# Patient Record
Sex: Male | Born: 1940 | Race: Black or African American | Hispanic: No | Marital: Married | Smoking: Former smoker
Health system: Southern US, Community
[De-identification: ages and names within clinical notes are randomized; demographics above are authoritative.]

## PROBLEM LIST (undated history)

## (undated) DIAGNOSIS — Z87442 Personal history of urinary calculi: Secondary | ICD-10-CM

## (undated) DIAGNOSIS — I10 Essential (primary) hypertension: Secondary | ICD-10-CM

## (undated) DIAGNOSIS — M1990 Unspecified osteoarthritis, unspecified site: Secondary | ICD-10-CM

## (undated) DIAGNOSIS — C801 Malignant (primary) neoplasm, unspecified: Secondary | ICD-10-CM

## (undated) DIAGNOSIS — J189 Pneumonia, unspecified organism: Secondary | ICD-10-CM

## (undated) HISTORY — PX: PROSTATECTOMY: SHX69

---

## 2000-11-24 ENCOUNTER — Other Ambulatory Visit: Admission: RE | Admit: 2000-11-24 | Discharge: 2000-11-24 | Payer: Self-pay | Admitting: *Deleted

## 2015-07-05 ENCOUNTER — Emergency Department (HOSPITAL_BASED_OUTPATIENT_CLINIC_OR_DEPARTMENT_OTHER)
Admission: EM | Admit: 2015-07-05 | Discharge: 2015-07-05 | Disposition: A | Discharge status: Home / Self Care | Payer: Medicare Other | Attending: Emergency Medicine | Admitting: Emergency Medicine

## 2015-07-05 ENCOUNTER — Encounter (HOSPITAL_BASED_OUTPATIENT_CLINIC_OR_DEPARTMENT_OTHER): Payer: Self-pay | Admitting: *Deleted

## 2015-07-05 ENCOUNTER — Emergency Department (HOSPITAL_BASED_OUTPATIENT_CLINIC_OR_DEPARTMENT_OTHER): Payer: Medicare Other

## 2015-07-05 DIAGNOSIS — Z792 Long term (current) use of antibiotics: Secondary | ICD-10-CM | POA: Diagnosis not present

## 2015-07-05 DIAGNOSIS — Z79899 Other long term (current) drug therapy: Secondary | ICD-10-CM | POA: Insufficient documentation

## 2015-07-05 DIAGNOSIS — Z87891 Personal history of nicotine dependence: Secondary | ICD-10-CM | POA: Diagnosis not present

## 2015-07-05 DIAGNOSIS — J9811 Atelectasis: Secondary | ICD-10-CM | POA: Diagnosis not present

## 2015-07-05 DIAGNOSIS — R42 Dizziness and giddiness: Secondary | ICD-10-CM | POA: Diagnosis not present

## 2015-07-05 DIAGNOSIS — J690 Pneumonitis due to inhalation of food and vomit: Secondary | ICD-10-CM | POA: Diagnosis not present

## 2015-07-05 DIAGNOSIS — I10 Essential (primary) hypertension: Secondary | ICD-10-CM | POA: Insufficient documentation

## 2015-07-05 DIAGNOSIS — D72829 Elevated white blood cell count, unspecified: Secondary | ICD-10-CM | POA: Insufficient documentation

## 2015-07-05 DIAGNOSIS — R0602 Shortness of breath: Secondary | ICD-10-CM | POA: Diagnosis present

## 2015-07-05 HISTORY — DX: Essential (primary) hypertension: I10

## 2015-07-05 LAB — CBC WITH DIFFERENTIAL/PLATELET
Basophils Absolute: 0 10*3/uL (ref 0.0–0.1)
Basophils Relative: 0 %
EOS PCT: 0 %
Eosinophils Absolute: 0 10*3/uL (ref 0.0–0.7)
HEMATOCRIT: 43.3 % (ref 39.0–52.0)
Hemoglobin: 14.5 g/dL (ref 13.0–17.0)
Lymphocytes Relative: 13 %
Lymphs Abs: 2.8 10*3/uL (ref 0.7–4.0)
MCH: 27.2 pg (ref 26.0–34.0)
MCHC: 33.5 g/dL (ref 30.0–36.0)
MCV: 81.1 fL (ref 78.0–100.0)
MONO ABS: 1.1 10*3/uL — AB (ref 0.1–1.0)
Monocytes Relative: 5 %
NEUTROS ABS: 17.3 10*3/uL — AB (ref 1.7–7.7)
Neutrophils Relative %: 82 %
Platelets: 284 10*3/uL (ref 150–400)
RBC: 5.34 MIL/uL (ref 4.22–5.81)
RDW: 14.9 % (ref 11.5–15.5)
WBC: 21.2 10*3/uL — AB (ref 4.0–10.5)

## 2015-07-05 LAB — COMPREHENSIVE METABOLIC PANEL
ALT: 71 U/L — AB (ref 17–63)
AST: 50 U/L — AB (ref 15–41)
Albumin: 3.1 g/dL — ABNORMAL LOW (ref 3.5–5.0)
Alkaline Phosphatase: 155 U/L — ABNORMAL HIGH (ref 38–126)
Anion gap: 10 (ref 5–15)
BILIRUBIN TOTAL: 1.1 mg/dL (ref 0.3–1.2)
BUN: 14 mg/dL (ref 6–20)
CHLORIDE: 100 mmol/L — AB (ref 101–111)
CO2: 22 mmol/L (ref 22–32)
CREATININE: 1.41 mg/dL — AB (ref 0.61–1.24)
Calcium: 8.5 mg/dL — ABNORMAL LOW (ref 8.9–10.3)
GFR calc Af Amer: 56 mL/min — ABNORMAL LOW (ref >60)
GFR, EST NON AFRICAN AMERICAN: 48 mL/min — AB (ref >60)
Glucose, Bld: 137 mg/dL — ABNORMAL HIGH (ref 65–99)
Potassium: 4.3 mmol/L (ref 3.5–5.1)
Sodium: 132 mmol/L — ABNORMAL LOW (ref 135–145)
Total Protein: 8.1 g/dL (ref 6.5–8.1)

## 2015-07-05 LAB — URINALYSIS, ROUTINE W REFLEX MICROSCOPIC
Glucose, UA: NEGATIVE mg/dL
KETONES UR: 15 mg/dL — AB
NITRITE: POSITIVE — AB
Protein, ur: >300 mg/dL — AB
Specific Gravity, Urine: 1.031 — ABNORMAL HIGH (ref 1.005–1.030)
pH: 5.5 (ref 5.0–8.0)

## 2015-07-05 LAB — URINE MICROSCOPIC-ADD ON

## 2015-07-05 LAB — I-STAT CG4 LACTIC ACID, ED: LACTIC ACID, VENOUS: 1.91 mmol/L (ref 0.5–2.0)

## 2015-07-05 MED ORDER — SODIUM CHLORIDE 0.9 % IV BOLUS (SEPSIS)
1000.0000 mL | Freq: Once | INTRAVENOUS | Status: AC
Start: 2015-07-05 — End: 2015-07-05
  Administered 2015-07-05: 1000 mL via INTRAVENOUS

## 2015-07-05 MED ORDER — CEFTRIAXONE SODIUM 1 G IJ SOLR
INTRAMUSCULAR | Status: AC
  Filled 2015-07-05: qty 10

## 2015-07-05 MED ORDER — ACETAMINOPHEN 500 MG PO TABS
1000.0000 mg | ORAL_TABLET | Freq: Once | ORAL | Status: DC
  Filled 2015-07-05: qty 2

## 2015-07-05 MED ORDER — DEXTROSE 5 % IV SOLN
1.0000 g | Freq: Once | INTRAVENOUS | Status: AC
  Administered 2015-07-05: 1 g via INTRAVENOUS

## 2015-07-05 MED ORDER — IBUPROFEN 400 MG PO TABS
400.0000 mg | ORAL_TABLET | Freq: Once | ORAL | Status: AC
  Administered 2015-07-05: 400 mg via ORAL
  Filled 2015-07-05: qty 1

## 2015-07-05 MED ORDER — AZITHROMYCIN 250 MG PO TABS
500.0000 mg | ORAL_TABLET | Freq: Once | ORAL | Status: AC
  Administered 2015-07-05: 500 mg via ORAL
  Filled 2015-07-05: qty 2

## 2015-07-05 MED ORDER — SODIUM CHLORIDE 0.9 % IV BOLUS (SEPSIS)
1000.0000 mL | Freq: Once | INTRAVENOUS | Status: AC
  Administered 2015-07-05: 1000 mL via INTRAVENOUS

## 2015-07-05 MED ORDER — ALBUTEROL SULFATE (2.5 MG/3ML) 0.083% IN NEBU
2.5000 mg | INHALATION_SOLUTION | RESPIRATORY_TRACT | Status: DC | PRN
  Administered 2015-07-05: 2.5 mg via RESPIRATORY_TRACT
  Filled 2015-07-05: qty 3

## 2015-07-05 MED ORDER — CEFUROXIME AXETIL 500 MG PO TABS: 500.0000 mg | ORAL_TABLET | Freq: Two times a day (BID) | ORAL | Status: DC

## 2015-07-05 MED ORDER — ACETAMINOPHEN 500 MG PO TABS
1000.0000 mg | ORAL_TABLET | Freq: Once | ORAL | Status: AC
  Administered 2015-07-05: 1000 mg via ORAL
  Filled 2015-07-05: qty 2

## 2015-07-05 NOTE — ED Notes (Signed)
MD at bedside. 

## 2015-07-05 NOTE — ED Provider Notes (Signed)
1:19 AM Assumed care from Dr. Jeneen Rinks, please see their note for full history, physical and decision making until this point. In brief this is a 74 y.o. year old male who presented to the ED tonight with Shortness of Breath; Dizziness; and Fever     74 yo M here with likely pneumonia, started z pack yesterday. Here technically septic but overall appears well. On my exam, has diminished breath sounds, tachycardia, tachypnea. Wbc of 21.2. XR worse than  Yesterday but patient appears comofortable. Will await fluids, abx, breathing treatments and reassess for disposition as patient adamantly wants to go home tonight.   UA with possible infection. Planned to add Ceftin to his antibiotic regimen anyway so that should cover for UTI. Patient with continued improvement in his heart rate and respiratory rate while in the emergency department. Patient and family both state the patient appears and is acting better than arrival. He still wants to go home. Discussed that I would prefer admission secondary to still have a heart rate above 100 and respiratory rate about 20 however he and his family preferred to go home. I discussed going to see his primary doctor tomorrow for recheck of vital signs however if they were closed he could return here.   Discharge instructions, including strict return precautions for new or worsening symptoms, given. Patient verbalized understanding and agreement with the plan as described.   Labs, studies and imaging reviewed by myself and considered in medical decision making if ordered. Imaging interpreted by radiology.  Labs Reviewed  COMPREHENSIVE METABOLIC PANEL - Abnormal; Notable for the following:    Sodium 132 (*)    Chloride 100 (*)    Glucose, Bld 137 (*)    Creatinine, Ser 1.41 (*)    Calcium 8.5 (*)    Albumin 3.1 (*)    AST 50 (*)    ALT 71 (*)    Alkaline Phosphatase 155 (*)    GFR calc non Af Amer 48 (*)    GFR calc Af Amer 56 (*)    All other components within  normal limits  CBC WITH DIFFERENTIAL/PLATELET - Abnormal; Notable for the following:    WBC 21.2 (*)    Neutro Abs 17.3 (*)    Monocytes Absolute 1.1 (*)    All other components within normal limits  URINALYSIS, ROUTINE W REFLEX MICROSCOPIC (NOT AT [facility]) - Abnormal; Notable for the following:    Color, Urine ORANGE (*)    APPearance CLOUDY (*)    Specific Gravity, Urine 1.031 (*)    Hgb urine dipstick LARGE (*)    Bilirubin Urine SMALL (*)    Ketones, ur 15 (*)    Protein, ur >300 (*)    Nitrite POSITIVE (*)    Leukocytes, UA SMALL (*)    All other components within normal limits  URINE MICROSCOPIC-ADD ON - Abnormal; Notable for the following:    Squamous Epithelial / LPF 6-30 (*)    Bacteria, UA FEW (*)    All other components within normal limits  CULTURE, BLOOD (ROUTINE X 2)  CULTURE, BLOOD (ROUTINE X 2)  URINE CULTURE  I-STAT CG4 LACTIC ACID, ED    DG Chest 2 View  Final Result      No Follow-up on file.   Merrily Pew, MD 07/06/15 [phone]

## 2015-07-05 NOTE — ED Notes (Signed)
Patient transported to X-ray via stretcher per tech. 

## 2015-07-05 NOTE — ED Notes (Signed)
MD at bedside examining pt and discussing plan of care with pt and wife.

## 2015-07-05 NOTE — ED Notes (Signed)
Sob and dizziness. He was seen by his MD yesterday for SOB and given Albuterol inhaler.

## 2015-07-05 NOTE — ED Notes (Signed)
MD has spoken with pt and family at length about dispo options.  Family and pt has decided that they want to take pt home and not be admitted. Pt and family are aware that pt can be admitted at this time but they decline that option.  They state they will f/u with pts pmd within 2 days and if pt gets any worse they will take him back to an Ed.

## 2015-07-05 NOTE — ED Notes (Signed)
MD at bedside discussing test results and options for plan of care.

## 2015-07-07 ENCOUNTER — Encounter (HOSPITAL_BASED_OUTPATIENT_CLINIC_OR_DEPARTMENT_OTHER): Payer: Self-pay | Admitting: Emergency Medicine

## 2015-07-07 ENCOUNTER — Emergency Department (HOSPITAL_BASED_OUTPATIENT_CLINIC_OR_DEPARTMENT_OTHER)
Admission: EM | Admit: 2015-07-07 | Discharge: 2015-07-07 | Disposition: A | Discharge status: Home / Self Care | Payer: Medicare Other | Attending: Emergency Medicine | Admitting: Emergency Medicine

## 2015-07-07 DIAGNOSIS — R630 Anorexia: Secondary | ICD-10-CM | POA: Insufficient documentation

## 2015-07-07 DIAGNOSIS — I10 Essential (primary) hypertension: Secondary | ICD-10-CM | POA: Insufficient documentation

## 2015-07-07 DIAGNOSIS — R066 Hiccough: Secondary | ICD-10-CM | POA: Diagnosis not present

## 2015-07-07 DIAGNOSIS — R112 Nausea with vomiting, unspecified: Secondary | ICD-10-CM | POA: Insufficient documentation

## 2015-07-07 DIAGNOSIS — J159 Unspecified bacterial pneumonia: Secondary | ICD-10-CM | POA: Insufficient documentation

## 2015-07-07 DIAGNOSIS — Z792 Long term (current) use of antibiotics: Secondary | ICD-10-CM | POA: Insufficient documentation

## 2015-07-07 DIAGNOSIS — J189 Pneumonia, unspecified organism: Secondary | ICD-10-CM

## 2015-07-07 DIAGNOSIS — R05 Cough: Secondary | ICD-10-CM | POA: Diagnosis present

## 2015-07-07 DIAGNOSIS — Z79899 Other long term (current) drug therapy: Secondary | ICD-10-CM | POA: Diagnosis not present

## 2015-07-07 DIAGNOSIS — Z87891 Personal history of nicotine dependence: Secondary | ICD-10-CM | POA: Insufficient documentation

## 2015-07-07 LAB — URINE CULTURE

## 2015-07-07 NOTE — ED Notes (Signed)
Pt seen last week and told to come back to er for recheck of heart rate and respiratory rate

## 2015-07-07 NOTE — Discharge Instructions (Signed)
Community-Acquired Pneumonia, Adult Pneumonia is an infection of the lungs. One type of pneumonia can happen while a person is in a hospital. A different type can happen when a person is not in a hospital (community-acquired pneumonia). It is easy for this kind to spread from person to person. It can spread to you if you breathe near an infected person who coughs or sneezes. Some symptoms include:  A dry cough.  A wet (productive) cough.  Fever.  Sweating.  Chest pain. HOME CARE  Take over-the-counter and prescription medicines only as told by your doctor.  Only take cough medicine if you are losing sleep.  If you were prescribed an antibiotic medicine, take it as told by your doctor. Do not stop taking the antibiotic even if you start to feel better.  Sleep with your head and neck raised (elevated). You can do this by putting a few pillows under your head, or you can sleep in a recliner.  Do not use tobacco products. These include cigarettes, chewing tobacco, and e-cigarettes. If you need help quitting, ask your doctor.  Drink enough water to keep your pee (urine) clear or pale yellow. A shot (vaccine) can help prevent pneumonia. Shots are often suggested for:  People older than 73 years of age.  People older than 73 years of age:  Who are having cancer treatment.  Who have long-term (chronic) lung disease.  Who have problems with their body's defense system (immune system). You may also prevent pneumonia if you take these actions:  Get the flu (influenza) shot every year.  Go to the dentist as often as told.  Wash your hands often. If soap and water are not available, use hand sanitizer. GET HELP IF:  You have a fever.  You lose sleep because your cough medicine does not help. GET HELP RIGHT AWAY IF:  You are short of breath and it gets worse.  You have more chest pain.  Your sickness gets worse. This is very serious if:  You are an older adult.  Your  body's defense system is weak.  You cough up blood.   This information is not intended to replace advice given to you by your health care provider. Make sure you discuss any questions you have with your health care provider.   Document Released: 01/14/2008 Document Revised: 04/18/2015 Document Reviewed: 11/22/2014 Elsevier Interactive Patient Education 2016 [employer].  Hiccups A hiccup is the result of a sudden shortening of the muscle below your lungs (diaphragm). This movement of your diaphragm causes a sudden inhalation followed by the closing of your vocal cords, which causes the hiccup sound. Most people get the hiccups. Typically, hiccups last only a short amount of time.  There are three types of hiccups:   Benign. These hiccups last less than 48 hours.   Persistent. These hiccups last more than 48 hours, but less than 1 month.   Intractable. These hiccups last more than 1 month.  A hiccup is a reflex. You cannot control reflexes.  HOME CARE INSTRUCTIONS  Watch your hiccups for any changes. The following actions may help to lessen any discomfort that you are feeling:  Eat small meals.   Limit alcohol intake to no more than 1 drink per day for nonpregnant women and 2 drinks per day for men. One drink equals 12 oz of beer, 5 oz of wine, or 1 oz of hard liquor.  Limit drinking carbonated or fizzy drinks, such as soda.  Eat and chew your  food slowly.   Avoid eating or drinking hot or spicy foods and drinks.  Take medicines only as directed by your health care provider.  SEEK MEDICAL CARE IF:   Your hiccups last for more than 48 hours.   Your hiccups do not improve with treatment.  You cannot sleep or eat due to the hiccups.   You have unexpected weight loss due to the hiccups.   You have a fever.   You have trouble breathing or swallowing.   You develop severe pain in your abdomen.  You develop numbness, tingling, or weakness.   This information  is not intended to replace advice given to you by your health care provider. Make sure you discuss any questions you have with your health care provider.   Document Released: 10/06/2001 Document Revised: 12/12/2014 Document Reviewed: 07/24/2014 Elsevier Interactive Patient Education [employer].

## 2015-07-07 NOTE — ED Notes (Addendum)
Per pt's family( wife and daughter) they are concern that he is not eating enough , has upset stomach and loose stools. Pt laying in bed with no distress at present time, reports only having hiccups.  Was started on antibiotics when last here and reports that they are trying to encourage him to eat. Reports back to ED because the EDP that seen him requested that he come back for reevaluation of his HR, RR.

## 2015-07-07 NOTE — ED Provider Notes (Signed)
CSN: [id number]     Arrival date & time 07/07/15  1117 History   First MD Initiated Contact with Patient 07/07/15 1156     Chief Complaint  Patient presents with  . Follow-up     (Consider location/radiation/quality/duration/timing/severity/associated sxs/prior Treatment) HPI Comments: Improving cough, SOB--much improved from prior No congestion No CP No fevers since returning home Nausea, decreased appetite, loose stool Taking cefitin, azithromycin Occasional emesis, from coughing Hiccups several days, off and on    Past Medical History  Diagnosis Date  . Hypertension    Past Surgical History  Procedure Laterality Date  . Prostatectomy     History reviewed. No pertinent family history. Social History  Substance Use Topics  . Smoking status: Former Research scientist (life sciences)  . Smokeless tobacco: None  . Alcohol Use: No    Review of Systems  Constitutional: Positive for appetite change. Negative for fever.  HENT: Negative for sore throat.   Eyes: Negative for visual disturbance.  Respiratory: Positive for cough and shortness of breath (mild, much improved ).   Cardiovascular: Negative for chest pain.  Gastrointestinal: Positive for nausea and vomiting (with coughing however now that coughing has decreased, not occuring). Negative for abdominal pain and diarrhea (loose stool).  Genitourinary: Negative for difficulty urinating.  Musculoskeletal: Negative for back pain and neck stiffness.  Skin: Negative for rash.  Neurological: Negative for syncope and headaches.      Allergies  Review of patient's allergies indicates no known allergies.  Home Medications   Prior to Admission medications   Medication Sig Start Date End Date Taking? Authorizing Provider  ALBUTEROL IN Inhale into the lungs.   Yes Historical Provider, MD  cefUROXime (CEFTIN) 500 MG tablet Take 1 tablet (500 mg total) by mouth 2 (two) times daily with a meal. 07/06/15  Yes Merrily Pew, MD  azithromycin  (ZITHROMAX) 250 MG tablet Take by mouth daily.    Historical Provider, MD  QUINAPRIL HCL PO Take by mouth.    Historical Provider, MD   BP 130/88 mmHg  Pulse 112  Temp(Src) 98.4 F (36.9 C) (Oral)  Resp 20  Ht 5\' 11"  (1.803 m)  Wt 268 lb (121.564 kg)  BMI 37.39 kg/m2  SpO2 96% Physical Exam  Constitutional: He is oriented to person, place, and time. He appears well-developed and well-nourished. No distress.  hiccups  HENT:  Head: Normocephalic and atraumatic.  Eyes: Conjunctivae and EOM are normal.  Neck: Normal range of motion.  Cardiovascular: Normal rate, regular rhythm, normal heart sounds and intact distal pulses.  Exam reveals no gallop and no friction rub.   No murmur heard. Pulmonary/Chest: Effort normal and breath sounds normal. No respiratory distress. He has no wheezes. He has no rales.  Abdominal: Soft. He exhibits no distension. There is no tenderness. There is no guarding.  Musculoskeletal: He exhibits no edema.  Neurological: He is alert and oriented to person, place, and time.  Skin: Skin is warm and dry. He is not diaphoretic.  Nursing note and vitals reviewed.   ED Course  Procedures (including critical care time) Labs Review Labs Reviewed - No data to display  Imaging Review No results found. I have personally reviewed and evaluated these images and lab results as part of my medical decision-making.   EKG Interpretation None      MDM   Final diagnoses:  CAP (community acquired pneumonia)  Hiccups   74yo male with hx of htn, recent ED evaluation 11/24 with concern for cough, dyspnea, leukocytosis, likely pneumonia and  ceftin added to azithromycin. Pt with tachycardia and tachypnea however wanted to be discharged, and EDP recommended PCP follow up or return to ED.  Patient overall feels he is improving, with improving SOB, cough.  He has some nausea, loose stool, likely secondary to abx and decreased appetite. Tachycardia today likely secondary to  mild dehydration from decreased appetite.  Given leukocytosis, improvement of symptoms with abx, feel hx is more consistent with infxn than other cause of dyspnea (PE/ACS.)  Pt with hiccups, recommend continued conservative measures.  Given improvement with abx, do not feel pt requires additional labwork or imaging today. Patient discharged in stable condition with understanding of reasons to return.     Gareth Morgan, MD 07/07/15 2211

## 2015-07-10 LAB — CULTURE, BLOOD (ROUTINE X 2)
CULTURE: NO GROWTH
Culture: NO GROWTH

## 2015-07-26 NOTE — ED Provider Notes (Addendum)
CSN: [id number]     Arrival date & time 07/05/15  1409 History   First MD Initiated Contact with Patient 07/05/15 1425     Chief Complaint  Patient presents with  . Shortness of Breath  . Dizziness  . Fever    HPI  Patient presents for evaluation with a complaint of shortness of breath over the last several days. Was placed on Zovirax by his primary care physician 1 day prior. Continues with cough shortness of breath and fever at home. No chest pain. No nausea or vomiting. Claims a generalized weakness.  Past Medical History  Diagnosis Date  . Hypertension    Past Surgical History  Procedure Laterality Date  . Prostatectomy     No family history on file. Social History  Substance Use Topics  . Smoking status: Former Research scientist (life sciences)  . Smokeless tobacco: None  . Alcohol Use: No    Review of Systems  Constitutional: Positive for fever and fatigue. Negative for chills, diaphoresis and appetite change.  HENT: Negative for mouth sores, sore throat and trouble swallowing.   Eyes: Negative for visual disturbance.  Respiratory: Positive for cough and shortness of breath. Negative for chest tightness and wheezing.   Cardiovascular: Negative for chest pain.  Gastrointestinal: Negative for nausea, vomiting, abdominal pain, diarrhea and abdominal distention.  Endocrine: Negative for polydipsia, polyphagia and polyuria.  Genitourinary: Negative for dysuria, frequency and hematuria.  Musculoskeletal: Negative for gait problem.  Skin: Negative for color change, pallor and rash.  Neurological: Negative for dizziness, syncope, light-headedness and headaches.  Hematological: Does not bruise/bleed easily.  Psychiatric/Behavioral: Negative for behavioral problems and confusion.      Allergies  Review of patient's allergies indicates no known allergies.  Home Medications   Prior to Admission medications   Medication Sig Start Date End Date Taking? Authorizing Provider  ALBUTEROL IN Inhale  into the lungs.   Yes Historical Provider, MD  azithromycin (ZITHROMAX) 250 MG tablet Take by mouth daily.   Yes Historical Provider, MD  QUINAPRIL HCL PO Take by mouth.   Yes Historical Provider, MD  cefUROXime (CEFTIN) 500 MG tablet Take 1 tablet (500 mg total) by mouth 2 (two) times daily with a meal. 07/06/15   Merrily Pew, MD   BP 122/81 mmHg  Pulse 110  Temp(Src) 99.6 F (37.6 C) (Oral)  Resp 28  Ht 5' 11.5" (1.816 m)  Wt 268 lb (121.564 kg)  BMI 36.86 kg/m2  SpO2 96% Physical Exam  Constitutional: He is oriented to person, place, and time. He appears well-developed and well-nourished. No distress.  HENT:  Head: Normocephalic.  Eyes: Conjunctivae are normal. Pupils are equal, round, and reactive to light. No scleral icterus.  Neck: Normal range of motion. Neck supple. No thyromegaly present.  Cardiovascular: Normal rate and regular rhythm.  Exam reveals no gallop and no friction rub.   No murmur heard. Pulmonary/Chest: Effort normal and breath sounds normal. No respiratory distress. He has no wheezes. He has no rales.  Globally rhonchorous on room air exam. No wheezing or prolongation.  Abdominal: Soft. Bowel sounds are normal. He exhibits no distension. There is no tenderness. There is no rebound.  Musculoskeletal: Normal range of motion.  Neurological: He is alert and oriented to person, place, and time.  Skin: Skin is warm and dry. No rash noted.  Psychiatric: He has a normal mood and affect. His behavior is normal.    ED Course  Procedures (including critical care time) Labs Review Labs Reviewed  COMPREHENSIVE METABOLIC  PANEL - Abnormal; Notable for the following:    Sodium 132 (*)    Chloride 100 (*)    Glucose, Bld 137 (*)    Creatinine, Ser 1.41 (*)    Calcium 8.5 (*)    Albumin 3.1 (*)    AST 50 (*)    ALT 71 (*)    Alkaline Phosphatase 155 (*)    GFR calc non Af Amer 48 (*)    GFR calc Af Amer 56 (*)    All other components within normal limits  CBC  WITH DIFFERENTIAL/PLATELET - Abnormal; Notable for the following:    WBC 21.2 (*)    Neutro Abs 17.3 (*)    Monocytes Absolute 1.1 (*)    All other components within normal limits  URINALYSIS, ROUTINE W REFLEX MICROSCOPIC (NOT AT [facility]) - Abnormal; Notable for the following:    Color, Urine ORANGE (*)    APPearance CLOUDY (*)    Specific Gravity, Urine 1.031 (*)    Hgb urine dipstick LARGE (*)    Bilirubin Urine SMALL (*)    Ketones, ur 15 (*)    Protein, ur >300 (*)    Nitrite POSITIVE (*)    Leukocytes, UA SMALL (*)    All other components within normal limits  URINE MICROSCOPIC-ADD ON - Abnormal; Notable for the following:    Squamous Epithelial / LPF 6-30 (*)    Bacteria, UA FEW (*)    All other components within normal limits  CULTURE, BLOOD (ROUTINE X 2)  CULTURE, BLOOD (ROUTINE X 2)  URINE CULTURE  I-STAT CG4 LACTIC ACID, ED    Imaging Review No results found. I have personally reviewed and evaluated these images and lab results as part of my medical decision-making.   EKG Interpretation   Date/Time:  Thursday July 05 2015 14:14:31 EST Ventricular Rate:  132 PR Interval:  142 QRS Duration: 70 QT Interval:  280 QTC Calculation: 414 R Axis:   3 Text Interpretation:  Sinus tachycardia with occasional Premature  ventricular complexes Low voltage QRS Borderline ECG ED PHYSICIAN  INTERPRETATION AVAILABLE IN [facility] Confirmed by TEST, Record  ([id number]) on 07/06/2015 7:20:11 AM      MDM   Final diagnoses:  Aspiration pneumonia, unspecified aspiration pneumonia type, unspecified laterality, unspecified part of lung ([place])    Patient with bibasilar atelectasis. Leukocytosis. Urine with signs of infection. Please see Dr. Evelina Dun note regarding ultimate disposition of the patient. Patient and family have expressed a strong desire to not be admitted to the hospital.    Tanna Furry, MD 07/26/15 [place], MD 07/28/15 [phone]

## 2015-08-21 ENCOUNTER — Ambulatory Visit: Payer: Self-pay | Admitting: Orthopedic Surgery

## 2015-08-21 NOTE — Progress Notes (Signed)
Preoperative surgical orders have been place into the Epic hospital system for Marylouise Stacks on 08/21/2015, 9:35 PM  by Mickel Crow for surgery on 09/10/2015.  Preop Total Knee orders including Experal, IV Tylenol, and IV Decadron as long as there are no contraindications to the above medications. Arlee Muslim, PA-C

## 2015-08-30 NOTE — Patient Instructions (Signed)
Hadley Pen  08/30/2015   Your procedure is scheduled on: 09/10/2015    Report to [facility] Main  Entrance take [facility]  elevators to 3rd floor to  [place] at    1150AM.  Call this number if you have problems the morning of surgery [phone]   Remember: ONLY 1 PERSON MAY GO WITH YOU TO SHORT STAY TO GET  READY MORNING OF YOUR SURGERY.  Do not eat food after midnite.  May have clear liquids from 12 midnite until 0730am then nothing by mouth.       Take these medicines the morning of surgery with A SIP OF WATER: Albuterol Inhaler if needed and bring, Amlodipine ( Norvasc)                                You may not have any metal on your body including hair pins and              piercings  Do not wear jewelry, , lotions, powders or perfumes, deodorant                        Men may shave face and neck.   Do not bring valuables to the hospital. [place].  Contacts, dentures or bridgework may not be worn into surgery.  Leave suitcase in the car. After surgery it may be brought to your room.     Special Instructions: coughing and deep breathing exercises, leg exercises               Please read over the following fact sheets you were given: _____________________________________________________________________             [facility] - Preparing for Surgery Before surgery, you can play an important role.  Because skin is not sterile, your skin needs to be as free of germs as possible.  You can reduce the number of germs on your skin by washing with CHG (chlorahexidine gluconate) soap before surgery.  CHG is an antiseptic cleaner which kills germs and bonds with the skin to continue killing germs even after washing. Please DO NOT use if you have an allergy to CHG or antibacterial soaps.  If your skin becomes reddened/irritated stop using the CHG and inform your nurse when you arrive at Short Stay. Do  not shave (including legs and underarms) for at least 48 hours prior to the first CHG shower.  You may shave your face/neck. Please follow these instructions carefully:  1.  Shower with CHG Soap the night before surgery and the  morning of Surgery.  2.  If you choose to wash your hair, wash your hair first as usual with your  normal  shampoo.  3.  After you shampoo, rinse your hair and body thoroughly to remove the  shampoo.                           4.  Use CHG as you would any other liquid soap.  You can apply chg directly  to the skin and wash  Gently with a scrungie or clean washcloth.  5.  Apply the CHG Soap to your body ONLY FROM THE NECK DOWN.   Do not use on face/ open                           Wound or open sores. Avoid contact with eyes, ears mouth and genitals (private parts).                       Wash face,  Genitals (private parts) with your normal soap.             6.  Wash thoroughly, paying special attention to the area where your surgery  will be performed.  7.  Thoroughly rinse your body with warm water from the neck down.  8.  DO NOT shower/wash with your normal soap after using and rinsing off  the CHG Soap.                9.  Pat yourself dry with a clean towel.            10.  Wear clean pajamas.            11.  Place clean sheets on your bed the night of your first shower and do not  sleep with pets. Day of Surgery : Do not apply any lotions/deodorants the morning of surgery.  Please wear clean clothes to the hospital/surgery center.  FAILURE TO FOLLOW THESE INSTRUCTIONS MAY RESULT IN THE CANCELLATION OF YOUR SURGERY PATIENT SIGNATURE_________________________________  NURSE SIGNATURE__________________________________  ________________________________________________________________________  WHAT IS A BLOOD TRANSFUSION? Blood Transfusion Information  A transfusion is the replacement of blood or some of its parts. Blood is made up of multiple  cells which provide different functions.  Red blood cells carry oxygen and are used for blood loss replacement.  White blood cells fight against infection.  Platelets control bleeding.  Plasma helps clot blood.  Other blood products are available for specialized needs, such as hemophilia or other clotting disorders. BEFORE THE TRANSFUSION  Who gives blood for transfusions?   Healthy volunteers who are fully evaluated to make sure their blood is safe. This is blood bank blood. Transfusion therapy is the safest it has ever been in the practice of medicine. Before blood is taken from a donor, a complete history is taken to make sure that person has no history of diseases nor engages in risky social behavior (examples are intravenous drug use or sexual activity with multiple partners). The donor's travel history is screened to minimize risk of transmitting infections, such as malaria. The donated blood is tested for signs of infectious diseases, such as HIV and hepatitis. The blood is then tested to be sure it is compatible with you in order to minimize the chance of a transfusion reaction. If you or a relative donates blood, this is often done in anticipation of surgery and is not appropriate for emergency situations. It takes many days to process the donated blood. RISKS AND COMPLICATIONS Although transfusion therapy is very safe and saves many lives, the main dangers of transfusion include:  1. Getting an infectious disease. 2. Developing a transfusion reaction. This is an allergic reaction to something in the blood you were given. Every precaution is taken to prevent this. The decision to have a blood transfusion has been considered carefully by your caregiver before blood is given. Blood is not given unless the benefits outweigh  the risks. AFTER THE TRANSFUSION  Right after receiving a blood transfusion, you will usually feel much better and more energetic. This is especially true if your red  blood cells have gotten low (anemic). The transfusion raises the level of the red blood cells which carry oxygen, and this usually causes an energy increase.  The nurse administering the transfusion will monitor you carefully for complications. HOME CARE INSTRUCTIONS  No special instructions are needed after a transfusion. You may find your energy is better. Speak with your caregiver about any limitations on activity for underlying diseases you may have. SEEK MEDICAL CARE IF:   Your condition is not improving after your transfusion.  You develop redness or irritation at the intravenous (IV) site. SEEK IMMEDIATE MEDICAL CARE IF:  Any of the following symptoms occur over the next 12 hours:  Shaking chills.  You have a temperature by mouth above 102 F (38.9 C), not controlled by medicine.  Chest, back, or muscle pain.  People around you feel you are not acting correctly or are confused.  Shortness of breath or difficulty breathing.  Dizziness and fainting.  You get a rash or develop hives.  You have a decrease in urine output.  Your urine turns a dark color or changes to pink, red, or brown. Any of the following symptoms occur over the next 10 days:  You have a temperature by mouth above 102 F (38.9 C), not controlled by medicine.  Shortness of breath.  Weakness after normal activity.  The white part of the eye turns yellow (jaundice).  You have a decrease in the amount of urine or are urinating less often.  Your urine turns a dark color or changes to pink, red, or brown. Document Released: 07/25/2000 Document Revised: 10/20/2011 Document Reviewed: 03/13/2008 ExitCare Patient Information 2014 [place].  _______________________________________________________________________  Incentive Spirometer  An incentive spirometer is a tool that can help keep your lungs clear and active. This tool measures how well you are filling your lungs with each breath. Taking  long deep breaths may help reverse or decrease the chance of developing breathing (pulmonary) problems (especially infection) following:  A long period of time when you are unable to move or be active. BEFORE THE PROCEDURE   If the spirometer includes an indicator to show your best effort, your nurse or respiratory therapist will set it to a desired goal.  If possible, sit up straight or lean slightly forward. Try not to slouch.  Hold the incentive spirometer in an upright position. INSTRUCTIONS FOR USE  3. Sit on the edge of your bed if possible, or sit up as far as you can in bed or on a chair. 4. Hold the incentive spirometer in an upright position. 5. Breathe out normally. 6. Place the mouthpiece in your mouth and seal your lips tightly around it. 7. Breathe in slowly and as deeply as possible, raising the piston or the ball toward the top of the column. 8. Hold your breath for 3-5 seconds or for as long as possible. Allow the piston or ball to fall to the bottom of the column. 9. Remove the mouthpiece from your mouth and breathe out normally. 10. Rest for a few seconds and repeat Steps 1 through 7 at least 10 times every 1-2 hours when you are awake. Take your time and take a few normal breaths between deep breaths. 11. The spirometer may include an indicator to show your best effort. Use the indicator as a goal to work  toward during each repetition. 12. After each set of 10 deep breaths, practice coughing to be sure your lungs are clear. If you have an incision (the cut made at the time of surgery), support your incision when coughing by placing a pillow or rolled up towels firmly against it. Once you are able to get out of bed, walk around indoors and cough well. You may stop using the incentive spirometer when instructed by your caregiver.  RISKS AND COMPLICATIONS  Take your time so you do not get dizzy or light-headed.  If you are in pain, you may need to take or ask for pain  medication before doing incentive spirometry. It is harder to take a deep breath if you are having pain. AFTER USE  Rest and breathe slowly and easily.  It can be helpful to keep track of a log of your progress. Your caregiver can provide you with a simple table to help with this. If you are using the spirometer at home, follow these instructions: [place] IF:   You are having difficultly using the spirometer.  You have trouble using the spirometer as often as instructed.  Your pain medication is not giving enough relief while using the spirometer.  You develop fever of 100.5 F (38.1 C) or higher. SEEK IMMEDIATE MEDICAL CARE IF:   You cough up bloody sputum that had not been present before.  You develop fever of 102 F (38.9 C) or greater.  You develop worsening pain at or near the incision site. MAKE SURE YOU:   Understand these instructions.  Will watch your condition.  Will get help right away if you are not doing well or get worse. Document Released: 12/08/2006 Document Revised: 10/20/2011 Document Reviewed: 02/08/2007 ExitCare Patient Information 2014 ExitCare, [place].   ________________________________________________________________________    CLEAR LIQUID DIET   Foods Allowed                                                                     Foods Excluded  Coffee and tea, regular and decaf                             liquids that you cannot  Plain Jell-O in any flavor                                             see through such as: Fruit ices (not with fruit pulp)                                     milk, soups, orange juice  Iced Popsicles                                    All solid food Carbonated beverages, regular and diet  Cranberry, grape and apple juices Sports drinks like Gatorade Lightly seasoned clear broth or consume(fat free) Sugar, honey syrup  Sample Menu Breakfast                                 Lunch                                     Supper Cranberry juice                    Beef broth                            Chicken broth Jell-O                                     Grape juice                           Apple juice Coffee or tea                        Jell-O                                      Popsicle                                                Coffee or tea                        Coffee or tea  _____________________________________________________________________

## 2015-09-03 ENCOUNTER — Encounter (HOSPITAL_COMMUNITY)
Admission: RE | Admit: 2015-09-03 | Discharge: 2015-09-03 | Disposition: A | Discharge status: Home / Self Care | Payer: Medicare Other | Source: Ambulatory Visit | Attending: Orthopedic Surgery | Admitting: Orthopedic Surgery

## 2015-09-03 ENCOUNTER — Encounter (HOSPITAL_COMMUNITY): Payer: Self-pay

## 2015-09-03 ENCOUNTER — Encounter (INDEPENDENT_AMBULATORY_CARE_PROVIDER_SITE_OTHER): Payer: Self-pay

## 2015-09-03 ENCOUNTER — Ambulatory Visit (HOSPITAL_COMMUNITY)
Admission: RE | Admit: 2015-09-03 | Discharge: 2015-09-03 | Disposition: A | Discharge status: Home / Self Care | Payer: Medicare Other | Source: Ambulatory Visit | Attending: Anesthesiology | Admitting: Anesthesiology

## 2015-09-03 DIAGNOSIS — I10 Essential (primary) hypertension: Secondary | ICD-10-CM | POA: Insufficient documentation

## 2015-09-03 DIAGNOSIS — Z8546 Personal history of malignant neoplasm of prostate: Secondary | ICD-10-CM | POA: Diagnosis not present

## 2015-09-03 DIAGNOSIS — Z01818 Encounter for other preprocedural examination: Secondary | ICD-10-CM | POA: Diagnosis not present

## 2015-09-03 DIAGNOSIS — Z87891 Personal history of nicotine dependence: Secondary | ICD-10-CM | POA: Diagnosis not present

## 2015-09-03 HISTORY — DX: Unspecified osteoarthritis, unspecified site: M19.90

## 2015-09-03 HISTORY — DX: Pneumonia, unspecified organism: J18.9

## 2015-09-03 HISTORY — DX: Malignant (primary) neoplasm, unspecified: C80.1

## 2015-09-03 LAB — CBC
HEMATOCRIT: 42.3 % (ref 39.0–52.0)
Hemoglobin: 13.5 g/dL (ref 13.0–17.0)
MCH: 27.5 pg (ref 26.0–34.0)
MCHC: 31.9 g/dL (ref 30.0–36.0)
MCV: 86.2 fL (ref 78.0–100.0)
PLATELETS: 244 10*3/uL (ref 150–400)
RBC: 4.91 MIL/uL (ref 4.22–5.81)
RDW: 16.2 % — AB (ref 11.5–15.5)
WBC: 5.6 10*3/uL (ref 4.0–10.5)

## 2015-09-03 LAB — URINALYSIS, ROUTINE W REFLEX MICROSCOPIC
BILIRUBIN URINE: NEGATIVE
Glucose, UA: NEGATIVE mg/dL
HGB URINE DIPSTICK: NEGATIVE
KETONES UR: NEGATIVE mg/dL
Leukocytes, UA: NEGATIVE
Nitrite: NEGATIVE
PH: 5 (ref 5.0–8.0)
Protein, ur: NEGATIVE mg/dL
SPECIFIC GRAVITY, URINE: 1.017 (ref 1.005–1.030)

## 2015-09-03 LAB — COMPREHENSIVE METABOLIC PANEL
ALBUMIN: 4.1 g/dL (ref 3.5–5.0)
ALT: 20 U/L (ref 17–63)
ANION GAP: 10 (ref 5–15)
AST: 26 U/L (ref 15–41)
Alkaline Phosphatase: 76 U/L (ref 38–126)
BILIRUBIN TOTAL: 0.4 mg/dL (ref 0.3–1.2)
BUN: 9 mg/dL (ref 6–20)
CHLORIDE: 107 mmol/L (ref 101–111)
CO2: 25 mmol/L (ref 22–32)
Calcium: 9.5 mg/dL (ref 8.9–10.3)
Creatinine, Ser: 1.18 mg/dL (ref 0.61–1.24)
GFR calc Af Amer: >60 mL/min (ref >60)
GFR calc non Af Amer: 59 mL/min — ABNORMAL LOW (ref >60)
GLUCOSE: 124 mg/dL — AB (ref 65–99)
POTASSIUM: 4.5 mmol/L (ref 3.5–5.1)
Sodium: 142 mmol/L (ref 135–145)
TOTAL PROTEIN: 7.7 g/dL (ref 6.5–8.1)

## 2015-09-03 LAB — APTT: APTT: 32 s (ref 24–37)

## 2015-09-03 LAB — PROTIME-INR
INR: 1.05 (ref 0.00–1.49)
PROTHROMBIN TIME: 13.9 s (ref 11.6–15.2)

## 2015-09-03 LAB — ABO/RH: ABO/RH(D): O POS

## 2015-09-03 LAB — SURGICAL PCR SCREEN
MRSA, PCR: NEGATIVE
Staphylococcus aureus: NEGATIVE

## 2015-09-03 NOTE — Progress Notes (Signed)
Clearance- DR Trilby Drummer 07/19/15 on chart  EKG- 07/19/15 on chart  DR Ethelene Hal- 07/19/15- clearance on chart

## 2015-09-09 ENCOUNTER — Ambulatory Visit: Payer: Self-pay | Admitting: Orthopedic Surgery

## 2015-09-09 NOTE — H&P (Signed)
Seth [name] DOB: [birth year] Married / Language: English / Race: Black or African American Male Date of Admission:  09/10/2015 CC:  Right Knee Pain History of Present Illness The patient is a 75 year old male who comes in for a preoperative History and Physical. The patient is scheduled for a right total knee arthroplasty to be performed by Dr. Dione Plover. Aluisio, MD at [facility] on 09-10-15. The patient is a 75 year old male who presented for follow up of their knee. The patient is being followed for their right knee pain and osteoarthritis. They are several month(s) out from last cortisone injection . Symptoms reported include: pain, pain at night, swelling, locking, catching, popping, pain with weightbearing, difficulty ambulating and difficulty arising from chair. The patient feels that they are doing poorly and report their pain level to be moderate. The following medication has been used for pain control: antiinflammatory medication (aleve). The patient has not gotten any relief of their symptoms with Cortisone injections.  Unfortunately, his right knee is getting progressively worse. The cortisone injection barely helped him. He is at stage now where knee is limiting what he can and cannot do. He has pain at all times including at night. He has difficult time getting relief from any of this. He is now ready to proceed with surgery. They have been treated conservatively in the past for the above stated problem and despite conservative measures, they continue to have progressive pain and severe functional limitations and dysfunction. They have failed non-operative management including home exercise, medications, and injections. It is felt that they would benefit from undergoing total joint replacement. Risks and benefits of the procedure have been discussed with the patient and they elect to proceed with surgery. There are no active contraindications to surgery such as ongoing infection or  rapidly progressive neurological disease.   Problem List/Past Medical Arthralgia of right hip (M25.551)  Primary osteoarthritis of right knee (M17.11)  High blood pressure  Hypercholesterolemia  Prostate Cancer  Pneumonia  Recent Bout in November 2016 Kidney Stone  Measles  Mumps  Allergies No Known Drug Allergies   Social History Alcohol use  current drinker; drinks hard liquor; only occasionally per week Children  2 Current work status  working part time Drug/Alcohol Rehab (Currently)  no Drug/Alcohol Rehab (Previously)  no Exercise  Exercises weekly; does running / walking Living situation  live with spouse Marital status  married Number of flights of stairs before winded  2-3 Pain Contract  no Tobacco / smoke exposure  no Tobacco use  former smoker; smoke(d) less than 1/2 pack(s) per day [place] POA  Medication History  Omeprazole (40MG  Capsule DR, Oral) Active. Quinapril HCl (40MG  Tablet, Oral) Active. Atorvastatin Calcium (20MG  Tablet, Oral) Active. AmLODIPine Besylate (10MG  Tablet, Oral) Active. Aleve (220MG  Tablet, Oral as needed) Active.  Past Surgical History  Colon Polyp Removal - Colonoscopy  Prostatectomy; Abdominal  Date: 2007.   Review of Systems General Not Present- Chills, Fatigue, Fever, Memory Loss, Night Sweats, Weight Gain and Weight Loss. Skin Not Present- Eczema, Hives, Itching, Lesions and Rash. HEENT Not Present- Dentures, Double Vision, Headache, Hearing Loss, Tinnitus and Visual Loss. Respiratory Not Present- Allergies, Chronic Cough, Coughing up blood, Shortness of breath at rest and Shortness of breath with exertion. Cardiovascular Not Present- Chest Pain, Difficulty Breathing Lying Down, Murmur, Palpitations, Racing/skipping heartbeats and Swelling. Gastrointestinal Not Present- Abdominal Pain, Bloody Stool, Constipation, Diarrhea, Difficulty Swallowing,  Heartburn, Jaundice,  Loss of appetitie, Nausea and Vomiting. Male Genitourinary Not Present- Blood in Urine, Discharge, Flank Pain, Incontinence, Painful Urination, Urgency, Urinary frequency, Urinary Retention, Urinating at Night and Weak urinary stream. Musculoskeletal Present- Joint Pain and Joint Swelling. Not Present- Back Pain, Morning Stiffness, Muscle Pain, Muscle Weakness and Spasms. Neurological Not Present- Blackout spells, Difficulty with balance, Dizziness, Paralysis, Tremor and Weakness. Psychiatric Not Present- Insomnia.  Vitals Weight: 248 lb Height: 71in Weight was reported by patient. Height was reported by patient. Body Surface Area: 2.31 m Body Mass Index: 34.59 kg/m  BP: 118/82 (Sitting, Left Arm, Standard)  Physical Exam General Mental Status -Alert, cooperative and good historian. General Appearance-pleasant, Not in acute distress. Orientation-Oriented X3. Build & Nutrition-Well nourished and Well developed.  Head and Neck Head-normocephalic, atraumatic . Neck Global Assessment - supple, no bruit auscultated on the right, no bruit auscultated on the left.  Eye Vision-Wears corrective lenses(readers). Pupil - Bilateral-Regular and Round. Motion - Bilateral-EOMI.  ENMT Note: upper and lower denture plates   Chest and Lung Exam Auscultation Breath sounds - clear at anterior chest wall and clear at posterior chest wall. Adventitious sounds - No Adventitious sounds.  Cardiovascular Auscultation Rhythm - Regular rate and rhythm. Heart Sounds - S1 WNL and S2 WNL. Murmurs & Other Heart Sounds - Auscultation of the heart reveals - No Murmurs.  Abdomen Palpation/Percussion Tenderness - Abdomen is non-tender to palpation. Rigidity (guarding) - Abdomen is soft. Auscultation Auscultation of the abdomen reveals - Bowel sounds normal.  Male Genitourinary Note: Not done, not pertinent to present illness   Musculoskeletal Note:  On exam, he is alert and oriented, in no apparent distress. Evaluation of his right knee shows no effusion. He has got a varus deformity. There is marked crepitus on range of motion in the knee. Range about 5 to 125. He is tender medially with no lateral tenderness or instability noted.  RADIOGRAPHS We reviewed his previous radiographs and he has got bone on bone arthritis in the medial and patellofemoral compartments of the right knee.  Assessment & Plan Primary osteoarthritis of right knee (M17.11)  Note:Surgical Plans: Right Total Knee Replacement  Disposition: Home  PCP: Dr. Ethelene Hal - Patient has been seen preoperatively and felt to be stable for surgery. [place], [place], [place] [address]  Topical TXA - Prostate Cancer  Anesthesia Issues: None  Signed electronically by Joelene Millin, III PA-C

## 2015-09-10 ENCOUNTER — Inpatient Hospital Stay (HOSPITAL_COMMUNITY): Payer: Medicare Other | Admitting: Registered Nurse

## 2015-09-10 ENCOUNTER — Inpatient Hospital Stay (HOSPITAL_COMMUNITY)
Admission: RE | Admit: 2015-09-10 | Discharge: 2015-09-12 | DRG: 470 | Disposition: A | Discharge status: Home Health | Payer: Medicare Other | Source: Ambulatory Visit | Attending: Orthopedic Surgery | Admitting: Orthopedic Surgery

## 2015-09-10 ENCOUNTER — Encounter (HOSPITAL_COMMUNITY)
Admission: RE | Disposition: A | Discharge status: Home Health | Payer: Self-pay | Source: Ambulatory Visit | Attending: Orthopedic Surgery

## 2015-09-10 ENCOUNTER — Encounter (HOSPITAL_COMMUNITY): Payer: Self-pay | Admitting: *Deleted

## 2015-09-10 DIAGNOSIS — M179 Osteoarthritis of knee, unspecified: Secondary | ICD-10-CM | POA: Diagnosis present

## 2015-09-10 DIAGNOSIS — I10 Essential (primary) hypertension: Secondary | ICD-10-CM | POA: Diagnosis present

## 2015-09-10 DIAGNOSIS — M1711 Unilateral primary osteoarthritis, right knee: Principal | ICD-10-CM | POA: Diagnosis present

## 2015-09-10 DIAGNOSIS — Z87891 Personal history of nicotine dependence: Secondary | ICD-10-CM

## 2015-09-10 DIAGNOSIS — Z79899 Other long term (current) drug therapy: Secondary | ICD-10-CM

## 2015-09-10 DIAGNOSIS — M1710 Unilateral primary osteoarthritis, unspecified knee: Secondary | ICD-10-CM | POA: Diagnosis present

## 2015-09-10 DIAGNOSIS — Z8546 Personal history of malignant neoplasm of prostate: Secondary | ICD-10-CM | POA: Diagnosis not present

## 2015-09-10 DIAGNOSIS — M21161 Varus deformity, not elsewhere classified, right knee: Secondary | ICD-10-CM | POA: Diagnosis present

## 2015-09-10 DIAGNOSIS — M25561 Pain in right knee: Secondary | ICD-10-CM | POA: Diagnosis present

## 2015-09-10 HISTORY — PX: TOTAL KNEE ARTHROPLASTY: SHX125

## 2015-09-10 LAB — TYPE AND SCREEN
ABO/RH(D): O POS
ANTIBODY SCREEN: NEGATIVE

## 2015-09-10 SURGERY — ARTHROPLASTY, KNEE, TOTAL
Anesthesia: Spinal | Site: Knee | Laterality: Right

## 2015-09-10 MED ORDER — ONDANSETRON HCL 4 MG/2ML IJ SOLN
INTRAMUSCULAR | Status: DC | PRN
  Administered 2015-09-10: 4 mg via INTRAVENOUS

## 2015-09-10 MED ORDER — FENTANYL CITRATE (PF) 100 MCG/2ML IJ SOLN
INTRAMUSCULAR | Status: DC | PRN
Start: 2015-09-10 — End: 2015-09-10
  Administered 2015-09-10 (×4): 50 ug via INTRAVENOUS

## 2015-09-10 MED ORDER — FENTANYL CITRATE (PF) 100 MCG/2ML IJ SOLN
INTRAMUSCULAR | Status: AC
  Filled 2015-09-10: qty 2

## 2015-09-10 MED ORDER — RIVAROXABAN 10 MG PO TABS
10.0000 mg | ORAL_TABLET | Freq: Every day | ORAL | Status: DC
  Administered 2015-09-11 – 2015-09-12 (×2): 10 mg via ORAL
  Filled 2015-09-10 (×3): qty 1

## 2015-09-10 MED ORDER — MORPHINE SULFATE (PF) 2 MG/ML IV SOLN: 1.0000 mg | INTRAVENOUS | Status: DC | PRN

## 2015-09-10 MED ORDER — SODIUM CHLORIDE 0.9 % IR SOLN
Status: DC | PRN
  Administered 2015-09-10: 1000 mL

## 2015-09-10 MED ORDER — ACETAMINOPHEN 650 MG RE SUPP: 650.0000 mg | Freq: Four times a day (QID) | RECTAL | Status: DC | PRN

## 2015-09-10 MED ORDER — HYDROMORPHONE HCL 2 MG/ML IJ SOLN
INTRAMUSCULAR | Status: AC
  Filled 2015-09-10: qty 1

## 2015-09-10 MED ORDER — CEFAZOLIN SODIUM-DEXTROSE 2-3 GM-% IV SOLR
2.0000 g | INTRAVENOUS | Status: AC
  Administered 2015-09-10: 2 g via INTRAVENOUS

## 2015-09-10 MED ORDER — FENTANYL CITRATE (PF) 100 MCG/2ML IJ SOLN
INTRAMUSCULAR | Status: AC
Start: 2015-09-10 — End: 2015-09-10
  Filled 2015-09-10: qty 2

## 2015-09-10 MED ORDER — ACETAMINOPHEN 500 MG PO TABS
1000.0000 mg | ORAL_TABLET | Freq: Four times a day (QID) | ORAL | Status: AC
  Administered 2015-09-10 – 2015-09-11 (×4): 1000 mg via ORAL
  Filled 2015-09-10 (×4): qty 2

## 2015-09-10 MED ORDER — METOCLOPRAMIDE HCL 5 MG/ML IJ SOLN: 5.0000 mg | Freq: Three times a day (TID) | INTRAMUSCULAR | Status: DC | PRN

## 2015-09-10 MED ORDER — METHOCARBAMOL 500 MG PO TABS: 500.0000 mg | ORAL_TABLET | Freq: Four times a day (QID) | ORAL | Status: DC | PRN

## 2015-09-10 MED ORDER — MENTHOL 3 MG MT LOZG: 1.0000 | LOZENGE | OROMUCOSAL | Status: DC | PRN

## 2015-09-10 MED ORDER — BUPIVACAINE LIPOSOME 1.3 % IJ SUSP
INTRAMUSCULAR | Status: DC | PRN
  Administered 2015-09-10: 20 mL

## 2015-09-10 MED ORDER — ALBUTEROL SULFATE (2.5 MG/3ML) 0.083% IN NEBU: 3.0000 mL | INHALATION_SOLUTION | Freq: Four times a day (QID) | RESPIRATORY_TRACT | Status: DC | PRN

## 2015-09-10 MED ORDER — METOCLOPRAMIDE HCL 10 MG PO TABS: 5.0000 mg | ORAL_TABLET | Freq: Three times a day (TID) | ORAL | Status: DC | PRN

## 2015-09-10 MED ORDER — SODIUM CHLORIDE 0.9 % IJ SOLN
INTRAMUSCULAR | Status: AC
  Filled 2015-09-10: qty 50

## 2015-09-10 MED ORDER — MIDAZOLAM HCL 2 MG/2ML IJ SOLN
INTRAMUSCULAR | Status: AC
  Filled 2015-09-10: qty 2

## 2015-09-10 MED ORDER — FLEET ENEMA 7-19 GM/118ML RE ENEM: 1.0000 | ENEMA | Freq: Once | RECTAL | Status: DC | PRN

## 2015-09-10 MED ORDER — DEXAMETHASONE SODIUM PHOSPHATE 10 MG/ML IJ SOLN
INTRAMUSCULAR | Status: AC
  Filled 2015-09-10: qty 1

## 2015-09-10 MED ORDER — HYDROMORPHONE HCL 1 MG/ML IJ SOLN
0.2500 mg | INTRAMUSCULAR | Status: DC | PRN
  Administered 2015-09-10 (×2): 0.5 mg via INTRAVENOUS

## 2015-09-10 MED ORDER — ACETAMINOPHEN 10 MG/ML IV SOLN
INTRAVENOUS | Status: AC
  Filled 2015-09-10: qty 100

## 2015-09-10 MED ORDER — CEFAZOLIN SODIUM-DEXTROSE 2-3 GM-% IV SOLR
2.0000 g | Freq: Four times a day (QID) | INTRAVENOUS | Status: AC
  Administered 2015-09-10 – 2015-09-11 (×2): 2 g via INTRAVENOUS
  Filled 2015-09-10 (×2): qty 50

## 2015-09-10 MED ORDER — HYDROMORPHONE HCL 1 MG/ML IJ SOLN
INTRAMUSCULAR | Status: DC | PRN
  Administered 2015-09-10 (×4): 0.5 mg via INTRAVENOUS

## 2015-09-10 MED ORDER — PROPOFOL 500 MG/50ML IV EMUL
INTRAVENOUS | Status: DC | PRN
  Administered 2015-09-10: 25 ug/kg/min via INTRAVENOUS

## 2015-09-10 MED ORDER — LIDOCAINE HCL (CARDIAC) 20 MG/ML IV SOLN
INTRAVENOUS | Status: AC
  Filled 2015-09-10: qty 5

## 2015-09-10 MED ORDER — ONDANSETRON HCL 4 MG/2ML IJ SOLN
INTRAMUSCULAR | Status: AC
  Filled 2015-09-10: qty 2

## 2015-09-10 MED ORDER — CHLORHEXIDINE GLUCONATE 4 % EX LIQD: 60.0000 mL | Freq: Once | CUTANEOUS | Status: DC

## 2015-09-10 MED ORDER — HYDROMORPHONE HCL 1 MG/ML IJ SOLN
INTRAMUSCULAR | Status: AC
  Filled 2015-09-10: qty 1

## 2015-09-10 MED ORDER — ONDANSETRON HCL 4 MG/2ML IJ SOLN: 4.0000 mg | Freq: Four times a day (QID) | INTRAMUSCULAR | Status: DC | PRN

## 2015-09-10 MED ORDER — LABETALOL HCL 5 MG/ML IV SOLN
INTRAVENOUS | Status: AC
  Filled 2015-09-10: qty 4

## 2015-09-10 MED ORDER — BUPIVACAINE HCL (PF) 0.25 % IJ SOLN
INTRAMUSCULAR | Status: AC
  Filled 2015-09-10: qty 30

## 2015-09-10 MED ORDER — TRAMADOL HCL 50 MG PO TABS
50.0000 mg | ORAL_TABLET | Freq: Four times a day (QID) | ORAL | Status: DC | PRN
  Administered 2015-09-11 – 2015-09-12 (×3): 100 mg via ORAL
  Filled 2015-09-10 (×3): qty 2

## 2015-09-10 MED ORDER — SODIUM CHLORIDE 0.9 % IV SOLN: INTRAVENOUS | Status: DC

## 2015-09-10 MED ORDER — SODIUM CHLORIDE 0.9 % IJ SOLN
INTRAMUSCULAR | Status: DC | PRN
  Administered 2015-09-10: 30 mL

## 2015-09-10 MED ORDER — MIDAZOLAM HCL 5 MG/5ML IJ SOLN
INTRAMUSCULAR | Status: DC | PRN
  Administered 2015-09-10 (×2): 1 mg via INTRAVENOUS

## 2015-09-10 MED ORDER — FENTANYL CITRATE (PF) 100 MCG/2ML IJ SOLN
25.0000 ug | INTRAMUSCULAR | Status: DC | PRN
  Administered 2015-09-10 (×2): 50 ug via INTRAVENOUS

## 2015-09-10 MED ORDER — DOCUSATE SODIUM 100 MG PO CAPS
100.0000 mg | ORAL_CAPSULE | Freq: Two times a day (BID) | ORAL | Status: DC
  Administered 2015-09-10 – 2015-09-11 (×3): 100 mg via ORAL

## 2015-09-10 MED ORDER — PHENOL 1.4 % MT LIQD: 1.0000 | OROMUCOSAL | Status: DC | PRN

## 2015-09-10 MED ORDER — OXYCODONE HCL 5 MG/5ML PO SOLN
5.0000 mg | Freq: Once | ORAL | Status: DC | PRN
  Filled 2015-09-10: qty 5

## 2015-09-10 MED ORDER — SODIUM CHLORIDE 0.9 % IV SOLN
2000.0000 mg | INTRAVENOUS | Status: DC | PRN
  Administered 2015-09-10: 2000 mg via TOPICAL

## 2015-09-10 MED ORDER — DEXAMETHASONE SODIUM PHOSPHATE 10 MG/ML IJ SOLN
10.0000 mg | Freq: Once | INTRAMUSCULAR | Status: AC
  Administered 2015-09-10: 10 mg via INTRAVENOUS

## 2015-09-10 MED ORDER — ROPIVACAINE HCL 5 MG/ML IJ SOLN
INTRAMUSCULAR | Status: DC | PRN
  Administered 2015-09-10: 30 mL via PERINEURAL

## 2015-09-10 MED ORDER — ACETAMINOPHEN 325 MG PO TABS: 650.0000 mg | ORAL_TABLET | Freq: Four times a day (QID) | ORAL | Status: DC | PRN

## 2015-09-10 MED ORDER — LIDOCAINE HCL (CARDIAC) 20 MG/ML IV SOLN
INTRAVENOUS | Status: DC | PRN
  Administered 2015-09-10: 100 mg via INTRAVENOUS

## 2015-09-10 MED ORDER — ACETAMINOPHEN 10 MG/ML IV SOLN
1000.0000 mg | Freq: Once | INTRAVENOUS | Status: AC
  Administered 2015-09-10: 1000 mg via INTRAVENOUS

## 2015-09-10 MED ORDER — BUPIVACAINE HCL 0.25 % IJ SOLN
INTRAMUSCULAR | Status: DC | PRN
  Administered 2015-09-10: 20 mL

## 2015-09-10 MED ORDER — SODIUM CHLORIDE 0.9 % IV SOLN
2000.0000 mg | Freq: Once | INTRAVENOUS | Status: DC
  Filled 2015-09-10: qty 20

## 2015-09-10 MED ORDER — POLYETHYLENE GLYCOL 3350 17 G PO PACK: 17.0000 g | PACK | Freq: Every day | ORAL | Status: DC | PRN

## 2015-09-10 MED ORDER — ROPIVACAINE HCL 5 MG/ML IJ SOLN
INTRAMUSCULAR | Status: AC
  Filled 2015-09-10: qty 30

## 2015-09-10 MED ORDER — DIPHENHYDRAMINE HCL 12.5 MG/5ML PO ELIX: 12.5000 mg | ORAL_SOLUTION | ORAL | Status: DC | PRN

## 2015-09-10 MED ORDER — BISACODYL 10 MG RE SUPP: 10.0000 mg | Freq: Every day | RECTAL | Status: DC | PRN

## 2015-09-10 MED ORDER — LACTATED RINGERS IV SOLN
INTRAVENOUS | Status: DC
  Administered 2015-09-10: 1000 mL via INTRAVENOUS

## 2015-09-10 MED ORDER — CEFAZOLIN SODIUM-DEXTROSE 2-3 GM-% IV SOLR
INTRAVENOUS | Status: AC
  Filled 2015-09-10: qty 50

## 2015-09-10 MED ORDER — ATORVASTATIN CALCIUM 40 MG PO TABS
40.0000 mg | ORAL_TABLET | Freq: Every morning | ORAL | Status: DC
  Administered 2015-09-11 – 2015-09-12 (×2): 40 mg via ORAL
  Filled 2015-09-10 (×2): qty 1

## 2015-09-10 MED ORDER — AMLODIPINE BESYLATE 10 MG PO TABS
10.0000 mg | ORAL_TABLET | Freq: Every morning | ORAL | Status: DC
  Administered 2015-09-11: 10 mg via ORAL
  Filled 2015-09-10 (×2): qty 1

## 2015-09-10 MED ORDER — SODIUM CHLORIDE 0.9 % IV SOLN
INTRAVENOUS | Status: DC
  Administered 2015-09-10 – 2015-09-11 (×2): via INTRAVENOUS

## 2015-09-10 MED ORDER — PROPOFOL 10 MG/ML IV BOLUS
INTRAVENOUS | Status: DC | PRN
  Administered 2015-09-10: 250 mg via INTRAVENOUS

## 2015-09-10 MED ORDER — PROMETHAZINE HCL 25 MG/ML IJ SOLN
6.2500 mg | INTRAMUSCULAR | Status: DC | PRN
Start: 2015-09-10 — End: 2015-09-10

## 2015-09-10 MED ORDER — OXYCODONE HCL 5 MG PO TABS
5.0000 mg | ORAL_TABLET | ORAL | Status: DC | PRN
  Administered 2015-09-11 (×2): 5 mg via ORAL
  Filled 2015-09-10 (×2): qty 1

## 2015-09-10 MED ORDER — LABETALOL HCL 5 MG/ML IV SOLN
INTRAVENOUS | Status: DC | PRN
  Administered 2015-09-10: 2.5 mg via INTRAVENOUS

## 2015-09-10 MED ORDER — METHOCARBAMOL 1000 MG/10ML IJ SOLN
500.0000 mg | Freq: Four times a day (QID) | INTRAVENOUS | Status: DC | PRN
  Administered 2015-09-10: 500 mg via INTRAVENOUS
  Filled 2015-09-10 (×2): qty 5

## 2015-09-10 MED ORDER — DEXAMETHASONE SODIUM PHOSPHATE 10 MG/ML IJ SOLN
10.0000 mg | Freq: Once | INTRAMUSCULAR | Status: AC
  Administered 2015-09-11: 10 mg via INTRAVENOUS
  Filled 2015-09-10: qty 1

## 2015-09-10 MED ORDER — PROPOFOL 10 MG/ML IV BOLUS
INTRAVENOUS | Status: AC
  Filled 2015-09-10: qty 40

## 2015-09-10 MED ORDER — OXYCODONE HCL 5 MG PO TABS: 5.0000 mg | ORAL_TABLET | Freq: Once | ORAL | Status: DC | PRN

## 2015-09-10 MED ORDER — LATANOPROST 0.005 % OP SOLN
1.0000 [drp] | Freq: Every day | OPHTHALMIC | Status: DC
  Administered 2015-09-10 – 2015-09-11 (×2): 1 [drp] via OPHTHALMIC
  Filled 2015-09-10: qty 2.5

## 2015-09-10 MED ORDER — ONDANSETRON HCL 4 MG PO TABS: 4.0000 mg | ORAL_TABLET | Freq: Four times a day (QID) | ORAL | Status: DC | PRN

## 2015-09-10 MED ORDER — BUPIVACAINE LIPOSOME 1.3 % IJ SUSP
20.0000 mL | Freq: Once | INTRAMUSCULAR | Status: DC
  Filled 2015-09-10: qty 20

## 2015-09-10 SURGICAL SUPPLY — 53 items
BAG DECANTER FOR FLEXI CONT (MISCELLANEOUS) ×3 IMPLANT
BAG SPEC THK2 15X12 ZIP CLS (MISCELLANEOUS) ×1
BAG ZIPLOCK 12X15 (MISCELLANEOUS) ×3 IMPLANT
BANDAGE ACE 6X5 VEL STRL LF (GAUZE/BANDAGES/DRESSINGS) ×1 IMPLANT
BANDAGE ELASTIC 6 VELCRO ST LF (GAUZE/BANDAGES/DRESSINGS) ×2 IMPLANT
BLADE SAG 18X100X1.27 (BLADE) ×3 IMPLANT
BLADE SAW SGTL 11.0X1.19X90.0M (BLADE) ×3 IMPLANT
BOWL SMART MIX CTS (DISPOSABLE) ×3 IMPLANT
CAP KNEE TOTAL 3 SIGMA ×2 IMPLANT
CEMENT HV SMART SET (Cement) ×6 IMPLANT
CLOSURE WOUND 1/2 X4 (GAUZE/BANDAGES/DRESSINGS) ×1
CLOTH BEACON ORANGE TIMEOUT ST (SAFETY) ×3 IMPLANT
CUFF TOURN SGL QUICK 34 (TOURNIQUET CUFF) ×3
CUFF TRNQT CYL 34X4X40X1 (TOURNIQUET CUFF) ×1 IMPLANT
DECANTER SPIKE VIAL GLASS SM (MISCELLANEOUS) ×3 IMPLANT
DRAPE U-SHAPE 47X51 STRL (DRAPES) ×3 IMPLANT
DRSG ADAPTIC 3X8 NADH LF (GAUZE/BANDAGES/DRESSINGS) ×3 IMPLANT
DRSG PAD ABDOMINAL 8X10 ST (GAUZE/BANDAGES/DRESSINGS) ×1 IMPLANT
DURAPREP 26ML APPLICATOR (WOUND CARE) ×3 IMPLANT
ELECT REM PT RETURN 9FT ADLT (ELECTROSURGICAL) ×3
ELECTRODE REM PT RTRN 9FT ADLT (ELECTROSURGICAL) ×1 IMPLANT
EVACUATOR 1/8 PVC DRAIN (DRAIN) ×3 IMPLANT
GAUZE SPONGE 4X4 12PLY STRL (GAUZE/BANDAGES/DRESSINGS) ×3 IMPLANT
GLOVE BIO SURGEON STRL SZ7.5 (GLOVE) IMPLANT
GLOVE BIO SURGEON STRL SZ8 (GLOVE) ×3 IMPLANT
GLOVE BIOGEL PI IND STRL 6.5 (GLOVE) IMPLANT
GLOVE BIOGEL PI IND STRL 8 (GLOVE) ×1 IMPLANT
GLOVE BIOGEL PI INDICATOR 6.5 (GLOVE)
GLOVE BIOGEL PI INDICATOR 8 (GLOVE) ×2
GLOVE SURG SS PI 6.5 STRL IVOR (GLOVE) IMPLANT
GOWN STRL REUS W/TWL LRG LVL3 (GOWN DISPOSABLE) ×3 IMPLANT
GOWN STRL REUS W/TWL XL LVL3 (GOWN DISPOSABLE) IMPLANT
HANDPIECE INTERPULSE COAX TIP (DISPOSABLE) ×3
IMMOBILIZER KNEE 20 (SOFTGOODS) ×2 IMPLANT
IMMOBILIZER KNEE 20 THIGH 36 (SOFTGOODS) ×1 IMPLANT
MANIFOLD NEPTUNE II (INSTRUMENTS) ×3 IMPLANT
NS IRRIG 1000ML POUR BTL (IV SOLUTION) ×3 IMPLANT
PACK TOTAL KNEE CUSTOM (KITS) ×3 IMPLANT
PAD ABD 8X10 STRL (GAUZE/BANDAGES/DRESSINGS) ×2 IMPLANT
PADDING CAST COTTON 6X4 STRL (CAST SUPPLIES) ×5 IMPLANT
POSITIONER SURGICAL ARM (MISCELLANEOUS) ×3 IMPLANT
SET HNDPC FAN SPRY TIP SCT (DISPOSABLE) ×1 IMPLANT
STRIP CLOSURE SKIN 1/2X4 (GAUZE/BANDAGES/DRESSINGS) ×3 IMPLANT
SUT MNCRL AB 4-0 PS2 18 (SUTURE) ×3 IMPLANT
SUT VIC AB 2-0 CT1 27 (SUTURE) ×9
SUT VIC AB 2-0 CT1 TAPERPNT 27 (SUTURE) ×3 IMPLANT
SUT VLOC 180 0 24IN GS25 (SUTURE) ×3 IMPLANT
SYR 50ML LL SCALE MARK (SYRINGE) ×3 IMPLANT
TRAY FOLEY W/METER SILVER 14FR (SET/KITS/TRAYS/PACK) ×1 IMPLANT
TRAY FOLEY W/METER SILVER 16FR (SET/KITS/TRAYS/PACK) ×3 IMPLANT
WATER STERILE IRR 1500ML POUR (IV SOLUTION) ×3 IMPLANT
WRAP KNEE MAXI GEL POST OP (GAUZE/BANDAGES/DRESSINGS) ×3 IMPLANT
YANKAUER SUCT BULB TIP 10FT TU (MISCELLANEOUS) ×3 IMPLANT

## 2015-09-10 NOTE — Interval H&P Note (Signed)
History and Physical Interval Note:  09/10/2015 1:53 PM  Seth [name]  has presented today for surgery, with the diagnosis of right knee osteoarthritis  The various methods of treatment have been discussed with the patient and family. After consideration of risks, benefits and other options for treatment, the patient has consented to  Procedure(s): RIGHT TOTAL KNEE ARTHROPLASTY (Right) as a surgical intervention .  The patient's history has been reviewed, patient examined, no change in status, stable for surgery.  I have reviewed the patient's chart and labs.  Questions were answered to the patient's satisfaction.     Gearlean Alf

## 2015-09-10 NOTE — Transfer of Care (Signed)
Immediate Anesthesia Transfer of Care Note  Patient: Hadley Pen  Procedure(s) Performed: Procedure(s): RIGHT TOTAL KNEE ARTHROPLASTY (Right)  Patient Location: PACU  Anesthesia Type:General  Level of Consciousness: awake, alert , oriented and patient cooperative  Airway & Oxygen Therapy: Patient Spontanous Breathing and Patient connected to face mask oxygen  Post-op Assessment: Report given to RN, Post -op Vital signs reviewed and stable and Patient moving all extremities  Post vital signs: Reviewed and stable  Last Vitals:  Filed Vitals:   09/10/15 1141 09/10/15 1232  BP: 116/85   Pulse: 113 100  Temp: 36.5 C   Resp: 18     Complications: No apparent anesthesia complications

## 2015-09-10 NOTE — Anesthesia Procedure Notes (Addendum)
Spinal Patient location during procedure: OR Start time: 09/10/2015 2:32 PM Staffing Resident/CRNA: ARMISTEAD, LACEY A Performed by: resident/CRNA  Preanesthetic Checklist Completed: patient identified, site marked, surgical consent, pre-op evaluation, timeout performed, IV checked, risks and benefits discussed and monitors and equipment checked Spinal Block Patient position: sitting Prep: ChloraPrep Patient monitoring: heart rate, continuous pulse ox and blood pressure Location: L3-4 Injection technique: single-shot Needle Needle type: Spinocan  Needle gauge: 24 G Needle length: 9 cm Additional Notes Expiration date of kit checked and confirmed. Patient tolerated procedure well, without complications.    Procedure Name: LMA Insertion Date/Time: 09/10/2015 2:27 PM Performed by: Carleene Cooper A Pre-anesthesia Checklist: Patient identified, Timeout performed, Emergency Drugs available, Suction available and Patient being monitored Patient Re-evaluated:Patient Re-evaluated prior to inductionOxygen Delivery Method: Circle system utilized Preoxygenation: Pre-oxygenation with 100% oxygen Intubation Type: IV induction LMA: LMA with gastric port inserted LMA Size: 4.0 Tube type: Oral Number of attempts: 1 Placement Confirmation: positive ETCO2 and breath sounds checked- equal and bilateral Tube secured with: Tape Dental Injury: Teeth and Oropharynx as per pre-operative assessment     Anesthesia Regional Block:  Adductor canal block  Pre-Anesthetic Checklist: ,, timeout performed, Correct Patient, Correct Site, Correct Laterality, Correct Procedure, Correct Position, site marked, Risks and benefits discussed,  Surgical consent,  Pre-op evaluation,  At surgeon's request and post-op pain management  Laterality: Right  Prep: chloraprep       Needles:  Injection technique: Single-shot  Needle Type: Echogenic Stimulator Needle     Needle Length: 9cm 9 cm Needle Gauge: 21 and  21 G    Additional Needles:  Procedures: ultrasound guided (picture in chart) Adductor canal block Narrative:  Injection made incrementally with aspirations every 5 mL.  Performed by: Personally  Anesthesiologist: Jlynn Langille  Additional Notes: Risks, benefits and alternative to block explained extensively.  Patient tolerated procedure well, without complications.

## 2015-09-10 NOTE — Progress Notes (Signed)
Pt arouses confused, tries to pull at lines, tries to hit staff.  Attempts made to calm pt and orient him to pacu

## 2015-09-10 NOTE — Op Note (Signed)
Pre-operative diagnosis- Osteoarthritis  Right knee(s)  Post-operative diagnosis- Osteoarthritis Right knee(s)  Procedure-  Right  Total Knee Arthroplasty  Surgeon- Dione Plover. Wandra Babin, MD  Assistant- Arlee Muslim, PA-C   Anesthesia-  General  EBL-* No blood loss amount entered *   Drains Hemovac  Tourniquet time-  Total Tourniquet Time Documented: Thigh (Right) - 39 minutes Total: Thigh (Right) - 39 minutes     Complications- None  Condition-PACU - hemodynamically stable.   Brief Clinical Note  Seth [name] is a 75 y.o. year old male with end stage OA of his right knee with progressively worsening pain and dysfunction. He has constant pain, with activity and at rest and significant functional deficits with difficulties even with ADLs. He has had extensive non-op management including analgesics, injections of cortisone, and home exercise program, but remains in significant pain with significant dysfunction. He presents now for rightt Total Knee Arthroplasty.    Procedure in detail---   The patient is brought into the operating room and positioned supine on the operating table. After successful administration of  General,   a tourniquet is placed high on the  Right thigh(s) and the lower extremity is prepped and draped in the usual sterile fashion. Time out is performed by the operating team and then the  Right lower extremity is wrapped in Esmarch, knee flexed and the tourniquet inflated to 300 mmHg.       A midline incision is made with a ten blade through the subcutaneous tissue to the level of the extensor mechanism. A fresh blade is used to make a medial parapatellar arthrotomy. Soft tissue over the proximal medial tibia is subperiosteally elevated to the joint line with a knife and into the semimembranosus bursa with a Cobb elevator. Soft tissue over the proximal lateral tibia is elevated with attention being paid to avoiding the patellar tendon on the tibial tubercle. The patella  is everted, knee flexed 90 degrees and the ACL and PCL are removed. Findings are bone on bone medial and patellofemoral with large global osteophytes.        The drill is used to create a starting hole in the distal femur and the canal is thoroughly irrigated with sterile saline to remove the fatty contents. The 5 degree Right  valgus alignment guide is placed into the femoral canal and the distal femoral cutting block is pinned to remove 10 mm off the distal femur. Resection is made with an oscillating saw.      The tibia is subluxed forward and the menisci are removed. The extramedullary alignment guide is placed referencing proximally at the medial aspect of the tibial tubercle and distally along the second metatarsal axis and tibial crest. The block is pinned to remove 4mm off the more deficient medial  side. Resection is made with an oscillating saw. Size 5is the most appropriate size for the tibia and the proximal tibia is prepared with the modular drill and keel punch for that size.      The femoral sizing guide is placed and size 6 is most appropriate. Rotation is marked off the epicondylar axis and confirmed by creating a rectangular flexion gap at 90 degrees. The size 6 cutting block is pinned in this rotation and the anterior, posterior and chamfer cuts are made with the oscillating saw. The intercondylar block is then placed and that cut is made.      Trial size 5 tibial component, trial size 6 posterior stabilized femur and a 10  mm  posterior stabilized rotating platform insert trial is placed. Full extension is achieved with excellent varus/valgus and anterior/posterior balance throughout full range of motion. The patella is everted and thickness measured to be 27  mm. Free hand resection is taken to 15 mm, a 41 template is placed, lug holes are drilled, trial patella is placed, and it tracks normally. Osteophytes are removed off the posterior femur with the trial in place. All trials are removed  and the cut bone surfaces prepared with pulsatile lavage. Cement is mixed and once ready for implantation, the size 5 tibial implant, size  6 posterior stabilized femoral component, and the size 41 patella are cemented in place and the patella is held with the clamp. The trial insert is placed and the knee held in full extension. The Exparel (20 ml mixed with 30 ml saline) and .25% Bupivicaine, are injected into the extensor mechanism, posterior capsule, medial and lateral gutters and subcutaneous tissues.  All extruded cement is removed and once the cement is hard the permanent 10 mm posterior stabilized rotating platform insert is placed into the tibial tray.      The wound is copiously irrigated with saline solution and the extensor mechanism closed over a hemovac drain with #1 V-loc suture. The tourniquet is released for a total tourniquet time of 39  minutes. Flexion against gravity is 140 degrees and the patella tracks normally. Subcutaneous tissue is closed with 2.0 vicryl and subcuticular with running 4.0 Monocryl. The incision is cleaned and dried and steri-strips and a bulky sterile dressing are applied. The limb is placed into a knee immobilizer and the patient is awakened and transported to recovery in stable condition.      Please note that a surgical assistant was a medical necessity for this procedure in order to perform it in a safe and expeditious manner. Surgical assistant was necessary to retract the ligaments and vital neurovascular structures to prevent injury to them and also necessary for proper positioning of the limb to allow for anatomic placement of the prosthesis.   Dione Plover Azarya Oconnell, MD    09/10/2015, 3:53 PM

## 2015-09-10 NOTE — H&P (View-Only) (Signed)
Seth [name] DOB: April 25, 1941 Married / Language: English / Race: Black or African American Male Date of Admission:  09/10/2015 CC:  Right Knee Pain History of Present Illness The patient is a 75 year old male who comes in for a preoperative History and Physical. The patient is scheduled for a right total knee arthroplasty to be performed by Dr. Dione Plover. Aluisio, MD at [facility] on 09-10-15. The patient is a 75 year old male who presented for follow up of their knee. The patient is being followed for their right knee pain and osteoarthritis. They are several month(s) out from last cortisone injection . Symptoms reported include: pain, pain at night, swelling, locking, catching, popping, pain with weightbearing, difficulty ambulating and difficulty arising from chair. The patient feels that they are doing poorly and report their pain level to be moderate. The following medication has been used for pain control: antiinflammatory medication (aleve). The patient has not gotten any relief of their symptoms with Cortisone injections.  Unfortunately, his right knee is getting progressively worse. The cortisone injection barely helped him. He is at stage now where knee is limiting what he can and cannot do. He has pain at all times including at night. He has difficult time getting relief from any of this. He is now ready to proceed with surgery. They have been treated conservatively in the past for the above stated problem and despite conservative measures, they continue to have progressive pain and severe functional limitations and dysfunction. They have failed non-operative management including home exercise, medications, and injections. It is felt that they would benefit from undergoing total joint replacement. Risks and benefits of the procedure have been discussed with the patient and they elect to proceed with surgery. There are no active contraindications to surgery such as ongoing infection or  rapidly progressive neurological disease.   Problem List/Past Medical Arthralgia of right hip (M25.551)  Primary osteoarthritis of right knee (M17.11)  High blood pressure  Hypercholesterolemia  Prostate Cancer  Pneumonia  Recent Bout in November 2016 Kidney Stone  Measles  Mumps  Allergies No Known Drug Allergies   Social History Alcohol use  current drinker; drinks hard liquor; only occasionally per week Children  2 Current work status  working part time Drug/Alcohol Rehab (Currently)  no Drug/Alcohol Rehab (Previously)  no Exercise  Exercises weekly; does running / walking Living situation  live with spouse Marital status  married Number of flights of stairs before winded  2-3 Pain Contract  no Tobacco / smoke exposure  no Tobacco use  former smoker; smoke(d) less than 1/2 pack(s) per day [place] POA  Medication History  Omeprazole (40MG  Capsule DR, Oral) Active. Quinapril HCl (40MG  Tablet, Oral) Active. Atorvastatin Calcium (20MG  Tablet, Oral) Active. AmLODIPine Besylate (10MG  Tablet, Oral) Active. Aleve (220MG  Tablet, Oral as needed) Active.  Past Surgical History  Colon Polyp Removal - Colonoscopy  Prostatectomy; Abdominal  Date: 2007.   Review of Systems General Not Present- Chills, Fatigue, Fever, Memory Loss, Night Sweats, Weight Gain and Weight Loss. Skin Not Present- Eczema, Hives, Itching, Lesions and Rash. HEENT Not Present- Dentures, Double Vision, Headache, Hearing Loss, Tinnitus and Visual Loss. Respiratory Not Present- Allergies, Chronic Cough, Coughing up blood, Shortness of breath at rest and Shortness of breath with exertion. Cardiovascular Not Present- Chest Pain, Difficulty Breathing Lying Down, Murmur, Palpitations, Racing/skipping heartbeats and Swelling. Gastrointestinal Not Present- Abdominal Pain, Bloody Stool, Constipation, Diarrhea, Difficulty Swallowing,  Heartburn, Jaundice,  Loss of appetitie, Nausea and Vomiting. Male Genitourinary Not Present- Blood in Urine, Discharge, Flank Pain, Incontinence, Painful Urination, Urgency, Urinary frequency, Urinary Retention, Urinating at Night and Weak urinary stream. Musculoskeletal Present- Joint Pain and Joint Swelling. Not Present- Back Pain, Morning Stiffness, Muscle Pain, Muscle Weakness and Spasms. Neurological Not Present- Blackout spells, Difficulty with balance, Dizziness, Paralysis, Tremor and Weakness. Psychiatric Not Present- Insomnia.  Vitals Weight: 248 lb Height: 71in Weight was reported by patient. Height was reported by patient. Body Surface Area: 2.31 m Body Mass Index: 34.59 kg/m  BP: 118/82 (Sitting, Left Arm, Standard)  Physical Exam General Mental Status -Alert, cooperative and good historian. General Appearance-pleasant, Not in acute distress. Orientation-Oriented X3. Build & Nutrition-Well nourished and Well developed.  Head and Neck Head-normocephalic, atraumatic . Neck Global Assessment - supple, no bruit auscultated on the right, no bruit auscultated on the left.  Eye Vision-Wears corrective lenses(readers). Pupil - Bilateral-Regular and Round. Motion - Bilateral-EOMI.  ENMT Note: upper and lower denture plates   Chest and Lung Exam Auscultation Breath sounds - clear at anterior chest wall and clear at posterior chest wall. Adventitious sounds - No Adventitious sounds.  Cardiovascular Auscultation Rhythm - Regular rate and rhythm. Heart Sounds - S1 WNL and S2 WNL. Murmurs & Other Heart Sounds - Auscultation of the heart reveals - No Murmurs.  Abdomen Palpation/Percussion Tenderness - Abdomen is non-tender to palpation. Rigidity (guarding) - Abdomen is soft. Auscultation Auscultation of the abdomen reveals - Bowel sounds normal.  Male Genitourinary Note: Not done, not pertinent to present illness   Musculoskeletal Note:  On exam, he is alert and oriented, in no apparent distress. Evaluation of his right knee shows no effusion. He has got a varus deformity. There is marked crepitus on range of motion in the knee. Range about 5 to 125. He is tender medially with no lateral tenderness or instability noted.  RADIOGRAPHS We reviewed his previous radiographs and he has got bone on bone arthritis in the medial and patellofemoral compartments of the right knee.  Assessment & Plan Primary osteoarthritis of right knee (M17.11)  Note:Surgical Plans: Right Total Knee Replacement  Disposition: Home  PCP: Dr. Ethelene Hal - Patient has been seen preoperatively and felt to be stable for surgery. [address], [address] [place], [place] [address]  Topical TXA - Prostate Cancer  Anesthesia Issues: None  Signed electronically by Joelene Millin, III PA-C

## 2015-09-10 NOTE — Anesthesia Preprocedure Evaluation (Addendum)
Anesthesia Evaluation  Patient identified by MRN, date of birth, ID band Patient awake    Reviewed: Allergy & Precautions, H&P , NPO status , Patient's Chart, lab work & pertinent test results  History of Anesthesia Complications Negative for: history of anesthetic complications  Airway Mallampati: III  TM Distance: >3 FB Neck ROM: full    Dental  (+) Upper Dentures, Lower Dentures, Missing   Pulmonary pneumonia, resolved, former smoker,    Pulmonary exam normal breath sounds clear to auscultation       Cardiovascular hypertension, On Medications Normal cardiovascular exam Rhythm:regular Rate:Normal     Neuro/Psych negative neurological ROS     GI/Hepatic negative GI ROS, Neg liver ROS,   Endo/Other  negative endocrine ROS  Renal/GU negative Renal ROS     Musculoskeletal  (+) Arthritis ,   Abdominal   Peds  Hematology negative hematology ROS (+)   Anesthesia Other Findings Denies pulmonary or cardiac complaints  Reproductive/Obstetrics negative OB ROS                           Anesthesia Physical Anesthesia Plan  ASA: III  Anesthesia Plan: Spinal   Post-op Pain Management:    Induction: Intravenous  Airway Management Planned: Simple Face Mask  Additional Equipment:   Intra-op Plan:   Post-operative Plan:   Informed Consent: I have reviewed the patients History and Physical, chart, labs and discussed the procedure including the risks, benefits and alternatives for the proposed anesthesia with the patient or authorized representative who has indicated his/her understanding and acceptance.   Dental Advisory Given  Plan Discussed with: Anesthesiologist, CRNA and Surgeon  Anesthesia Plan Comments:         Anesthesia Quick Evaluation

## 2015-09-10 NOTE — Progress Notes (Signed)
Adductor block placed at bedside

## 2015-09-10 NOTE — Anesthesia Postprocedure Evaluation (Signed)
Anesthesia Post Note  Patient: Seth [name]  Procedure(s) Performed: Procedure(s) (LRB): RIGHT TOTAL KNEE ARTHROPLASTY (Right)  Patient location during evaluation: PACU Anesthesia Type: General and Regional Level of consciousness: awake and alert Pain management: pain level controlled Vital Signs Assessment: post-procedure vital signs reviewed and stable Respiratory status: spontaneous breathing, nonlabored ventilation, respiratory function stable and patient connected to nasal cannula oxygen Cardiovascular status: blood pressure returned to baseline and stable Postop Assessment: no signs of nausea or vomiting Anesthetic complications: no    Last Vitals:  Filed Vitals:   09/10/15 1700 09/10/15 1715  BP: 128/82 130/83  Pulse: 95 90  Temp:    Resp: 13 17    Last Pain: There were no vitals filed for this visit.               Zenaida Deed

## 2015-09-11 LAB — CBC
HCT: 39.1 % (ref 39.0–52.0)
Hemoglobin: 12.4 g/dL — ABNORMAL LOW (ref 13.0–17.0)
MCH: 27.7 pg (ref 26.0–34.0)
MCHC: 31.7 g/dL (ref 30.0–36.0)
MCV: 87.3 fL (ref 78.0–100.0)
PLATELETS: 239 10*3/uL (ref 150–400)
RBC: 4.48 MIL/uL (ref 4.22–5.81)
RDW: 15.9 % — AB (ref 11.5–15.5)
WBC: 9.3 10*3/uL (ref 4.0–10.5)

## 2015-09-11 LAB — BASIC METABOLIC PANEL
ANION GAP: 8 (ref 5–15)
BUN: 10 mg/dL (ref 6–20)
CALCIUM: 8.6 mg/dL — AB (ref 8.9–10.3)
CO2: 23 mmol/L (ref 22–32)
Chloride: 103 mmol/L (ref 101–111)
Creatinine, Ser: 1.08 mg/dL (ref 0.61–1.24)
Glucose, Bld: 176 mg/dL — ABNORMAL HIGH (ref 65–99)
Potassium: 4.5 mmol/L (ref 3.5–5.1)
SODIUM: 134 mmol/L — AB (ref 135–145)

## 2015-09-11 MED ORDER — RIVAROXABAN 10 MG PO TABS: 10.0000 mg | ORAL_TABLET | Freq: Every day | ORAL | Status: DC

## 2015-09-11 MED ORDER — HYDROMORPHONE HCL 2 MG PO TABS: 2.0000 mg | ORAL_TABLET | ORAL | Status: DC | PRN

## 2015-09-11 MED ORDER — HYDROMORPHONE HCL 2 MG PO TABS
2.0000 mg | ORAL_TABLET | ORAL | Status: DC | PRN
  Administered 2015-09-11 (×2): 4 mg via ORAL
  Filled 2015-09-11 (×2): qty 2

## 2015-09-11 MED ORDER — CYCLOBENZAPRINE HCL 10 MG PO TABS
10.0000 mg | ORAL_TABLET | Freq: Three times a day (TID) | ORAL | Status: DC | PRN
  Administered 2015-09-11: 10 mg via ORAL
  Filled 2015-09-11 (×2): qty 1

## 2015-09-11 MED ORDER — TRAMADOL HCL 50 MG PO TABS: 50.0000 mg | ORAL_TABLET | Freq: Four times a day (QID) | ORAL | Status: DC | PRN

## 2015-09-11 MED ORDER — CYCLOBENZAPRINE HCL 10 MG PO TABS: 10.0000 mg | ORAL_TABLET | Freq: Three times a day (TID) | ORAL | Status: DC | PRN

## 2015-09-11 NOTE — Evaluation (Signed)
Occupational Therapy Evaluation Patient Details Name: Seth [name] MRN: [id number] DOB: Oct 29, 1940 Today's Date: 09/11/2015    History of Present Illness Pt is a 75 year old male s/p R TKA   Clinical Impression   Pt was admitted for the above.  Will follow in acute for continued family education and toilet transfers.  Pt was independent prior to admission.  He needs up to mod A for LB adls at this time    Follow Up Recommendations  No OT follow up;Supervision/Assistance - 24 hour    Equipment Recommendations  None recommended by OT (pt has BSC)    Recommendations for Other Services       Precautions / Restrictions Precautions Precautions: Fall;Knee Required Braces or Orthoses: Knee Immobilizer - Right Knee Immobilizer - Right: Discontinue once straight leg raise with < 10 degree lag Restrictions Weight Bearing Restrictions: No Other Position/Activity Restrictions: WBAT      Mobility Bed Mobility Overal bed mobility: Needs Assistance Bed Mobility: Supine to Sit          General bed mobility comments: oob  Transfers Overall transfer level: Needs assistance Equipment used: Rolling walker (2 wheeled) Transfers: Sit to/from Stand Sit to Stand: Min assist         General transfer comment: vcs for UE/LE placement    Balance                                            ADL Overall ADL's : Needs assistance/impaired     Grooming: Set up;Sitting   Upper Body Bathing: Set up;Sitting   Lower Body Bathing: Minimal assistance;Sit to/from stand   Upper Body Dressing : Set up;Sitting   Lower Body Dressing: Moderate assistance;Sit to/from stand                 General ADL Comments: educated wife on [place].  Pt stood to use urinal--did not need to use restroom.  Pt will sponge bathe at home. Reviewed tub readiness.  He can reach to start pants without reacher, but he does have this     Vision     Perception     Praxis       Pertinent Vitals/Pain Pain Assessment: 0-10 Pain Score: 5  Pain Location: R knee Pain Descriptors / Indicators: Aching Pain Intervention(s): Limited activity within patient's tolerance;Repositioned;Premedicated before session;Monitored during session     Hand Dominance     Extremity/Trunk Assessment Upper Extremity Assessment Upper Extremity Assessment: Overall WFL for tasks assessed          Communication Communication Communication: No difficulties   Cognition Arousal/Alertness: Awake/alert Behavior During Therapy: WFL for tasks assessed/performed Overall Cognitive Status: Within Functional Limits for tasks assessed                     General Comments       Exercises       Shoulder Instructions      Home Living Family/patient expects to be discharged to:: Private residence Living Arrangements: Spouse/significant other   Type of Home: House Home Access: Stairs to enter [employer] of Steps: 2   Home Layout: One level     Bathroom Shower/Tub: Tub/shower unit Shower/tub characteristics: Curtain Biochemist, clinical: Standard     Home Equipment: Environmental consultant - 2 wheels          Prior Functioning/Environment Level of Independence: Independent  OT Diagnosis: Generalized weakness   OT Problem List: Decreased strength;Decreased activity tolerance;Pain   OT Treatment/Interventions: Self-care/ADL training;DME and/or AE instruction;Patient/family education    OT Goals(Current goals can be found in the care plan section) Acute Rehab OT Goals Patient Stated Goal: get back to being independent OT Goal Formulation: With patient Time For Goal Achievement: 09/18/15 Potential to Achieve Goals: Good ADL Goals Pt Will Transfer to Toilet: with min guard assist;ambulating;bedside commode Additional ADL Goal #1: wife will assist with adl, sit to stand, including donning KI and cueing pt for sit to stand   OT Frequency: Min 2X/week    Barriers to D/C:            Co-evaluation              End of Session    Activity Tolerance: Patient tolerated treatment well Patient left: in chair;with call bell/phone within reach   Time: 1152-1212 OT Time Calculation (min): 20 min Charges:  OT General Charges $OT Visit: 1 Procedure OT Evaluation $OT Eval Low Complexity: 1 Procedure G-Codes:    Aquarius Latouche September 15, 2015, 1:08 PM  Lesle Chris, OTR/L [phone] September 15, 2015

## 2015-09-11 NOTE — Discharge Summary (Signed)
Physician Discharge Summary   Patient ID: Seth [name] MRN: [id number] DOB/AGE: 1941/07/26 75 y.o.  Admit date: 09/10/2015 Discharge date: 09-12-2015  Primary Diagnosis:  Osteoarthritis Right knee(s)  Admission Diagnoses:  Past Medical History  Diagnosis Date  . Hypertension   . Pneumonia     06/2015   . Arthritis   . Cancer ([place])     hx of prostate cancer    Discharge Diagnoses:   Principal Problem:   OA (osteoarthritis) of knee  Estimated body mass index is 35.62 kg/(m^2) as calculated from the following:   Height as of this encounter: 5' 11.5" (1.816 m).   Weight as of this encounter: 117.482 kg (259 lb).  Procedure:  Procedure(s) (LRB): RIGHT TOTAL KNEE ARTHROPLASTY (Right)   Consults: None  HPI: Seth [name] is a 75 y.o. year old male with end stage OA of his right knee with progressively worsening pain and dysfunction. He has constant pain, with activity and at rest and significant functional deficits with difficulties even with ADLs. He has had extensive non-op management including analgesics, injections of cortisone, and home exercise program, but remains in significant pain with significant dysfunction. He presents now for rightt Total Knee Arthroplasty.   Laboratory Data: Admission on 09/10/2015  Component Date Value Ref Range Status  . WBC 09/11/2015 9.3  4.0 - 10.5 K/uL Final  . RBC 09/11/2015 4.48  4.22 - 5.81 MIL/uL Final  . Hemoglobin 09/11/2015 12.4* 13.0 - 17.0 g/dL Final  . HCT 09/11/2015 39.1  39.0 - 52.0 % Final  . MCV 09/11/2015 87.3  78.0 - 100.0 fL Final  . MCH 09/11/2015 27.7  26.0 - 34.0 pg Final  . MCHC 09/11/2015 31.7  30.0 - 36.0 g/dL Final  . RDW 09/11/2015 15.9* 11.5 - 15.5 % Final  . Platelets 09/11/2015 239  150 - 400 K/uL Final  . Sodium 09/11/2015 134* 135 - 145 mmol/L Final  . Potassium 09/11/2015 4.5  3.5 - 5.1 mmol/L Final  . Chloride 09/11/2015 103  101 - 111 mmol/L Final  . CO2 09/11/2015 23  22 - 32 mmol/L Final  .  Glucose, Bld 09/11/2015 176* 65 - 99 mg/dL Final  . BUN 09/11/2015 10  6 - 20 mg/dL Final  . Creatinine, Ser 09/11/2015 1.08  0.61 - 1.24 mg/dL Final  . Calcium 09/11/2015 8.6* 8.9 - 10.3 mg/dL Final  . GFR calc non Af Amer 09/11/2015 >60  >60 mL/min Final  . GFR calc Af Amer 09/11/2015 >60  >60 mL/min Final   Comment: (NOTE) The eGFR has been calculated using the CKD EPI equation. This calculation has not been validated in all clinical situations. eGFR's persistently <60 mL/min signify possible Chronic Kidney Disease.   Georgiann Hahn gap 09/11/2015 8  5 - 15 Final  Hospital Outpatient Visit on 09/03/2015  Component Date Value Ref Range Status  . aPTT 09/03/2015 32  24 - 37 seconds Final  . WBC 09/03/2015 5.6  4.0 - 10.5 K/uL Final  . RBC 09/03/2015 4.91  4.22 - 5.81 MIL/uL Final  . Hemoglobin 09/03/2015 13.5  13.0 - 17.0 g/dL Final  . HCT 09/03/2015 42.3  39.0 - 52.0 % Final  . MCV 09/03/2015 86.2  78.0 - 100.0 fL Final  . MCH 09/03/2015 27.5  26.0 - 34.0 pg Final  . MCHC 09/03/2015 31.9  30.0 - 36.0 g/dL Final  . RDW 09/03/2015 16.2* 11.5 - 15.5 % Final  . Platelets 09/03/2015 244  150 - 400 K/uL Final  .  Sodium 09/03/2015 142  135 - 145 mmol/L Final  . Potassium 09/03/2015 4.5  3.5 - 5.1 mmol/L Final  . Chloride 09/03/2015 107  101 - 111 mmol/L Final  . CO2 09/03/2015 25  22 - 32 mmol/L Final  . Glucose, Bld 09/03/2015 124* 65 - 99 mg/dL Final  . BUN 09/03/2015 9  6 - 20 mg/dL Final  . Creatinine, Ser 09/03/2015 1.18  0.61 - 1.24 mg/dL Final  . Calcium 09/03/2015 9.5  8.9 - 10.3 mg/dL Final  . Total Protein 09/03/2015 7.7  6.5 - 8.1 g/dL Final  . Albumin 09/03/2015 4.1  3.5 - 5.0 g/dL Final  . AST 09/03/2015 26  15 - 41 U/L Final  . ALT 09/03/2015 20  17 - 63 U/L Final  . Alkaline Phosphatase 09/03/2015 76  38 - 126 U/L Final  . Total Bilirubin 09/03/2015 0.4  0.3 - 1.2 mg/dL Final  . GFR calc non Af Amer 09/03/2015 59* >60 mL/min Final  . GFR calc Af Amer 09/03/2015 >60  >60  mL/min Final   Comment: (NOTE) The eGFR has been calculated using the CKD EPI equation. This calculation has not been validated in all clinical situations. eGFR's persistently <60 mL/min signify possible Chronic Kidney Disease.   . Anion gap 09/03/2015 10  5 - 15 Final  . Prothrombin Time 09/03/2015 13.9  11.6 - 15.2 seconds Final  . INR 09/03/2015 1.05  0.00 - 1.49 Final  . ABO/RH(D) 09/03/2015 O POS   Final  . Antibody Screen 09/03/2015 NEG   Final  . Sample Expiration 09/03/2015 09/13/2015   Final  . Extend sample reason 09/03/2015 NO TRANSFUSIONS OR PREGNANCY IN THE PAST 3 MONTHS   Final  . Color, Urine 09/03/2015 YELLOW  YELLOW Final  . APPearance 09/03/2015 CLEAR  CLEAR Final  . Specific Gravity, Urine 09/03/2015 1.017  1.005 - 1.030 Final  . pH 09/03/2015 5.0  5.0 - 8.0 Final  . Glucose, UA 09/03/2015 NEGATIVE  NEGATIVE mg/dL Final  . Hgb urine dipstick 09/03/2015 NEGATIVE  NEGATIVE Final  . Bilirubin Urine 09/03/2015 NEGATIVE  NEGATIVE Final  . Ketones, ur 09/03/2015 NEGATIVE  NEGATIVE mg/dL Final  . Protein, ur 09/03/2015 NEGATIVE  NEGATIVE mg/dL Final  . Nitrite 09/03/2015 NEGATIVE  NEGATIVE Final  . Leukocytes, UA 09/03/2015 NEGATIVE  NEGATIVE Final   MICROSCOPIC NOT DONE ON URINES WITH NEGATIVE PROTEIN, BLOOD, LEUKOCYTES, NITRITE, OR GLUCOSE <1000 mg/dL.  Marland Kitchen MRSA, PCR 09/03/2015 NEGATIVE  NEGATIVE Final  . Staphylococcus aureus 09/03/2015 NEGATIVE  NEGATIVE Final   Comment:        The Xpert SA Assay (FDA approved for NASAL specimens in patients over 12 years of age), is one component of a comprehensive surveillance program.  Test performance has been validated by [facility] for patients greater than or equal to 61 year old. It is not intended to diagnose infection nor to guide or monitor treatment.   . ABO/RH(D) 09/03/2015 O POS   Final     X-Rays:Dg Chest 2 View  09/03/2015  CLINICAL DATA:  Preop knee replacement. History of hypertension. History of prostate  cancer. Former smoker EXAM: CHEST  2 VIEW COMPARISON:  07/05/2015 FINDINGS: Slight elevation of the left hemidiaphragm. Heart and mediastinal contours are within normal limits. No focal opacities or effusions. No acute bony abnormality. IMPRESSION: No active cardiopulmonary disease. Electronically Signed   By: Rolm Baptise M.D.   On: 09/03/2015 12:40    EKG: Orders placed or performed during the hospital encounter of  09/03/15  . EKG 12-Lead  . EKG 12-Lead     Hospital Course: Seth [name] is a 75 y.o. who was admitted to [facility]. They were brought to the operating room on 09/10/2015 and underwent Procedure(s): RIGHT TOTAL KNEE ARTHROPLASTY.  Patient tolerated the procedure well and was later transferred to the recovery room and then to the orthopaedic floor for postoperative care.  They were given PO and IV analgesics for pain control following their surgery.  They were given 24 hours of postoperative antibiotics of  Anti-infectives    Start     Dose/Rate Route Frequency Ordered Stop   09/11/15 0600  ceFAZolin (ANCEF) IVPB 2 g/50 mL premix     2 g 100 mL/hr over 30 Minutes Intravenous On call to O.R. 09/10/15 1208 09/10/15 1449   09/10/15 2100  ceFAZolin (ANCEF) IVPB 2 g/50 mL premix     2 g 100 mL/hr over 30 Minutes Intravenous Every 6 hours 09/10/15 1737 09/11/15 0246     and started on DVT prophylaxis in the form of Xarelto.   PT and OT were ordered for total joint protocol.  Discharge planning consulted to help with postop disposition and equipment needs.  Patient had a decent night on the evening of surgery.  They started to get up OOB with therapy on day one. Hemovac drain was pulled without difficulty.  Continued to work with therapy into day two.  Dressing was changed on day two and the incision was healing well. Patient was seen in rounds on POD 2 and was ready to go home.  Discharge home with home health Diet - Cardiac diet Follow up - in 2 weeks Activity -  WBAT Disposition - Home Condition Upon Discharge - Stable D/C Meds - See DC Summary DVT Prophylaxis - Xarelto  Discharge Instructions    Call MD / Call 911    Complete by:  As directed   If you experience chest pain or shortness of breath, CALL 911 and be transported to the hospital emergency room.  If you develope a fever above 101 F, pus (white drainage) or increased drainage or redness at the wound, or calf pain, call your surgeon's office.     Change dressing    Complete by:  As directed   Change dressing daily with sterile 4 x 4 inch gauze dressing and apply TED hose. Do not submerge the incision under water.     Constipation Prevention    Complete by:  As directed   Drink plenty of fluids.  Prune juice may be helpful.  You may use a stool softener, such as Colace (over the counter) 100 mg twice a day.  Use MiraLax (over the counter) for constipation as needed.     Diet - low sodium heart healthy    Complete by:  As directed      Discharge instructions    Complete by:  As directed   Pick up stool softner and laxative for home use following surgery while on pain medications. Do not submerge incision under water. Please use good hand washing techniques while changing dressing each day. May shower starting three days after surgery. Please use a clean towel to pat the incision dry following showers. Continue to use ice for pain and swelling after surgery. Do not use any lotions or creams on the incision until instructed by your surgeon.  Take Xarelto for two and a half more weeks, then discontinue Xarelto. Once the patient has completed the blood  thinner regimen, then take a Baby 81 mg Aspirin daily for three more weeks.  Postoperative Constipation Protocol  Constipation - defined medically as fewer than three stools per week and severe constipation as less than one stool per week.  One of the most common issues patients have following surgery is constipation.  Even if you have a  regular bowel pattern at home, your normal regimen is likely to be disrupted due to multiple reasons following surgery.  Combination of anesthesia, postoperative narcotics, change in appetite and fluid intake all can affect your bowels.  In order to avoid complications following surgery, here are some recommendations in order to help you during your recovery period.  Colace (docusate) - Pick up an over-the-counter form of Colace or another stool softener and take twice a day as long as you are requiring postoperative pain medications.  Take with a full glass of water daily.  If you experience loose stools or diarrhea, hold the colace until you stool forms back up.  If your symptoms do not get better within 1 week or if they get worse, check with your doctor.  Dulcolax (bisacodyl) - Pick up over-the-counter and take as directed by the product packaging as needed to assist with the movement of your bowels.  Take with a full glass of water.  Use this product as needed if not relieved by Colace only.   MiraLax (polyethylene glycol) - Pick up over-the-counter to have on hand.  MiraLax is a solution that will increase the amount of water in your bowels to assist with bowel movements.  Take as directed and can mix with a glass of water, juice, soda, coffee, or tea.  Take if you go more than two days without a movement. Do not use MiraLax more than once per day. Call your doctor if you are still constipated or irregular after using this medication for 7 days in a row.  If you continue to have problems with postoperative constipation, please contact the office for further assistance and recommendations.  If you experience "the worst abdominal pain ever" or develop nausea or vomiting, please contact the office immediatly for further recommendations for treatment.     Do not put a pillow under the knee. Place it under the heel.    Complete by:  As directed      Do not sit on low chairs, stoools or toilet seats, as  it may be difficult to get up from low surfaces    Complete by:  As directed      Driving restrictions    Complete by:  As directed   No driving until released by the physician.     Increase activity slowly as tolerated    Complete by:  As directed      Lifting restrictions    Complete by:  As directed   No lifting until released by the physician.     Patient may shower    Complete by:  As directed   You may shower without a dressing once there is no drainage.  Do not wash over the wound.  If drainage remains, do not shower until drainage stops.     TED hose    Complete by:  As directed   Use stockings (TED hose) for 3 weeks on both leg(s).  You may remove them at night for sleeping.     Weight bearing as tolerated    Complete by:  As directed   Laterality:  right  Extremity:  Lower            Medication List    STOP taking these medications        naproxen sodium 220 MG tablet  Commonly known as:  ANAPROX      TAKE these medications        albuterol 108 (90 Base) MCG/ACT inhaler  Commonly known as:  PROVENTIL HFA;VENTOLIN HFA  Inhale 1-2 puffs into the lungs every 6 (six) hours as needed. Shortness of breathe.     amLODipine 10 MG tablet  Commonly known as:  NORVASC  Take 10 mg by mouth every morning.     atorvastatin 40 MG tablet  Commonly known as:  LIPITOR  Take 40 mg by mouth every morning.     cyclobenzaprine 10 MG tablet  Commonly known as:  FLEXERIL  Take 1 tablet (10 mg total) by mouth 3 (three) times daily as needed for muscle spasms.     HYDROmorphone 2 MG tablet  Commonly known as:  DILAUDID  Take 1-2 tablets (2-4 mg total) by mouth every 3 (three) hours as needed for moderate pain or severe pain.     quinapril 40 MG tablet  Commonly known as:  ACCUPRIL  Take 40 mg by mouth every morning.     rivaroxaban 10 MG Tabs tablet  Commonly known as:  XARELTO  Take 1 tablet (10 mg total) by mouth daily with breakfast. Take Xarelto for two and a half more  weeks, then discontinue Xarelto. Once the patient has completed the blood thinner regimen, then take a Baby 81 mg Aspirin daily for three more weeks.     traMADol 50 MG tablet  Commonly known as:  ULTRAM  Take 1-2 tablets (50-100 mg total) by mouth every 6 (six) hours as needed (mild pain).     TRAVATAN Z 0.004 % Soln ophthalmic solution  Generic drug:  Travoprost (BAK Free)  Place 1 drop into both eyes at bedtime.           Follow-up Information    Follow up with [facility].   Why:  home health physical therapy   Contact information:   [place] [address] [phone]       Follow up with Gearlean Alf, MD. Schedule an appointment as soon as possible for a visit on 09/25/2015.   Specialty:  Orthopedic Surgery   Why:  Call office at [phone] to setup appointment on Tuesday 09/25/2015 with Dr. Wynelle Link.   Contact information:   [address] [place] [address] [phone]       Signed: Arlee Muslim, PA-C Orthopaedic Surgery 09/11/2015, 8:31 PM

## 2015-09-11 NOTE — Progress Notes (Signed)
Physical Therapy Treatment Note    09/11/15 1400  PT Visit Information  Last PT Received On 09/11/15  Assistance Needed +1  History of Present Illness Pt is a 75 year old male s/p R TKA  PT Time Calculation  PT Start Time (ACUTE ONLY) 1344  PT Stop Time (ACUTE ONLY) 1355  PT Time Calculation (min) (ACUTE ONLY) 11 min  Subjective Data  Subjective Pt ambulated in hallway again and then assisted back to bed. Ortho tech into room to place CPM.  Precautions  Precautions Fall;Knee  Knee Immobilizer - Right Discontinue once straight leg raise with < 10 degree lag  Restrictions  Other Position/Activity Restrictions WBAT  Pain Assessment  Pain Assessment 0-10  Pain Score 6  Pain Location R knee  Pain Descriptors / Indicators Aching;Sore  Pain Intervention(s) Limited activity within patient's tolerance;Monitored during session;Repositioned  Cognition  Arousal/Alertness Awake/alert  Behavior During Therapy WFL for tasks assessed/performed  Overall Cognitive Status Within Functional Limits for tasks assessed  Bed Mobility  Overal bed mobility Needs Assistance  Bed Mobility Sit to Supine  Sit to supine Min assist  General bed mobility comments assist for R LE  Transfers  Overall transfer level Needs assistance  Equipment used Rolling walker (2 wheeled)  Transfers Sit to/from Stand  Sit to Stand Min guard  General transfer comment verbal cues for safety  Ambulation/Gait  Ambulation/Gait assistance Min guard  Ambulation Distance (Feet) 140 Feet  Assistive device Rolling walker (2 wheeled)  Gait Pattern/deviations Step-through pattern;Antalgic;Decreased stance time - right;Decreased step length - left;Trunk flexed  General Gait Details verbal cues for step length, RW positioning and posture, tends to keep RW too far away  PT - End of Session  Equipment Utilized During Treatment Gait belt  Activity Tolerance Patient tolerated treatment well  Patient left in bed;with call bell/phone  within reach;with family/visitor present  Nurse Communication Mobility status  PT - Assessment/Plan  PT Plan Current plan remains appropriate  PT Frequency (ACUTE ONLY) 7X/week  Follow Up Recommendations Home health PT;Supervision for mobility/OOB  PT equipment None recommended by PT  PT Goal Progression  Progress towards PT goals Progressing toward goals  PT General Charges  $$ ACUTE PT VISIT 1 Procedure  PT Treatments  $Gait Training 8-22 mins   Carmelia Bake, PT, DPT 09/11/2015 Pager: [phone]

## 2015-09-11 NOTE — Care Management Note (Signed)
Case Management Note  Patient Details  Name: Seth [name] MRN: [id number] Date of Birth: August 02, 1941  Subjective/Objective:                  RIGHT TOTAL KNEE ARTHROPLASTY (Right) Action/Plan: Discharge planning Expected Discharge Date:  09/12/15               Expected Discharge Plan:  [place]  In-House Referral:     Discharge planning Services  CM Consult  Post Acute Care Choice:  Home Health Choice offered to:  Patient  DME Arranged:  N/A DME Agency:  NA  HH Arranged:  PT HH Agency:  [facility]  Status of Service:  Completed, signed off  Medicare Important Message Given:    Date Medicare IM Given:    Medicare IM give by:    Date Additional Medicare IM Given:    Additional Medicare Important Message give by:     If discussed at [place] of Stay Meetings, dates discussed:    Additional Comments: CM met with pt to offer choice of home health agency.  Pt chooses [facility] to render HHPT.  Referral given to [facility] rep, Tim (on unit).  Pt has both rolling walker and 3n1 at home.  No other CM needs were communicated. Dellie Catholic, RN 09/11/2015, 3:36 PM

## 2015-09-11 NOTE — Discharge Instructions (Signed)
° °Dr. Frank Aluisio °Total Joint Specialist °[facility] °[address] °[place], [place] [address] °[phone] ° °TOTAL KNEE REPLACEMENT POSTOPERATIVE DIRECTIONS ° °Knee Rehabilitation, Guidelines Following Surgery  °Results after knee surgery are often greatly improved when you follow the exercise, range of motion and muscle strengthening exercises prescribed by your doctor. Safety measures are also important to protect the knee from further injury. Any time any of these exercises cause you to have increased pain or swelling in your knee joint, decrease the amount until you are comfortable again and slowly increase them. If you have problems or questions, call your caregiver or physical therapist for advice.  ° °HOME CARE INSTRUCTIONS  °Remove items at home which could result in a fall. This includes throw rugs or furniture in walking pathways.  °· ICE to the affected knee every three hours for 30 minutes at a time and then as needed for pain and swelling.  Continue to use ice on the knee for pain and swelling from surgery. You may notice swelling that will progress down to the foot and ankle.  This is normal after surgery.  Elevate the leg when you are not up walking on it.   °· Continue to use the breathing machine which will help keep your temperature down.  It is common for your temperature to cycle up and down following surgery, especially at night when you are not up moving around and exerting yourself.  The breathing machine keeps your lungs expanded and your temperature down. °· Do not place pillow under knee, focus on keeping the knee straight while resting ° °DIET °You may resume your previous home diet once your are discharged from the hospital. ° °DRESSING / WOUND CARE / SHOWERING °You may shower 3 days after surgery, but keep the wounds dry during showering.  You may use an occlusive plastic wrap (Press'n Seal for example), NO SOAKING/SUBMERGING IN THE BATHTUB.  If the  bandage gets wet, change with a clean dry gauze.  If the incision gets wet, pat the wound dry with a clean towel. °You may start showering once you are discharged home but do not submerge the incision under water. Just pat the incision dry and apply a dry gauze dressing on daily. °Change the surgical dressing daily and reapply a dry dressing each time. ° °ACTIVITY °Walk with your walker as instructed. °Use walker as long as suggested by your caregivers. °Avoid periods of inactivity such as sitting longer than an hour when not asleep. This helps prevent blood clots.  °You may resume a sexual relationship in one month or when given the OK by your doctor.  °You may return to work once you are cleared by your doctor.  °Do not drive a car for 6 weeks or until released by you surgeon.  °Do not drive while taking narcotics. ° °WEIGHT BEARING °Weight bearing as tolerated with assist device (walker, cane, etc) as directed, use it as long as suggested by your surgeon or therapist, typically at least 4-6 weeks. ° °POSTOPERATIVE CONSTIPATION PROTOCOL °Constipation - defined medically as fewer than three stools per week and severe constipation as less than one stool per week. ° °One of the most common issues patients have following surgery is constipation.  Even if you have a regular bowel pattern at home, your normal regimen is likely to be disrupted due to multiple reasons following surgery.  Combination of anesthesia, postoperative narcotics, change in appetite and fluid intake all can affect your bowels.    In order to avoid complications following surgery, here are some recommendations in order to help you during your recovery period. ° °Colace (docusate) - Pick up an over-the-counter form of Colace or another stool softener and take twice a day as long as you are requiring postoperative pain medications.  Take with a full glass of water daily.  If you experience loose stools or diarrhea, hold the colace until you stool forms  back up.  If your symptoms do not get better within 1 week or if they get worse, check with your doctor. ° °Dulcolax (bisacodyl) - Pick up over-the-counter and take as directed by the product packaging as needed to assist with the movement of your bowels.  Take with a full glass of water.  Use this product as needed if not relieved by Colace only.  ° °MiraLax (polyethylene glycol) - Pick up over-the-counter to have on hand.  MiraLax is a solution that will increase the amount of water in your bowels to assist with bowel movements.  Take as directed and can mix with a glass of water, juice, soda, coffee, or tea.  Take if you go more than two days without a movement. °Do not use MiraLax more than once per day. Call your doctor if you are still constipated or irregular after using this medication for 7 days in a row. ° °If you continue to have problems with postoperative constipation, please contact the office for further assistance and recommendations.  If you experience "the worst abdominal pain ever" or develop nausea or vomiting, please contact the office immediatly for further recommendations for treatment. ° °ITCHING ° If you experience itching with your medications, try taking only a single pain pill, or even half a pain pill at a time.  You can also use Benadryl over the counter for itching or also to help with sleep.  ° °TED HOSE STOCKINGS °Wear the elastic stockings on both legs for three weeks following surgery during the day but you may remove then at night for sleeping. ° °MEDICATIONS °See your medication summary on the “After Visit Summary” that the nursing staff will review with you prior to discharge.  You may have some home medications which will be placed on hold until you complete the course of blood thinner medication.  It is important for you to complete the blood thinner medication as prescribed by your surgeon.  Continue your approved medications as instructed at time of  discharge. ° °PRECAUTIONS °If you experience chest pain or shortness of breath - call 911 immediately for transfer to the hospital emergency department.  °If you develop a fever greater that 101 F, purulent drainage from wound, increased redness or drainage from wound, foul odor from the wound/dressing, or calf pain - CONTACT YOUR SURGEON.   °                                                °FOLLOW-UP APPOINTMENTS °Make sure you keep all of your appointments after your operation with your surgeon and caregivers. You should call the office at the above phone number and make an appointment for approximately two weeks after the date of your surgery or on the date instructed by your surgeon outlined in the "After Visit Summary". ° ° °RANGE OF MOTION AND STRENGTHENING EXERCISES  °Rehabilitation of the knee is important following a knee injury or   an operation. After just a few days of immobilization, the muscles of the thigh which control the knee become weakened and shrink (atrophy). Knee exercises are designed to build up the tone and strength of the thigh muscles and to improve knee motion. Often times heat used for twenty to thirty minutes before working out will loosen up your tissues and help with improving the range of motion but do not use heat for the first two weeks following surgery. These exercises can be done on a training (exercise) mat, on the floor, on a table or on a bed. Use what ever works the best and is most comfortable for you Knee exercises include:  °Leg Lifts - While your knee is still immobilized in a splint or cast, you can do straight leg raises. Lift the leg to 60 degrees, hold for 3 sec, and slowly lower the leg. Repeat 10-20 times 2-3 times daily. Perform this exercise against resistance later as your knee gets better.  °Quad and Hamstring Sets - Tighten up the muscle on the front of the thigh (Quad) and hold for 5-10 sec. Repeat this 10-20 times hourly. Hamstring sets are done by pushing the  foot backward against an object and holding for 5-10 sec. Repeat as with quad sets.  °· Leg Slides: Lying on your back, slowly slide your foot toward your buttocks, bending your knee up off the floor (only go as far as is comfortable). Then slowly slide your foot back down until your leg is flat on the floor again. °· Angel Wings: Lying on your back spread your legs to the side as far apart as you can without causing discomfort.  °A rehabilitation program following serious knee injuries can speed recovery and prevent re-injury in the future due to weakened muscles. Contact your doctor or a physical therapist for more information on knee rehabilitation.  ° °IF YOU ARE TRANSFERRED TO A SKILLED REHAB FACILITY °If the patient is transferred to a skilled rehab facility following release from the hospital, a list of the current medications will be sent to the facility for the patient to continue.  When discharged from the skilled rehab facility, please have the facility set up the patient's Home Health Physical Therapy prior to being released. Also, the skilled facility will be responsible for providing the patient with their medications at time of release from the facility to include their pain medication, the muscle relaxants, and their blood thinner medication. If the patient is still at the rehab facility at time of the two week follow up appointment, the skilled rehab facility will also need to assist the patient in arranging follow up appointment in our office and any transportation needs. ° °MAKE SURE YOU:  °Understand these instructions.  °Get help right away if you are not doing well or get worse.  ° ° °Pick up stool softner and laxative for home use following surgery while on pain medications. °Do not submerge incision under water. °Please use good hand washing techniques while changing dressing each day. °May shower starting three days after surgery. °Please use a clean towel to pat the incision dry following  showers. °Continue to use ice for pain and swelling after surgery. °Do not use any lotions or creams on the incision until instructed by your surgeon. ° °Take Xarelto for two and a half more weeks, then discontinue Xarelto. °Once the patient has completed the blood thinner regimen, then take a Baby 81 mg Aspirin daily for three more weeks. ° °

## 2015-09-11 NOTE — Evaluation (Signed)
Physical Therapy Evaluation Patient Details Name: Seth [name] MRN: [id number] DOB: [birth year] Today's Date: 09/11/2015   History of Present Illness  Pt is a 75 year old male s/p R TKA  Clinical Impression  Pt is s/p R TKA resulting in the deficits listed below (see PT Problem List).  Pt will benefit from skilled PT to increase their independence and safety with mobility to allow discharge to the venue listed below.  Pt assisted with short distance ambulation and performed LE exercises POD #1.  Pt plans to d/c home with spouse.     Follow Up Recommendations Home health PT;Supervision for mobility/OOB    Equipment Recommendations  None recommended by PT    Recommendations for Other Services       Precautions / Restrictions Precautions Precautions: Fall;Knee Required Braces or Orthoses: Knee Immobilizer - Right Knee Immobilizer - Right: Discontinue once straight leg raise with < 10 degree lag Restrictions Weight Bearing Restrictions: No Other Position/Activity Restrictions: WBAT      Mobility  Bed Mobility Overal bed mobility: Needs Assistance Bed Mobility: Supine to Sit     Supine to sit: Min assist     General bed mobility comments: assist for R LE, verbal cues for self assist  Transfers Overall transfer level: Needs assistance Equipment used: Rolling walker (2 wheeled) Transfers: Sit to/from Stand Sit to Stand: Min assist         General transfer comment: verbal cues for safe technique including UE and LE positioning  Ambulation/Gait Ambulation/Gait assistance: Min assist Ambulation Distance (Feet): 80 Feet Assistive device: Rolling walker (2 wheeled) Gait Pattern/deviations: Step-through pattern;Decreased stride length;Antalgic;Trunk flexed     General Gait Details: verbal cues for step to pattern however pt performing step through, increased cues for posture and staying within [employer]            Wheelchair Mobility    Modified Rankin  (Stroke Patients Only)       Balance                                             Pertinent Vitals/Pain Pain Assessment: 0-10 Pain Score: 5  Pain Location: R knee Pain Descriptors / Indicators: Aching;Sore Pain Intervention(s): Limited activity within patient's tolerance;Monitored during session;Repositioned    Home Living Family/patient expects to be discharged to:: Private residence Living Arrangements: Spouse/significant other   Type of Home: House Home Access: Stairs to enter   Technical brewer of Steps: 2 Home Layout: One level Home Equipment: Environmental consultant - 2 wheels      Prior Function Level of Independence: Independent               Hand Dominance        Extremity/Trunk Assessment               Lower Extremity Assessment: RLE deficits/detail RLE Deficits / Details: unable to perform SLR, good quad contraction, AAROM knee flexion 55*       Communication   Communication: No difficulties  Cognition Arousal/Alertness: Awake/alert Behavior During Therapy: WFL for tasks assessed/performed Overall Cognitive Status: Within Functional Limits for tasks assessed                      General Comments      Exercises Total Joint Exercises Ankle Circles/Pumps: AROM;Both;10 reps Quad Sets: AROM;Both;10 reps Short Arc Quad: AROM;Right;10 reps  Heel Slides: AAROM;Right;10 reps Hip ABduction/ADduction: 10 reps;AROM;Right Straight Leg Raises: Right;AAROM;10 reps      Assessment/Plan    PT Assessment Patient needs continued PT services  PT Diagnosis Difficulty walking;Acute pain   PT Problem List Decreased strength;Decreased range of motion;Decreased mobility;Decreased safety awareness;Decreased knowledge of precautions;Decreased knowledge of use of DME;Pain  PT Treatment Interventions DME instruction;Functional mobility training;Therapeutic exercise;Therapeutic activities;Patient/family education;Gait training;Stair training    PT Goals (Current goals can be found in the Care Plan section) Acute Rehab PT Goals PT Goal Formulation: With patient Time For Goal Achievement: 09/15/15 Potential to Achieve Goals: Good    Frequency 7X/week   Barriers to discharge        Co-evaluation               End of Session Equipment Utilized During Treatment: Gait belt Activity Tolerance: Patient tolerated treatment well Patient left: in chair;with call bell/phone within reach;with family/visitor present Nurse Communication: Mobility status         Time: 1027-1100 PT Time Calculation (min) (ACUTE ONLY): 33 min   Charges:   PT Evaluation $PT Eval Moderate Complexity: 1 Procedure PT Treatments $Therapeutic Exercise: 8-22 mins   PT G Codes:        Asa Fath,KATHrine E 09/11/2015, 12:06 PM Carmelia Bake, PT, DPT 09/11/2015 Pager: [phone]

## 2015-09-11 NOTE — Progress Notes (Signed)
   Subjective: 1 Day Post-Op Procedure(s) (LRB): RIGHT TOTAL KNEE ARTHROPLASTY (Right) Patient reports pain as mild and moderate.   Patient seen in rounds with Dr. Wynelle Link. Family in room. Patient is well, but has had some minor complaints of pain in the knee, requiring pain medications We will start therapy today.  Plan is to go Home after hospital stay.  Objective: Vital signs in last 24 hours: Temp:  [97.6 F (36.4 C)-98.3 F (36.8 C)] 97.8 F (36.6 C) (01/31 [id number]) Pulse Rate:  [88-113] 99 (01/31 0613) Resp:  [13-36] 15 (01/31 0613) BP: (116-152)/(77-104) 126/86 mmHg (01/31 0613) SpO2:  [94 %-100 %] 98 % (01/31 0613) Weight:  [117.482 kg (259 lb)] 117.482 kg (259 lb) (01/30 1232)  Intake/Output from previous day:  Intake/Output Summary (Last 24 hours) at 09/11/15 0719 Last data filed at 09/11/15 0614  Gross per 24 hour  Intake 3611.67 ml  Output   1095 ml  Net 2516.67 ml    Intake/Output this shift: UOP 500  Labs:  Recent Labs  09/11/15 0453  HGB 12.4*    Recent Labs  09/11/15 0453  WBC 9.3  RBC 4.48  HCT 39.1  PLT 239    Recent Labs  09/11/15 0453  NA 134*  K 4.5  CL 103  CO2 23  BUN 10  CREATININE 1.08  GLUCOSE 176*  CALCIUM 8.6*   No results for input(s): LABPT, INR in the last 72 hours.  EXAM General - Patient is Alert, Appropriate and Oriented Extremity - Neurovascular intact Sensation intact distally Dorsiflexion/Plantar flexion intact Dressing - dressing C/D/I Motor Function - intact, moving foot and toes well on exam.  Hemovac pulled without difficulty.  Past Medical History  Diagnosis Date  . Hypertension   . Pneumonia     06/2015   . Arthritis   . Cancer ([place])     hx of prostate cancer     Assessment/Plan: 1 Day Post-Op Procedure(s) (LRB): RIGHT TOTAL KNEE ARTHROPLASTY (Right) Principal Problem:   OA (osteoarthritis) of knee  Estimated body mass index is 35.62 kg/(m^2) as calculated from the following:   Height as  of this encounter: 5' 11.5" (1.816 m).   Weight as of this encounter: 117.482 kg (259 lb). Advance diet Up with therapy Discharge home with home health  DVT Prophylaxis - Xarelto Weight-Bearing as tolerated to right leg D/C O2 and Pulse OX and try on Room Air  Arlee Muslim, PA-C Orthopaedic Surgery 09/11/2015, 7:19 AM

## 2015-09-12 LAB — BASIC METABOLIC PANEL
ANION GAP: 7 (ref 5–15)
BUN: 14 mg/dL (ref 6–20)
CALCIUM: 8.6 mg/dL — AB (ref 8.9–10.3)
CHLORIDE: 103 mmol/L (ref 101–111)
CO2: 25 mmol/L (ref 22–32)
Creatinine, Ser: 1.02 mg/dL (ref 0.61–1.24)
GFR calc non Af Amer: >60 mL/min (ref >60)
Glucose, Bld: 129 mg/dL — ABNORMAL HIGH (ref 65–99)
Potassium: 4.3 mmol/L (ref 3.5–5.1)
Sodium: 135 mmol/L (ref 135–145)

## 2015-09-12 LAB — CBC
HEMATOCRIT: 35.2 % — AB (ref 39.0–52.0)
HEMOGLOBIN: 11.4 g/dL — AB (ref 13.0–17.0)
MCH: 28 pg (ref 26.0–34.0)
MCHC: 32.4 g/dL (ref 30.0–36.0)
MCV: 86.5 fL (ref 78.0–100.0)
Platelets: 224 10*3/uL (ref 150–400)
RBC: 4.07 MIL/uL — ABNORMAL LOW (ref 4.22–5.81)
RDW: 16.3 % — AB (ref 11.5–15.5)
WBC: 17 10*3/uL — AB (ref 4.0–10.5)

## 2015-09-12 MED ORDER — RIVAROXABAN 10 MG PO TABS: 10.0000 mg | ORAL_TABLET | Freq: Every day | ORAL | Status: DC

## 2015-09-12 MED ORDER — CYCLOBENZAPRINE HCL 10 MG PO TABS: 10.0000 mg | ORAL_TABLET | Freq: Three times a day (TID) | ORAL | Status: DC | PRN

## 2015-09-12 MED ORDER — HYDROMORPHONE HCL 2 MG PO TABS
2.0000 mg | ORAL_TABLET | ORAL | Status: DC | PRN
  Administered 2015-09-12: 2 mg via ORAL
  Filled 2015-09-12: qty 1

## 2015-09-12 MED ORDER — HYDROMORPHONE HCL 2 MG PO TABS: 2.0000 mg | ORAL_TABLET | ORAL | Status: DC | PRN

## 2015-09-12 MED ORDER — TRAMADOL HCL 50 MG PO TABS: 50.0000 mg | ORAL_TABLET | Freq: Four times a day (QID) | ORAL | Status: DC | PRN

## 2015-09-12 NOTE — Progress Notes (Signed)
Occupational Therapy Treatment Patient Details Name: Seth [name] MRN: [id number] DOB: [birth year] Today's Date: 09/12/2015    History of present illness Pt is a 75 year old male s/p R TKA   OT comments  Pt much more impulsive today. Did family education with wife, but recommend STSNF for rehab for safety  Follow Up Recommendations  SNF    Equipment Recommendations   (pt has BSC)    Recommendations for Other Services      Precautions / Restrictions Precautions Precautions: Fall;Knee Knee Immobilizer - Right: Discontinue once straight leg raise with < 10 degree lag Restrictions Weight Bearing Restrictions: No Other Position/Activity Restrictions: WBAT       Mobility Bed Mobility               General bed mobility comments: oob  Transfers   Equipment used: Rolling walker (2 wheeled) Transfers: Sit to/from Stand Sit to Stand: Min guard         General transfer comment: max multimodal cues for safety.  Pt very impulsive and difficulty with sequencing    Balance                                   ADL               Lower Body Bathing: Minimal assistance;Sit to/from stand       Lower Body Dressing: Maximal assistance;Sit to/from stand   Toilet Transfer: Minimal assistance;BSC;RW;Ambulation             General ADL Comments: Did family education with wife.  Pt was impulsive and not processing cues well:  multimodal cues given for safety and sequencing when walking.  Wife assisted with LB adls, donning KI, and ambulating.  She is able to cue him with extra time.        Vision                     Perception     Praxis      Cognition   Behavior During Therapy: WFL for tasks assessed/performed Overall Cognitive Status: Within Functional Limits for tasks assessed                       Extremity/Trunk Assessment               Exercises     Shoulder Instructions       General Comments       Pertinent Vitals/ Pain       Pain Score: 5  Pain Location: R knee Pain Descriptors / Indicators: Sore Pain Intervention(s): Limited activity within patient's tolerance;Monitored during session;Premedicated before session;Repositioned  Home Living                                          Prior Functioning/Environment              Frequency Min 2X/week     Progress Toward Goals  OT Goals(current goals can now be found in the care plan section)  Progress towards OT goals: Not progressing toward goals - comment (more impulsive today)     Plan Discharge plan needs to be updated    Co-evaluation                 End of  Session     Activity Tolerance  (pt needed rest breaks:  gets dyspnea (2/4))   Patient Left in chair;with call bell/phone within reach;with family/visitor present   Nurse Communication          Time: [id number] OT Time Calculation (min): 35 min  Charges: OT General Charges $OT Visit: 1 Procedure OT Treatments $Self Care/Home Management : 23-37 mins  Arneda [place]ton 09/12/2015, 10:35 AM   Lesle Chris, OTR/L [phone] 09/12/2015

## 2015-09-12 NOTE — Progress Notes (Signed)
Physical Therapy Treatment Patient Details Name: Seth [name] MRN: [id number] DOB: [birth year] Today's Date: 09/12/2015    History of Present Illness Pt is a 75 year old male s/p R TKA    PT Comments    Pt progressing steadily with mobility.  Pt and spouse hopeful for dc this pm.  Reviewed don/doff KI this am and will review stairs this pm.  Follow Up Recommendations  Home health PT;Supervision for mobility/OOB     Equipment Recommendations  None recommended by PT    Recommendations for Other Services       Precautions / Restrictions Precautions Precautions: Fall;Knee Knee Immobilizer - Right: Discontinue once straight leg raise with < 10 degree lag Restrictions Weight Bearing Restrictions: No Other Position/Activity Restrictions: WBAT    Mobility  Bed Mobility               General bed mobility comments: oob  Transfers Overall transfer level: Needs assistance Equipment used: Rolling walker (2 wheeled) Transfers: Sit to/from Stand Sit to Stand: Min guard         General transfer comment: cues for LE management and use of UEs to self assist.  Better saftey awareness vs OT session and with spouse also cueing pt  Ambulation/Gait Ambulation/Gait assistance: Min guard;Supervision Ambulation Distance (Feet): 111 Feet Assistive device: Rolling walker (2 wheeled) Gait Pattern/deviations: Step-to pattern;Decreased step length - right;Decreased step length - left;Shuffle;Trunk flexed Gait velocity: decr Gait velocity interpretation: Below normal speed for age/gender General Gait Details: verbal cues for step length, RW positioning and posture, tends to keep RW too far away   Stairs            Wheelchair Mobility    Modified Rankin (Stroke Patients Only)       Balance                                    Cognition Arousal/Alertness: Awake/alert Behavior During Therapy: WFL for tasks assessed/performed Overall Cognitive Status:  Within Functional Limits for tasks assessed                      Exercises Total Joint Exercises Ankle Circles/Pumps: AROM;Both;20 reps;Supine Quad Sets: AROM;Both;20 reps;Supine Heel Slides: AAROM;Right;20 reps;Supine Straight Leg Raises: Right;AAROM;20 reps;Supine    General Comments        Pertinent Vitals/Pain Pain Assessment: 0-10 Pain Score: 4  Pain Location: R knee Pain Descriptors / Indicators: Aching;Sore Pain Intervention(s): Limited activity within patient's tolerance;Monitored during session;Premedicated before session;Ice applied    Home Living                      Prior Function            PT Goals (current goals can now be found in the care plan section) Acute Rehab PT Goals Patient Stated Goal: get back to being independent PT Goal Formulation: With patient Time For Goal Achievement: 09/15/15 Potential to Achieve Goals: Good Progress towards PT goals: Progressing toward goals    Frequency  7X/week    PT Plan Current plan remains appropriate    Co-evaluation             End of Session Equipment Utilized During Treatment: Gait belt Activity Tolerance: Patient tolerated treatment well Patient left: in chair;with call bell/phone within reach;with family/visitor present     Time: 1102-1129 PT Time Calculation (min) (ACUTE ONLY): 27 min  Charges:  $Gait Training: 8-22 mins $Therapeutic Exercise: 8-22 mins                    G Codes:      Seth [name] 09/20/2015, 1:13 PM

## 2015-09-12 NOTE — Progress Notes (Signed)
Physical Therapy Treatment Patient Details Name: Seth [name] MRN: [id number] DOB: [birth year] Today's Date: 09/12/2015    History of Present Illness Pt is a 75 year old male s/p R TKA    PT Comments    Pt progressing with mobility and eager for return home - pt states son lives with and has had his knee replaced. Pt spouse present and reviewed don/doff KI, ice packs, stairs and safety awareness.  Follow Up Recommendations  Home health PT;Supervision for mobility/OOB     Equipment Recommendations  None recommended by PT    Recommendations for Other Services       Precautions / Restrictions Precautions Precautions: Fall;Knee Knee Immobilizer - Right: Discontinue once straight leg raise with < 10 degree lag Restrictions Weight Bearing Restrictions: No Other Position/Activity Restrictions: WBAT    Mobility  Bed Mobility               General bed mobility comments: Pt OOB and declines to attempt back to bed  Transfers Overall transfer level: Needs assistance Equipment used: Rolling walker (2 wheeled) Transfers: Sit to/from Stand Sit to Stand: Min guard;Supervision         General transfer comment: cues for saftey, LE management and use of UEs to self assist  Ambulation/Gait Ambulation/Gait assistance: Min guard;Supervision Ambulation Distance (Feet): 111 Feet Assistive device: Rolling walker (2 wheeled) Gait Pattern/deviations: Step-to pattern;Decreased step length - right;Decreased step length - left;Shuffle;Trunk flexed Gait velocity: decr   General Gait Details: cues for sequence, posture and position from RW   Stairs Stairs: Yes Stairs assistance: Min assist Stair Management: No rails;Step to pattern;Forwards Number of Stairs: 3 General stair comments: single step 3x with RW and cues for sequence and foot/RW placement.  Wheelchair Mobility    Modified Rankin (Stroke Patients Only)       Balance                                    Cognition Arousal/Alertness: Awake/alert Behavior During Therapy: WFL for tasks assessed/performed Overall Cognitive Status: Within Functional Limits for tasks assessed                      Exercises      General Comments        Pertinent Vitals/Pain Pain Assessment: 0-10 Pain Score: 4  Pain Location: R knee Pain Descriptors / Indicators: Aching;Sore Pain Intervention(s): Limited activity within patient's tolerance;Monitored during session;Premedicated before session;Ice applied    Home Living                      Prior Function            PT Goals (current goals can now be found in the care plan section) Acute Rehab PT Goals Patient Stated Goal: get back to being independent PT Goal Formulation: With patient Time For Goal Achievement: 09/15/15 Potential to Achieve Goals: Good Progress towards PT goals: Progressing toward goals    Frequency  7X/week    PT Plan Current plan remains appropriate    Co-evaluation             End of Session Equipment Utilized During Treatment: Gait belt Activity Tolerance: Patient tolerated treatment well Patient left: in chair;with call bell/phone within reach;with family/visitor present     Time: [id number] PT Time Calculation (min) (ACUTE ONLY): 25 min  Charges:  $Gait Training: 8-22 mins $Therapeutic Activity:  8-22 mins                    G Codes:      Seth [name] September 22, 2015, 5:23 PM

## 2015-09-12 NOTE — Progress Notes (Signed)
   Subjective: 2 Days Post-Op Procedure(s) (LRB): RIGHT TOTAL KNEE ARTHROPLASTY (Right) Patient reports pain as mild.   Patient seen in rounds by Dr. Wynelle Link. Patient is well, but has had some minor complaints of pain in the knee, requiring pain medications Patient is ready to go home later today following therapy  Objective: Vital signs in last 24 hours: Temp:  [98.3 F (36.8 C)-98.8 F (37.1 C)] 98.8 F (37.1 C) (02/01 0419) Pulse Rate:  [93-106] 93 (02/01 0419) Resp:  [18] 18 (02/01 0419) BP: (124-155)/(76-96) 124/87 mmHg (02/01 1030) SpO2:  [96 %-98 %] 98 % (02/01 0419)  Intake/Output from previous day:  Intake/Output Summary (Last 24 hours) at 09/12/15 1051 Last data filed at 09/12/15 0900  Gross per 24 hour  Intake 836.67 ml  Output   1700 ml  Net -863.33 ml    Intake/Output this shift: Total I/O In: 240 [P.O.:240] Out: 200 [Urine:200]  Labs:  Recent Labs  09/11/15 0453 09/12/15 0457  HGB 12.4* 11.4*    Recent Labs  09/11/15 0453 09/12/15 0457  WBC 9.3 17.0*  RBC 4.48 4.07*  HCT 39.1 35.2*  PLT 239 224    Recent Labs  09/11/15 0453 09/12/15 0457  NA 134* 135  K 4.5 4.3  CL 103 103  CO2 23 25  BUN 10 14  CREATININE 1.08 1.02  GLUCOSE 176* 129*  CALCIUM 8.6* 8.6*   No results for input(s): LABPT, INR in the last 72 hours.  EXAM: General - Patient is Alert, Appropriate and Oriented Extremity - Neurovascular intact Sensation intact distally Dorsiflexion/Plantar flexion intact Incision - clean, dry, no drainage, healing Motor Function - intact, moving foot and toes well on exam.   Assessment/Plan: 2 Days Post-Op Procedure(s) (LRB): RIGHT TOTAL KNEE ARTHROPLASTY (Right) Procedure(s) (LRB): RIGHT TOTAL KNEE ARTHROPLASTY (Right) Past Medical History  Diagnosis Date  . Hypertension   . Pneumonia     06/2015   . Arthritis   . Cancer ([place])     hx of prostate cancer    Principal Problem:   OA (osteoarthritis) of knee  Estimated  body mass index is 35.62 kg/(m^2) as calculated from the following:   Height as of this encounter: 5' 11.5" (1.816 m).   Weight as of this encounter: 117.482 kg (259 lb). Up with therapy Discharge home with home health Diet - Cardiac diet Follow up - in 2 weeks Activity - WBAT Disposition - Home Condition Upon Discharge - Stable D/C Meds - See DC Summary DVT Prophylaxis - Xarelto  Arlee Muslim, PA-C Orthopaedic Surgery 09/12/2015, 10:51 AM

## 2015-10-09 ENCOUNTER — Ambulatory Visit: Payer: Medicare Other | Attending: Orthopedic Surgery | Admitting: Physical Therapy

## 2015-10-09 DIAGNOSIS — M25561 Pain in right knee: Secondary | ICD-10-CM | POA: Diagnosis present

## 2015-10-09 DIAGNOSIS — R262 Difficulty in walking, not elsewhere classified: Secondary | ICD-10-CM | POA: Diagnosis present

## 2015-10-09 DIAGNOSIS — M7989 Other specified soft tissue disorders: Secondary | ICD-10-CM | POA: Insufficient documentation

## 2015-10-09 DIAGNOSIS — M25661 Stiffness of right knee, not elsewhere classified: Secondary | ICD-10-CM | POA: Insufficient documentation

## 2015-10-09 NOTE — Therapy (Signed)
[facility] [address]  [place], [place], [address] Phone: [phone]   Fax:  [phone]  Physical Therapy Evaluation  Patient Details  Name: Seth [name] MRN: [id number] Date of Birth: 1941/06/29 Referring Provider: Wynelle Link  Encounter Date: 10/09/2015      PT End of Session - 10/09/15 1132    Visit Number 1   Date for PT Re-Evaluation 12/07/15   PT Start Time 1050   PT Stop Time 1135   PT Time Calculation (min) 45 min   Activity Tolerance Patient tolerated treatment well   Behavior During Therapy [facility] for tasks assessed/performed      Past Medical History  Diagnosis Date  . Hypertension   . Pneumonia     06/2015   . Arthritis   . Cancer ([place])     hx of prostate cancer     Past Surgical History  Procedure Laterality Date  . Prostatectomy    . 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  Visit Diagnosis:  Right knee pain - Plan: PT plan of care cert/re-cert  Knee stiffness, right - Plan: PT plan of care cert/re-cert  Difficulty walking - Plan: PT plan of care cert/re-cert  Swelling of limb - Plan: PT plan of care cert/re-cert      Subjective Assessment - 10/09/15 1055    Subjective Patient underwent a right TKR on 09/10/15.  Reports that he had home PT for 3 weeks, he reports that he felt he progressed.     Limitations Sitting;Lifting;Standing;Walking;House hold activities   Patient Stated Goals I want to get back to riding horses   Currently in Pain? Yes   Pain Score 3    Pain Location Knee   Pain Orientation Right;Anterior   Pain Descriptors / Indicators Aching;Sore   Pain Type Surgical pain   Pain Onset Yesterday   Pain Frequency Constant   Aggravating Factors  bending the knee will cause pain up to 6/10    Pain Relieving Factors rest pain can be 1/10   Effect of Pain on Daily Activities just difficulty with ADL's            [facility] PT Assessment - 10/09/15 0001    Assessment   Medical Diagnosis right TKR   Referring Provider Aluisio   Onset Date/Surgical Date 09/10/15   Prior Therapy home PT   Precautions   Precautions None   Balance Screen   Has the patient fallen in the past 6 months No   Has the patient had a decrease in activity level because of a fear of falling?  No   Is the patient reluctant to leave their home because of a fear of falling?  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   Leisure rides horses   Observation/Other Assessments-Edema    Edema Circumferential   Circumferential Edema   Circumferential - Right 51 cm   Circumferential - Left  46.5   AROM   Right Knee Extension 19   Right Knee Flexion 99   PROM   Right Knee Extension 10  Right Knee Flexion 104   Strength   Overall Strength Comments 4-/5 with mild pain across the anterior knee   Palpation   Palpation comment scar is mobile, a little tender distally, patella is ballotable with fluid behind, has good ability to contract the quad   Special Tests    Special Tests --  quad lag   Ambulation/Gait   Gait Comments no assistive device used                           PT Education - 2015-10-16 1128    Education provided Yes   Education Details Instructed in low load long duration stretches for flexion and extension   Person(s) Educated Patient;Spouse   Methods Explanation;Demonstration   Comprehension Verbalized understanding;Returned demonstration;Verbal cues required          PT Short Term Goals - 10-16-2015 1136    PT SHORT TERM GOAL #1   Title independent with the low load  long duration stretches   Time 2   Period Weeks   Status New           PT Long Term Goals - 10-16-2015 1136    PT LONG TERM GOAL #1   Title decrease pain 50%   Time 8   Period Weeks   Status New   PT LONG TERM GOAL #2   Title increase AROM of the right knee to 3-115 degrees flexion   Time 8   Period Weeks   Status New   PT LONG TERM GOAL #3   Title go up and down stairs step over step   Time 8   Period Weeks   Status New   PT LONG TERM GOAL #4   Title decrease edema to within 2 cm of the left leg   Time 8   Period Weeks   Status New   PT LONG TERM GOAL #5   Title increase strength to 4+/5 of the right knee   Time 8   Period Weeks   Status New               Plan - Oct 16, 2015 1132    Clinical Impression Statement Patient with right TKR on 09/10/15.  He is overall doing well, has some significant swelling in the right knee, AROM was 18-99 degrees flexion, walking without device, mild antalgic gait on the right.  The wife reports to me that he is not taking any pain meds and drove today.   Pt will benefit from skilled therapeutic intervention in order to improve on the following deficits Abnormal gait;Decreased range of motion;Decreased scar mobility;Decreased strength;Difficulty walking;Increased edema;Impaired flexibility;Increased fascial restricitons;Pain   Rehab Potential Good   PT Frequency 3x / week   PT Duration 8 weeks   PT Treatment/Interventions Cryotherapy;Electrical Stimulation;Vasopneumatic Device;Gait training;Stair training;Functional mobility training;Therapeutic activities;Therapeutic exercise;Manual techniques;Patient/family education   PT Next Visit Plan Add exercises to increase strength/ROM and function, manual techniques for increased ROM and could try the vaso for the swelling   Consulted and Agree with Plan of Care Patient          G-Codes - 10/16/2015 1138    Functional Assessment Tool Used foto  58% limitation   Functional Limitation  Mobility: Walking and moving around   Mobility: Walking and Moving Around Current Status [id number]) At least 40 percent but less than 60 percent impaired, limited or restricted   Mobility: Walking and Moving Around Goal Status [id number]) At least 20 percent  but less than 40 percent impaired, limited or restricted       Problem List Patient Active Problem List   Diagnosis Date Noted  . OA (osteoarthritis) of knee 09/10/2015    Sumner Boast., PT 10/09/2015, 11:40 AM  [facility] [address]  [place], [place], [address] Phone: [phone]   Fax:  [phone]  Name: NISHANT HOWORTH MRN: [id number] Date of Birth: [birth year]

## 2015-10-11 ENCOUNTER — Ambulatory Visit: Payer: Medicare Other | Attending: Orthopedic Surgery | Admitting: Physical Therapy

## 2015-10-11 DIAGNOSIS — M7989 Other specified soft tissue disorders: Secondary | ICD-10-CM | POA: Diagnosis present

## 2015-10-11 DIAGNOSIS — M25661 Stiffness of right knee, not elsewhere classified: Secondary | ICD-10-CM | POA: Diagnosis present

## 2015-10-11 DIAGNOSIS — M25561 Pain in right knee: Secondary | ICD-10-CM | POA: Diagnosis not present

## 2015-10-11 DIAGNOSIS — R262 Difficulty in walking, not elsewhere classified: Secondary | ICD-10-CM | POA: Diagnosis present

## 2015-10-11 NOTE — Therapy (Signed)
[facility] [address]  [place], [place], [address] Phone: [phone]   Fax:  [phone]  Physical Therapy Treatment  Patient Details  Name: Seth [name] MRN: [id number] Date of Birth: [birth year] Referring Provider: Wynelle Link  Encounter Date: 10/11/2015      PT End of Session - 10/11/15 1524    Visit Number 2   Date for PT Re-Evaluation 12/07/15   PT Start Time 1445   PT Stop Time 1539   PT Time Calculation (min) 54 min   Activity Tolerance Patient tolerated treatment well   Behavior During Therapy [facility] for tasks assessed/performed      Past Medical History  Diagnosis Date  . Hypertension   . Pneumonia     06/2015   . Arthritis   . Cancer ([place])     hx of prostate cancer     Past Surgical History  Procedure Laterality Date  . Prostatectomy    . 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  Visit Diagnosis:  Right knee pain  Knee stiffness, right  Difficulty walking  Swelling of limb      Subjective Assessment - 10/11/15 1449    Subjective feeling good today   Patient Stated Goals I want to get back to riding horses   Currently in Pain? Yes   Pain Score 5    Pain Location Knee   Pain Orientation Right   Pain Descriptors / Indicators Aching;Sharp   Pain Type Surgical pain   Pain Onset More than a month ago   Pain Frequency Constant   Aggravating Factors  bending the knee   Pain Relieving Factors resting                         [facility] Adult PT Treatment/Exercise - 10/11/15 1451    Exercises   Exercises Knee/Hip   Knee/Hip Exercises: Aerobic   Recumbent Bike L1 x 5 min   Knee/Hip Exercises: Machines for Strengthening   Cybex Knee Extension 10# 2x10   Cybex Knee Flexion 10# 2x10   Knee/Hip  Exercises: Supine   Short Arc Quad Sets Right;10 reps   Short Arc Quad Sets Limitations 3#   Heel Slides AAROM;Right;10 reps   Straight Leg Raises Right;10 reps   Straight Leg Raises Limitations cues to decrease extensor lag   Modalities   Modalities Vasopneumatic   Vasopneumatic   Number Minutes Vasopneumatic  15 minutes   Vasopnuematic Location  Knee   Vasopneumatic Pressure Medium   Vasopneumatic Temperature  max cold   Manual Therapy   Manual Therapy Joint mobilization   Joint Mobilization grade 2-3 inf/sup patella mobs; passive R knee ext stretch and A/P tibial glides grade 2-3 for extension                  PT Short Term Goals - 10/09/15 1136    PT SHORT TERM GOAL #1   Title independent with the low load long duration stretches   Time 2   Period Weeks   Status New           PT Long Term Goals - 10/09/15 1136    PT LONG TERM GOAL #1   Title decrease pain 50%   Time 8   Period  Weeks   Status New   PT LONG TERM GOAL #2   Title increase AROM of the right knee to 3-115 degrees flexion   Time 8   Period Weeks   Status New   PT LONG TERM GOAL #3   Title go up and down stairs step over step   Time 8   Period Weeks   Status New   PT LONG TERM GOAL #4   Title decrease edema to within 2 cm of the left leg   Time 8   Period Weeks   Status New   PT LONG TERM GOAL #5   Title increase strength to 4+/5 of the right knee   Time 8   Period Weeks   Status New               Plan - 10/11/15 1524    Clinical Impression Statement R knee flexion ROM improved to 112 degrees today; pt reports compliance with HEP.  Progressing well; and demonstrates mild quad lag with SLR   PT Next Visit Plan Add exercises to increase strength/ROM and function, manual techniques for increased ROM and could try the vaso for the swelling   Consulted and Agree with Plan of Care Patient        Problem List Patient Active Problem List   Diagnosis Date Noted  . OA  (osteoarthritis) of knee 09/10/2015   Laureen Abrahams, PT, DPT 10/11/2015 3:40 PM  [facility] [address]  [place], [place], [address] Phone: [phone]   Fax:  [phone]  Name: Seth [name] MRN: [id number] Date of Birth: Oct 17, 1940

## 2015-10-16 ENCOUNTER — Ambulatory Visit: Payer: Medicare Other

## 2015-10-16 DIAGNOSIS — M7989 Other specified soft tissue disorders: Secondary | ICD-10-CM

## 2015-10-16 DIAGNOSIS — M25661 Stiffness of right knee, not elsewhere classified: Secondary | ICD-10-CM

## 2015-10-16 DIAGNOSIS — R262 Difficulty in walking, not elsewhere classified: Secondary | ICD-10-CM

## 2015-10-16 DIAGNOSIS — M25561 Pain in right knee: Secondary | ICD-10-CM | POA: Diagnosis not present

## 2015-10-16 NOTE — Therapy (Signed)
[facility] [address]  [place], [place], [address] Phone: [phone]   Fax:  [phone]  Physical Therapy Treatment  Patient Details  Name: Seth [name] MRN: [id number] Date of Birth: 1941/01/25 Referring Provider: Wynelle Link  Encounter Date: 10/16/2015      PT End of Session - 10/16/15 1755    Visit Number 2   Date for PT Re-Evaluation 12/07/15   PT Start Time 1449   PT Stop Time 1530   PT Time Calculation (min) 41 min   Activity Tolerance Patient tolerated treatment well   Behavior During Therapy [facility] for tasks assessed/performed      Past Medical History  Diagnosis Date  . Hypertension   . Pneumonia     06/2015   . Arthritis   . Cancer ([place])     hx of prostate cancer     Past Surgical History  Procedure Laterality Date  . Prostatectomy    . 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  Visit Diagnosis:  Right knee pain  Knee stiffness, right  Difficulty walking  Swelling of limb      Subjective Assessment - 10/16/15 1456    Subjective 4/10 R knee pain currently.  no other pain or complaints reported.     Patient Stated Goals I want to get back to riding horses   Currently in Pain? Yes   Pain Score 4    Pain Location Knee   Pain Orientation Right   Pain Descriptors / Indicators Aching;Sharp   Pain Type Surgical pain   Pain Onset More than a month ago   Pain Frequency Constant   Aggravating Factors  bending the knee.    Multiple Pain Sites No          [facility] PT Assessment - 10/16/15 1511    AROM   Right Knee Extension 7   Right Knee Flexion 109   PROM   Right Knee Extension 6   Right Knee Flexion 115      Today's treatment  Therex:  R HS stretch x 30 sec  Bridging x 10 reps  Bridging with  isometric / ER 2 x 3 reps  Clam shell with blue TB x 10 reps R side-lying clam shell with blue TB x 10 reps L side-lying clam shell with blue TB x 10 reps Standing PF stretch x 30 sec  Vasopneumatic compression to L knee (pillow case under cuff), medium compression, lowest temp x15'; L LE elevated on bolster Ice pack to L ankle x15'        PT Short Term Goals - 10/09/15 1136    PT SHORT TERM GOAL #1   Title independent with the low load long duration stretches   Time 2   Period Weeks   Status New          PT Long Term Goals - 10/09/15 1136    PT LONG TERM GOAL #1   Title decrease pain 50%   Time 8   Period Weeks   Status New   PT LONG TERM GOAL #2   Title increase AROM of the right knee to 3-115 degrees flexion   Time 8   Period Weeks   Status New   PT LONG TERM GOAL #3   Title  go up and down stairs step over step   Time 8   Period Weeks   Status New   PT LONG TERM GOAL #4   Title decrease edema to within 2 cm of the left leg   Time 8   Period Weeks   Status New   PT LONG TERM GOAL #5   Title increase strength to 4+/5 of the right knee   Time 8   Period Weeks   Status New          Plan - 10/16/15 1756    Clinical Impression Statement R knee flexion PROM improved today to 115 degrees; Today's visit focused on R quad strengthening with addition of fitter and therex instruction to improve pt. understanding of HEP.     PT Next Visit Plan Add exercises to increase strength/ROM and function, manual techniques for increased ROM.     Problem List Patient Active Problem List   Diagnosis Date Noted  . OA (osteoarthritis) of knee 09/10/2015    Bess Harvest, PTA 10/16/2015, 6:00 PM  [facility] [address]  [place], [place], [address] Phone: [phone]   Fax:  [phone]  Name: Seth [name] MRN: [id number] Date of Birth: 07-13-1941

## 2015-10-19 ENCOUNTER — Ambulatory Visit: Payer: Medicare Other | Admitting: Physical Therapy

## 2015-10-23 ENCOUNTER — Ambulatory Visit: Payer: Medicare Other

## 2015-10-23 DIAGNOSIS — M25561 Pain in right knee: Secondary | ICD-10-CM

## 2015-10-23 DIAGNOSIS — M7989 Other specified soft tissue disorders: Secondary | ICD-10-CM

## 2015-10-23 DIAGNOSIS — M25661 Stiffness of right knee, not elsewhere classified: Secondary | ICD-10-CM

## 2015-10-23 DIAGNOSIS — R262 Difficulty in walking, not elsewhere classified: Secondary | ICD-10-CM

## 2015-10-23 NOTE — Therapy (Signed)
[facility] [address]  [place], [place], [address] Phone: [phone]   Fax:  [phone]  Physical Therapy Treatment  Patient Details  Name: Seth [name] MRN: [id number] Date of Birth: 02/04/1941 Referring Provider: Wynelle Link  Encounter Date: 10/23/2015      PT End of Session - 10/23/15 1103    Visit Number 4   Number of Visits 24   Date for PT Re-Evaluation 12/07/15   PT Start Time 1103   PT Stop Time 1156   PT Time Calculation (min) 53 min   Activity Tolerance Patient tolerated treatment well   Behavior During Therapy [facility] for tasks assessed/performed      Past Medical History  Diagnosis Date  . Hypertension   . Pneumonia     06/2015   . Arthritis   . Cancer ([place])     hx of prostate cancer     Past Surgical History  Procedure Laterality Date  . Prostatectomy    . 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  Visit Diagnosis:  Knee stiffness, right  Difficulty walking  Swelling of limb  Right knee pain      Subjective Assessment - 10/23/15 1147    Subjective 4/10 R knee pain currently.  no other pain or complaints reported.     Patient Stated Goals I want to get back to riding horses   Currently in Pain? Yes   Pain Score 4    Pain Location Knee   Pain Orientation Right   Pain Descriptors / Indicators Aching   Pain Type Surgical pain   Pain Radiating Towards n/a   Pain Onset More than a month ago   Pain Frequency Constant   Aggravating Factors  Bending the knee   Pain Relieving Factors resting   Multiple Pain Sites No            [facility] PT Assessment - 10/23/15 0001    AROM   Right Knee Extension 6   Right Knee Flexion 108   PROM   Right Knee Extension 4   Right Knee Flexion 119            Today's treatment  Therex:  R HS, PF stretch 2 x 30 sec  Bridging x 10 reps  Bridging with isometric / ER 5 x 3 reps  Clam shell with blue TB x 10 reps L side-lying clam shell with blue TB x 15 reps Seated R leg press knee ext. Fitter (1 black / 1 blue) 2 x 10 reps Standing dorsiflexion rocker stretch 2 x 30 sec Standing R terminal knee ext. With black TB in door x 15 reps  Vasopneumatic compression to L knee (pillow case under cuff), medium compression, lowest temp x15'; L LE elevated on bolster I              PT Short Term Goals - 10/23/15 1103    PT SHORT TERM GOAL #1   Title independent with the low load long duration stretches  10/23/15: Pt. demo'd I with low load long duration stretches.     Time 2   Period Weeks   Status Achieved           PT Long Term Goals - 10/23/15 1204    PT LONG TERM  GOAL #1   Title decrease pain 50%   Time 8   Period Weeks   Status On-going   PT LONG TERM GOAL #2   Title increase AROM of the right knee to 3-115 degrees flexion   Time 8   Period Weeks   Status On-going   PT LONG TERM GOAL #3   Title go up and down stairs step over step   Time 8   Period Weeks   Status On-going   PT LONG TERM GOAL #4   Title decrease edema to within 2 cm of the left leg   Time 8   Period Weeks   Status On-going   PT LONG TERM GOAL #5   Title increase strength to 4+/5 of the right knee   Time 8   Period Weeks   Status On-going               Plan - 10/23/15 1104    Clinical Impression Statement Pt. tolerated all hip / LE strengthening / ROM activities well today with only mild pain increase.  Pt. AROM 6-108 dg, and PROM 4-119 today, much improved from last treatment.  hip strengthening activites added to HEP program today.  R terminal knee strengthening activities advanced today; pt. tolerated addition well.     PT Next Visit Plan Add exercises to increase strength/ROM and function, manual techniques for increased ROM.         Problem List Patient Active Problem List   Diagnosis Date Noted  . OA (osteoarthritis) of knee 09/10/2015    Bess Harvest, PTA 10/23/2015, 12:05 PM  [facility] [address]  [place], [place], [address] Phone: [phone]   Fax:  [phone]  Name: Seth [name] MRN: [id number] Date of Birth: Dec 06, 1940

## 2015-10-25 ENCOUNTER — Ambulatory Visit: Payer: Medicare Other

## 2015-10-25 DIAGNOSIS — M25561 Pain in right knee: Secondary | ICD-10-CM | POA: Diagnosis not present

## 2015-10-25 DIAGNOSIS — M7989 Other specified soft tissue disorders: Secondary | ICD-10-CM

## 2015-10-25 DIAGNOSIS — M25661 Stiffness of right knee, not elsewhere classified: Secondary | ICD-10-CM

## 2015-10-25 DIAGNOSIS — R262 Difficulty in walking, not elsewhere classified: Secondary | ICD-10-CM

## 2015-10-25 NOTE — Therapy (Signed)
[facility] [address]  [place], [place], [address] Phone: [phone]   Fax:  [phone]  Physical Therapy Treatment  Patient Details  Name: Seth [name] MRN: [id number] Date of Birth: [birth year] Referring Provider: Wynelle Link  Encounter Date: 10/25/2015      PT End of Session - 10/25/15 1108    Visit Number 5   Number of Visits 24   Date for PT Re-Evaluation 12/07/15   PT Start Time 1102   PT Stop Time 1157   PT Time Calculation (min) 55 min   Activity Tolerance Patient tolerated treatment well   Behavior During Therapy [facility] for tasks assessed/performed      Past Medical History  Diagnosis Date  . Hypertension   . Pneumonia     06/2015   . Arthritis   . Cancer ([place])     hx of prostate cancer     Past Surgical History  Procedure Laterality Date  . Prostatectomy    . 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  Visit Diagnosis:  Knee stiffness, right  Difficulty walking  Swelling of limb  Right knee pain      Subjective Assessment - 10/25/15 1110    Subjective 6/10 R knee pain currently with mild swelling in anteriolateral R knee.  Pt. reports, "I was really trying to bend my knee with that dog leash at home and i felt a pop and a lot of pain.".   Currently in Pain? Yes   Pain Score 6    Pain Location Knee   Pain Orientation Right   Pain Descriptors / Indicators Aching   Pain Type Surgical pain   Pain Radiating Towards n/a   Pain Onset More than a month ago   Pain Frequency Constant   Aggravating Factors  bending the knee    Pain Relieving Factors resting   Multiple Pain Sites No       Today's treatment  Therex:  R HS, PF stretch 2 x 30 sec  Bridging x 10 reps  Bridging with isometric / ER 3 x 5  reps  L side lying hip abduction x 15 reps Hooklying Clam shell with black TB x 10 reps  Leg curl machine x 15 reps # 25 Seated R leg press knee ext. Fitter (1 black / 1 blue) x 10 reps Standing dorsiflexion rocker stretch 2 x 30 sec Standing R terminal knee ext. With black TB in door x 15 reps  Alternating step over 2" half white bolster x 10 each leg   Vasopneumatic compression to L knee (pillow case under cuff), medium compression, lowest temp x15'; L LE elevated on bolster     [facility] PT Assessment - 10/25/15 1146    PROM   Right Knee Extension 10   Right Knee Flexion 117                     PT Short Term Goals - 10/23/15 1103    PT SHORT TERM GOAL #1   Title independent with the low load long duration stretches  10/23/15: Pt. demo'd I with low load long duration stretches.     Time 2   Period Weeks   Status Achieved  PT Long Term Goals - 10/23/15 1204    PT LONG TERM GOAL #1   Title decrease pain 50%   Time 8   Period Weeks   Status On-going   PT LONG TERM GOAL #2   Title increase AROM of the right knee to 3-115 degrees flexion   Time 8   Period Weeks   Status On-going   PT LONG TERM GOAL #3   Title go up and down stairs step over step   Time 8   Period Weeks   Status On-going   PT LONG TERM GOAL #4   Title decrease edema to within 2 cm of the left leg   Time 8   Period Weeks   Status On-going   PT LONG TERM GOAL #5   Title increase strength to 4+/5 of the right knee   Time 8   Period Weeks   Status On-going               Plan - 10/25/15 1108    Clinical Impression Statement Pt. with increased R knee swelling this treatment initially reporting that he over did the knee flexion stretch at home.  Alternating TKE step over with 2" bolster added today; pt. tolerated this well without increase in R knee pain.  Pt. R knee PROM 10-117 today ; increased R knee swelling also.  Pt. to see MD regarding R knee status on april 18th.      PT Next Visit Plan Add exercises to increase strength/ROM and function, manual techniques for increased ROM.        Problem List Patient Active Problem List   Diagnosis Date Noted  . OA (osteoarthritis) of knee 09/10/2015    Bess Harvest, PTA 10/25/2015, 11:55 AM  [facility] [address]  [place], [place], [address] Phone: [phone]   Fax:  [phone]  Name: Seth [name] MRN: [id number] Date of Birth: 10-10-1940

## 2015-10-30 ENCOUNTER — Ambulatory Visit: Payer: Medicare Other | Admitting: Physical Therapy

## 2015-10-30 DIAGNOSIS — M25561 Pain in right knee: Secondary | ICD-10-CM | POA: Diagnosis not present

## 2015-10-30 DIAGNOSIS — M25661 Stiffness of right knee, not elsewhere classified: Secondary | ICD-10-CM

## 2015-10-30 DIAGNOSIS — M7989 Other specified soft tissue disorders: Secondary | ICD-10-CM

## 2015-10-30 DIAGNOSIS — R262 Difficulty in walking, not elsewhere classified: Secondary | ICD-10-CM

## 2015-10-30 NOTE — Therapy (Signed)
[facility] [address]  [place], [place], [address] Phone: [phone]   Fax:  [phone]  Physical Therapy Treatment  Patient Details  Name: Seth [name] MRN: [id number] Date of Birth: Apr 29, 1941 Referring Provider: Wynelle Link  Encounter Date: 10/30/2015      PT End of Session - 10/30/15 1104    Visit Number 6   Number of Visits 24   Date for PT Re-Evaluation 12/07/15   PT Start Time 1100   PT Stop Time 1206   PT Time Calculation (min) 66 min   Activity Tolerance Patient tolerated treatment well   Behavior During Therapy [facility] for tasks assessed/performed      Past Medical History  Diagnosis Date  . Hypertension   . Pneumonia     06/2015   . Arthritis   . Cancer ([place])     hx of prostate cancer     Past Surgical History  Procedure Laterality Date  . Prostatectomy    . 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  Visit Diagnosis:  Knee stiffness, right  Difficulty walking  Swelling of limb  Right knee pain      Subjective Assessment - 10/30/15 1102    Subjective Pt feels like he may have overdone it with his HEP the other day (Sunday) and feels that his knee is stiffer today. Pt states he wants to make today his last day of therapy and start working out at [employer].   Currently in Pain? Yes   Pain Score 4    Pain Location Knee   Pain Orientation Right;Posterior   Pain Descriptors / Indicators Aching            [facility] PT Assessment - 10/30/15 1100    Assessment   Next MD Visit 11/27/15   AROM   Right/Left Knee Right   Right Knee Extension 6   Right Knee Flexion 118   PROM   Right/Left Knee Right   Right Knee Extension 4   Right Knee Flexion 124         Today's treatment  TherEx Rec bike - L3 x  5' R HS, PF, SKTC stretches 2 x 30 sec each Bridge + QS with feet on peanut ball 15x3" Bridging with isometric Hip ABD with black TB x10  Hooklying Alternating Hip ABD/ER Clam shell with black TB x 10 reps  L side lying hip abduction x 15 reps  Manual STM to R hamstring with gentle C/R  Manual R HS stretch 2x30" R Patella mobs Tibiofemoral mobs for extension Manual overpressure extension  TherEx R Gastroc ProStretch 3x30" TRX squat x10, + Heel raises x5 BATCA Knee flexion 25# x15, B concentric/R eccentric 20# x10 BATCA Knee extension 20# x10  Vasopneumatic compression to R knee, medium compression, lowest temp x15'; L LE elevated on bolster          PT Short Term Goals - 10/23/15 1103    PT SHORT TERM GOAL #1   Title independent with the low load long duration stretches  10/23/15: Pt. demo'd I with low load long duration stretches.     Time 2   Period Weeks   Status Achieved           PT Long Term Goals - 10/30/15 1154  PT LONG TERM GOAL #1   Title decrease pain 50% by 12/07/15   Status On-going   PT LONG TERM GOAL #2   Title increase AROM of the right knee to 3-115 degrees flexion by 12/07/15   Status Partially Met  Met for flexion ROM   PT LONG TERM GOAL #3   Title go up and down stairs step over step by 12/07/15   Status On-going   PT LONG TERM GOAL #4   Title decrease edema to within 2 cm of the left leg by 12/07/15   Status On-going   PT LONG TERM GOAL #5   Title increase strength to 4+/5 of the right knee by 12/07/15   Status On-going               Plan - 10/30/15 1156    Clinical Impression Statement Pt wanting to transition to gym program at [facility] but encouraged him to continue PT in addition to starting to work out at the gym to allow continued focus on restoration of full ROM in R knee and good strength/stability in R LE. Focused on R knee extension ROM both with manual therapy and actively during exercises.   PT Next Visit Plan Add  exercises to increase strength/ROM and function, manual techniques for increased extension ROM.   Consulted and Agree with Plan of Care Patient        Problem List Patient Active Problem List   Diagnosis Date Noted  . OA (osteoarthritis) of knee 09/10/2015    Percival Spanish, PT, MPT 10/30/2015, 12:10 PM  [facility] [address]  [place], [place], [address] Phone: [phone]   Fax:  [phone]  Name: MARSTON MCCADDEN MRN: [id number] Date of Birth: 1941-05-08

## 2015-11-01 ENCOUNTER — Ambulatory Visit: Payer: Medicare Other

## 2015-11-01 DIAGNOSIS — M25561 Pain in right knee: Secondary | ICD-10-CM | POA: Diagnosis not present

## 2015-11-01 DIAGNOSIS — R262 Difficulty in walking, not elsewhere classified: Secondary | ICD-10-CM

## 2015-11-01 DIAGNOSIS — M7989 Other specified soft tissue disorders: Secondary | ICD-10-CM

## 2015-11-01 DIAGNOSIS — M25661 Stiffness of right knee, not elsewhere classified: Secondary | ICD-10-CM

## 2015-11-01 NOTE — Therapy (Signed)
[facility] [address]  [place], [place], [address] Phone: [phone]   Fax:  [phone]  Physical Therapy Treatment  Patient Details  Name: ABDULAH IQBAL MRN: [id number] Date of Birth: [birth year] Referring Provider: Wynelle Link  Encounter Date: 11/01/2015      PT End of Session - 11/01/15 1141    Visit Number 7   Number of Visits 24   Date for PT Re-Evaluation 12/07/15   PT Start Time 1107   PT Stop Time 1156   PT Time Calculation (min) 49 min   Activity Tolerance Patient tolerated treatment well   Behavior During Therapy [facility] for tasks assessed/performed      Past Medical History  Diagnosis Date  . Hypertension   . Pneumonia     06/2015   . Arthritis   . Cancer ([place])     hx of prostate cancer     Past Surgical History  Procedure Laterality Date  . Prostatectomy    . 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  Visit Diagnosis:  Difficulty walking  Knee stiffness, right  Swelling of limb  Right knee pain      Subjective Assessment - 11/01/15 1115    Subjective 3/10 R knee pain today initially.  Pt. reports being pretty stiff today.     Patient Stated Goals I want to get back to riding horses   Currently in Pain? Yes   Pain Score 3    Pain Location Knee   Pain Orientation Right;Posterior   Pain Descriptors / Indicators Aching   Pain Type Surgical pain   Pain Radiating Towards n/a   Pain Onset More than a month ago   Pain Frequency Constant   Aggravating Factors  bending the knee    Pain Relieving Factors resting    Multiple Pain Sites No         Today's treatment  TherEx NuStep 4 min, level 5 R HS, SKTC stretches x 30 sec each Bridging with B hip abd/ER with blue TB x 10 reps  Bridging with heels  on peanut p-ball x 10 reps  L side lying clam shell with blue TB x 15 reps R straight leg raise focusing on TKE x 15 reps L sidelying R hip abduction x 10 reps  Manual R Patella mobs (to improve extension)  TherEx R Gastroc ProStretch 2 x 30" TRX squat x 8; terminated due to pt. getting tired / poor technique  BATCA Knee flexion 25# x15 BATCA Knee extension 20# x10 Seated R knee ext. LLLD stretch x 4 min   Vasopneumatic compression to R knee, medium compression, lowest temp x15'; L LE elevated on bolster         PT Short Term Goals - 10/23/15 1103    PT SHORT TERM GOAL #1   Title independent with the low load long duration stretches  10/23/15: Pt. demo'd I with low load long duration stretches.     Time 2   Period Weeks   Status Achieved           PT Long Term Goals - 10/30/15 1154    PT LONG TERM GOAL #1   Title decrease pain 50% by 12/07/15   Status On-going   PT LONG TERM GOAL #2  Title increase AROM of the right knee to 3-115 degrees flexion by 12/07/15   Status Partially Met  Met for flexion ROM   PT LONG TERM GOAL #3   Title go up and down stairs step over step by 12/07/15   Status On-going   PT LONG TERM GOAL #4   Title decrease edema to within 2 cm of the left leg by 12/07/15   Status On-going   PT LONG TERM GOAL #5   Title increase strength to 4+/5 of the right knee by 12/07/15   Status On-going               Plan - 11/01/15 1158    Clinical Impression Statement Pt. reported 3/10 R knee pain initially.  Pt. continues to demo good R knee flexion ROM and continues to lack some extension however R LE with improved strength today tolerating increased repetitions of all therex.  Pt. continues to progress toward goals.     PT Next Visit Plan Add exercises to increase strength/ROM and function, manual techniques for increased extension ROM.        Problem List Patient Active Problem List   Diagnosis Date Noted  . OA (osteoarthritis) of knee  09/10/2015    Bess Harvest, PTA 11/01/2015, 12:03 PM  [facility] [address]  [place], [place], [address] Phone: [phone]   Fax:  [phone]  Name: DIRCK BUTCH MRN: [id number] Date of Birth: May 15, 1941

## 2015-11-06 ENCOUNTER — Ambulatory Visit: Payer: Medicare Other

## 2015-11-06 DIAGNOSIS — R262 Difficulty in walking, not elsewhere classified: Secondary | ICD-10-CM

## 2015-11-06 DIAGNOSIS — M7989 Other specified soft tissue disorders: Secondary | ICD-10-CM

## 2015-11-06 DIAGNOSIS — M25661 Stiffness of right knee, not elsewhere classified: Secondary | ICD-10-CM

## 2015-11-06 DIAGNOSIS — M25561 Pain in right knee: Secondary | ICD-10-CM

## 2015-11-06 NOTE — Therapy (Signed)
[facility] [address]  [place], [place], [address] Phone: [phone]   Fax:  [phone]  Physical Therapy Treatment  Patient Details  Name: Seth [name] MRN: [id number] Date of Birth: 1941-05-07 Referring Provider: Wynelle Link  Encounter Date: 11/06/2015      PT End of Session - 11/06/15 1151    Visit Number 8   Number of Visits 24   Date for PT Re-Evaluation 12/07/15   PT Start Time 1105   PT Stop Time 1157   PT Time Calculation (min) 52 min   Activity Tolerance Patient tolerated treatment well   Behavior During Therapy [facility] for tasks assessed/performed      Past Medical History  Diagnosis Date  . Hypertension   . Pneumonia     06/2015   . Arthritis   . Cancer ([place])     hx of prostate cancer     Past Surgical History  Procedure Laterality Date  . Prostatectomy    . 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  Visit Diagnosis:  Knee stiffness, right  Difficulty walking  Swelling of limb  Right knee pain      Subjective Assessment - 11/06/15 1114    Subjective Pt. reports a 4/10 R knee pain at rest.  Pt. reports skipping HEP the last few days.     Patient Stated Goals I want to get back to riding horses   Currently in Pain? Yes   Pain Score 4    Pain Location Knee   Pain Orientation Right;Posterior   Pain Descriptors / Indicators Aching   Pain Type Surgical pain   Pain Radiating Towards n/a   Pain Onset More than a month ago   Pain Frequency Constant   Aggravating Factors  bending the knee    Pain Relieving Factors resting    Multiple Pain Sites No      Today's treatment  TherEx NuStep 4 min, level 4 R HS, SKTC stretches x 30 sec each Bridging with B hip abd/ER with black TB x 10 reps  Bridging x  10 reps  SL bridging   Manual R Patella mobs (to improve extension) R knee flexion / ext mobs   TherEx R Gastroc ProStretch 2 x 30" 4" R step up with black TB pull at knee x 10 reps  Standing TKE with black TB in door x 15 reps   Ice pack to posterior and anterior R knee in supine with R LE elevated on bolster x 10 min          PT Short Term Goals - 10/23/15 1103    PT SHORT TERM GOAL #1   Title independent with the low load long duration stretches  10/23/15: Pt. demo'd I with low load long duration stretches.     Time 2   Period Weeks   Status Achieved           PT Long Term Goals - 10/30/15 1154    PT LONG TERM GOAL #1   Title decrease pain 50% by 12/07/15   Status On-going   PT LONG TERM GOAL #2   Title increase AROM of the right knee to 3-115 degrees flexion by 12/07/15   Status Partially Met  Met for flexion ROM   PT  LONG TERM GOAL #3   Title go up and down stairs step over step by 12/07/15   Status On-going   PT LONG TERM GOAL #4   Title decrease edema to within 2 cm of the left leg by 12/07/15   Status On-going   PT LONG TERM GOAL #5   Title increase strength to 4+/5 of the right knee by 12/07/15   Status On-going               Plan - 11/06/15 1151    Clinical Impression Statement pt. tolerated all B hip / knee strengthening and stretching therex well today with only mild R knee pain increase in end range flexion.  Pt. ROM continues to improve; AROM 8-118 dg, PROM 6-124 dg.  4" step up with black TB resistance added today for R quad strengthening; pt. tolerated well with only minimal fatigue demo'd.     PT Next Visit Plan Add exercises to increase strength/ROM and function, manual techniques for increased extension ROM.        Problem List Patient Active Problem List   Diagnosis Date Noted  . OA (osteoarthritis) of knee 09/10/2015    Bess Harvest, PTA 11/06/2015, 11:55 AM  [facility] [address]  [place], [place], [address] Phone: [phone]   Fax:  [phone]  Name: Seth [name] MRN: [id number] Date of Birth: September 04, 1940

## 2015-11-08 ENCOUNTER — Ambulatory Visit: Payer: Medicare Other

## 2015-11-08 DIAGNOSIS — M25561 Pain in right knee: Secondary | ICD-10-CM

## 2015-11-08 DIAGNOSIS — M7989 Other specified soft tissue disorders: Secondary | ICD-10-CM

## 2015-11-08 DIAGNOSIS — R262 Difficulty in walking, not elsewhere classified: Secondary | ICD-10-CM

## 2015-11-08 DIAGNOSIS — M25661 Stiffness of right knee, not elsewhere classified: Secondary | ICD-10-CM

## 2015-11-08 NOTE — Therapy (Addendum)
[facility] [address]  [place], [place], [address] Phone: [phone]   Fax:  [phone]  Physical Therapy Treatment  Patient Details  Name: Seth [name] MRN: [id number] Date of Birth: [birth year] Referring Provider: Wynelle Link  Encounter Date: 11/08/2015      PT End of Session - 11/08/15 1442    Visit Number 9   Number of Visits 24   Date for PT Re-Evaluation 12/07/15   PT Start Time 5427   PT Stop Time 1442   PT Time Calculation (min) 39 min   Activity Tolerance Patient tolerated treatment well   Behavior During Therapy [facility] for tasks assessed/performed      Past Medical History  Diagnosis Date  . Hypertension   . Pneumonia     06/2015   . Arthritis   . Cancer ([place])     hx of prostate cancer     Past Surgical History  Procedure Laterality Date  . Prostatectomy    . 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  Visit Diagnosis:  Knee stiffness, right  Difficulty walking  Swelling of limb  Right knee pain      Subjective Assessment - 11/08/15 1410    Subjective Pt. reports a 5/10 R knee pain at rest.  Pt. reports moving furniture yesterday with a friend and the knee hurting since then.     Currently in Pain? Yes   Pain Score 5    Pain Location Knee   Pain Orientation Right;Posterior   Pain Descriptors / Indicators Aching   Pain Type Surgical pain   Pain Radiating Towards n/a   Pain Onset More than a month ago   Pain Frequency Constant   Aggravating Factors  bending the knee    Pain Relieving Factors resting   Multiple Pain Sites No      Today's treatment * pt. with increased R knee swelling and pain today following moving furniture.  Therex not advanced today b/c of this.   TherEx NuStep 4 min, level  3 R HS, SKTC stretches x 30 sec each Hooklying B hip abd/ER with blue TB x 15 reps Bridging 2 x 10 reps  Straight leg bridging with heels on peanut p-ball x 10 reps  SL bridging x 10 on each leg   Manual R Patella mobs (to improve extension) R knee flexion / ext mobs   TherEx R Gastroc ProStretch 2 x 30" 4" R step up with black TB pull at knee x 3 reps; activity terminated secondary to pt. Pain.  Standing TKE with black TB in door x 15 reps   Vasoneumatic device: medium compression, coldest temp., supine, R LE elevated. 15 min         PT Short Term Goals - 10/23/15 1103    PT SHORT TERM GOAL #1   Title independent with the low load long duration stretches  10/23/15: Pt. demo'd I with low load long duration stretches.     Time 2   Period Weeks   Status Achieved           PT Long Term Goals - 10/30/15 1154    PT LONG TERM GOAL #1   Title decrease pain 50% by 12/07/15   Status On-going   PT LONG TERM GOAL #2  Title increase AROM of the right knee to 3-115 degrees flexion by 12/07/15   Status Partially Met  Met for flexion ROM   PT LONG TERM GOAL #3   Title go up and down stairs step over step by 12/07/15   Status On-going   PT LONG TERM GOAL #4   Title decrease edema to within 2 cm of the left leg by 12/07/15   Status On-going   PT LONG TERM GOAL #5   Title increase strength to 4+/5 of the right knee by 12/07/15   Status On-going               Plan - 09-Nov-2015 1445    Clinical Impression Statement Pt. reports increased R knee pain with swelling visibly increased initially today; pt. reports helping friend move furniture yesterday for a few hours; pt. instructed to avoid over working R knee in this manner at this point in rehabilitation.  Today's therex performed at lower intensity secondary to increased R knee pain and swelling; pt. tolerated all hip knee strengthening well.     PT Next Visit Plan Add exercises to increase strength/ROM and function, manual  techniques for increased extension ROM.        Problem List Patient Active Problem List   Diagnosis Date Noted  . OA (osteoarthritis) of knee 09/10/2015    Bess Harvest, PTA November 09, 2015, 5:46 PM  [facility] [address]  [place], [place], [address] Phone: [phone]   Fax:  [phone]  Name: Seth [name] MRN: [id number] Date of Birth: 06-14-1941   PHYSICAL THERAPY DISCHARGE SUMMARY  Visits from Start of Care: 9  Current functional level related to goals / functional outcomes:   Unable to formally assess status at discharge secondary pt requesting to be discharged when contacted to schedule further PT appointments. Most recent ROM measurements for R knee were AROM 8-118 & PROM 6-124, partially meeting ROM LTG for flexion ROM. Remaining goals not assessed due to unplanned D/C.   Remaining deficits:   Unable to assess due to failure to return to PT.   Education / Equipment:   HEP   G-Codes - Nov 09, 2015 1442    Functional Assessment Tool Used foto 58% limitation(unable to reassess secondary to failure to return to PT)   Functional Limitation Mobility: Walking and moving around   Mobility: Walking and Moving Around Goal Status [phone]) At least 20 percent but less than 40 percent impaired, limited or restricted   Mobility: Walking and Moving Around Discharge Status [phone]) At least 40 percent but less than 60 percent impaired, limited or restricted           Plan: Patient agrees to discharge.  Patient goals were partially met. Patient is being discharged due to the patient's request.  ?????       Percival Spanish, PT, MPT 11/26/2015, 2:00 PM  [facility] [address]  [place], [place], [address] Phone: [phone]   Fax:  [phone]

## 2015-11-12 ENCOUNTER — Ambulatory Visit: Payer: Medicare Other | Admitting: Rehabilitation

## 2015-11-13 ENCOUNTER — Ambulatory Visit: Payer: Medicare Other

## 2015-11-15 ENCOUNTER — Ambulatory Visit: Payer: Medicare Other | Admitting: Physical Therapy

## 2015-12-10 ENCOUNTER — Emergency Department (HOSPITAL_BASED_OUTPATIENT_CLINIC_OR_DEPARTMENT_OTHER)
Admission: EM | Admit: 2015-12-10 | Discharge: 2015-12-10 | Disposition: A | Discharge status: Home / Self Care | Payer: Medicare Other | Attending: Emergency Medicine | Admitting: Emergency Medicine

## 2015-12-10 ENCOUNTER — Encounter (HOSPITAL_BASED_OUTPATIENT_CLINIC_OR_DEPARTMENT_OTHER): Payer: Self-pay | Admitting: *Deleted

## 2015-12-10 DIAGNOSIS — L309 Dermatitis, unspecified: Secondary | ICD-10-CM | POA: Insufficient documentation

## 2015-12-10 DIAGNOSIS — Z87891 Personal history of nicotine dependence: Secondary | ICD-10-CM | POA: Diagnosis not present

## 2015-12-10 DIAGNOSIS — M1990 Unspecified osteoarthritis, unspecified site: Secondary | ICD-10-CM | POA: Diagnosis not present

## 2015-12-10 DIAGNOSIS — I10 Essential (primary) hypertension: Secondary | ICD-10-CM | POA: Insufficient documentation

## 2015-12-10 DIAGNOSIS — Z8546 Personal history of malignant neoplasm of prostate: Secondary | ICD-10-CM | POA: Diagnosis not present

## 2015-12-10 DIAGNOSIS — R21 Rash and other nonspecific skin eruption: Secondary | ICD-10-CM | POA: Diagnosis present

## 2015-12-10 MED ORDER — FLUOCINONIDE 0.05 % EX OINT: 1.0000 "application " | TOPICAL_OINTMENT | Freq: Two times a day (BID) | CUTANEOUS | Status: DC

## 2015-12-10 MED ORDER — BETAMETHASONE DIPROPIONATE AUG 0.05 % EX OINT: TOPICAL_OINTMENT | Freq: Two times a day (BID) | CUTANEOUS | Status: DC

## 2015-12-10 NOTE — Discharge Instructions (Signed)
Eczema Eczema, also called atopic dermatitis, is a skin disorder that causes inflammation of the skin. It causes a red rash and dry, scaly skin. The skin becomes very itchy. Eczema is generally worse during the cooler winter months and often improves with the warmth of summer. Eczema usually starts showing signs in infancy. Some children outgrow eczema, but it may last through adulthood.  CAUSES  The exact cause of eczema is not known, but it appears to run in families. People with eczema often have a family history of eczema, allergies, asthma, or hay fever. Eczema is not contagious. Flare-ups of the condition may be caused by:   Contact with something you are sensitive or allergic to.   Stress. SIGNS AND SYMPTOMS  Dry, scaly skin.   Red, itchy rash.   Itchiness. This may occur before the skin rash and may be very intense.  DIAGNOSIS  The diagnosis of eczema is usually made based on symptoms and medical history. TREATMENT  Eczema cannot be cured, but symptoms usually can be controlled with treatment and other strategies. A treatment plan might include:  Controlling the itching and scratching.   Use over-the-counter antihistamines as directed for itching. This is especially useful at night when the itching tends to be worse.   Use over-the-counter steroid creams as directed for itching.   Avoid scratching. Scratching makes the rash and itching worse. It may also result in a skin infection (impetigo) due to a break in the skin caused by scratching.   Keeping the skin well moisturized with creams every day. This will seal in moisture and help prevent dryness. Lotions that contain alcohol and water should be avoided because they can dry the skin.   Limiting exposure to things that you are sensitive or allergic to (allergens).   Recognizing situations that cause stress.   Developing a plan to manage stress.  HOME CARE INSTRUCTIONS   Only take over-the-counter or  prescription medicines as directed by your health care provider.   Do not use anything on the skin without checking with your health care provider.   Keep baths or showers short (5 minutes) in warm (not hot) water. Use mild cleansers for bathing. These should be unscented. You may add nonperfumed bath oil to the bath water. It is best to avoid soap and bubble bath.   Immediately after a bath or shower, when the skin is still damp, apply a moisturizing ointment to the entire body. This ointment should be a petroleum ointment. This will seal in moisture and help prevent dryness. The thicker the ointment, the better. These should be unscented.   Keep fingernails cut short. Children with eczema may need to wear soft gloves or mittens at night after applying an ointment.   Dress in clothes made of cotton or cotton blends. Dress lightly, because heat increases itching.   A child with eczema should stay away from anyone with fever blisters or cold sores. The virus that causes fever blisters (herpes simplex) can cause a serious skin infection in children with eczema. SEEK MEDICAL CARE IF:   Your itching interferes with sleep.   Your rash gets worse or is not better within 1 week after starting treatment.   You see pus or soft yellow scabs in the rash area.   You have a fever.   You have a rash flare-up after contact with someone who has fever blisters.    This information is not intended to replace advice given to you by your health care  provider. Make sure you discuss any questions you have with your health care provider.   Document Released: 07/25/2000 Document Revised: 05/18/2013 Document Reviewed: 02/28/2013 Elsevier Interactive Patient Education 2016 [employer].  Hydrocortisone skin cream, ointment, lotion, or solution What is this medicine? HYDROCORTISONE (hye droe KOR ti sone) is a corticosteroid. It is used on the skin to reduce swelling, redness, itching, and allergic  reactions. This medicine may be used for other purposes; ask your health care provider or pharmacist if you have questions. What should I tell my health care provider before I take this medicine? They need to know if you have any of these conditions: -any active infection -diabetes -large areas of burned or damaged skin -skin wasting or thinning -an unusual or allergic reaction to hydrocortisone, corticosteroids, sulfites, other medicines, foods, dyes, or preservatives -pregnant or trying to get pregnant -breast-feeding How should I use this medicine? This medicine is for external use only. Do not take by mouth. Follow the directions on the prescription label. Wash your hands before and after use. Apply a thin film of medicine to the affected area. Do not cover with a bandage or dressing unless your doctor or health care professional tells you to. Do not use on healthy skin or over large areas of skin. Do not get this medicine in your eyes. If you do, rinse out with plenty of cool tap water. Do not to use more medicine than prescribed. Do not use your medicine more often than directed or for more than 14 days. Talk to your pediatrician regarding the use of this medicine in children. Special care may be needed. While this drug may be prescribed for children as young as 66 years of age for selected conditions, precautions do apply. Do not use this medicine for the treatment of diaper rash unless directed to do so by your doctor or health care professional. If applying this medicine to the diaper area of a child, do not cover with tight-fitting diapers or plastic pants. This may increase the amount of medicine that passes through the skin and increase the risk of serious side effects. Elderly patients are more likely to have damaged skin through aging, and this may increase side effects. This medicine should only be used for brief periods and infrequently in older patients. Overdosage: If you think you  have taken too much of this medicine contact a poison control center or emergency room at once. NOTE: This medicine is only for you. Do not share this medicine with others. What if I miss a dose? If you miss a dose, use it as soon as you can. If it is almost time for your next dose, use only that dose. Do not use double or extra doses. What may interact with this medicine? Interactions are not expected. Do not use any other skin products on the affected area without asking your doctor or health care professional. This list may not describe all possible interactions. Give your health care provider a list of all the medicines, herbs, non-prescription drugs, or dietary supplements you use. Also tell them if you smoke, drink alcohol, or use illegal drugs. Some items may interact with your medicine. What should I watch for while using this medicine? Tell your doctor or health care professional if your symptoms do not start to get better within 7 days or if they get worse. Tell your doctor or health care professional if you are exposed to anyone with measles or chickenpox, or if you develop sores or  blisters that do not heal properly. What side effects may I notice from receiving this medicine? Side effects that you should report to your doctor or health care professional as soon as possible: -allergic reactions like skin rash, itching or hives, swelling of the face, lips, or tongue -burning feeling on the skin -dark red spots on the skin -infection -lack of healing of skin condition -painful, red, pus filled blisters in hair follicles -thinning of the skin Side effects that usually do not require medical attention (report to your doctor or health care professional if they continue or are bothersome): -dry skin, irritation -unusual increased growth of hair on the face or body This list may not describe all possible side effects. Call your doctor for medical advice about side effects. You may report  side effects to FDA at 1-800-FDA-1088. Where should I keep my medicine? Keep out of the reach of children. Store at room temperature between 15 and 30 degrees C (59 and 86 degrees F). Do not freeze. Throw away any unused medicine after the expiration date. NOTE: This sheet is a summary. It may not cover all possible information. If you have questions about this medicine, talk to your doctor, pharmacist, or health care provider.    2016, Elsevier/[employer] Standard. (2007-12-10 16:08:48)

## 2015-12-10 NOTE — ED Provider Notes (Signed)
CSN: [id number]     Arrival date & time 12/10/15  [id number] History   First MD Initiated Contact with Patient 12/10/15 0902     No chief complaint on file.    (Consider location/radiation/quality/duration/timing/severity/associated sxs/prior Treatment) HPI   75 year old male with history of prostate cancer, hypertension, arthritis presenting with complaint of itching from eczema. Patient states he has a history of eczema that he's been doing for years. His primary care provider usually prescribed deploying ointment to use as needed when he has a flareup. He has been out of his ointment for 2 weeks and his doctor offices in the process of closing and he cannot get a refill. Patient is about to go out of town and requesting to get a refill of his medication. Otherwise patient denies having any fever, change in soap detergent new medication or having new pets. He denies having any throat swelling, chest pain, shortness of breath, abdominal cramping. His eczema is primarily to his forearms, lower leg and back.  Past Medical History  Diagnosis Date  . Hypertension   . Pneumonia     06/2015   . Arthritis   . Cancer ([place])     hx of prostate cancer    Past Surgical History  Procedure Laterality Date  . Prostatectomy    . 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 Social History  Substance Use Topics  . Smoking status: Former Research scientist (life sciences)  . Smokeless tobacco: Never Used  . Alcohol Use: Yes     Comment: occasionally     Review of Systems  Constitutional: Negative for fever.  Skin: Positive for rash.  Neurological: Negative for numbness.      Allergies  Review of patient's allergies indicates no known allergies.  Home Medications   Prior to Admission medications   Medication Sig Start Date End Date Taking? Authorizing Provider   albuterol (PROVENTIL HFA;VENTOLIN HFA) 108 (90 Base) MCG/ACT inhaler Inhale 1-2 puffs into the lungs every 6 (six) hours as needed. Shortness of breathe. 07/04/15 07/03/16  Historical Provider, MD  amLODipine (NORVASC) 10 MG tablet Take 10 mg by mouth every morning. 05/14/14   Historical Provider, MD  atorvastatin (LIPITOR) 40 MG tablet Take 40 mg by mouth every morning.  08/17/15   Historical Provider, MD  cyclobenzaprine (FLEXERIL) 10 MG tablet Take 1 tablet (10 mg total) by mouth 3 (three) times daily as needed for muscle spasms. 09/12/15   Arlee Muslim, PA-C  HYDROmorphone (DILAUDID) 2 MG tablet Take 1 tablet (2 mg total) by mouth every 3 (three) hours as needed for severe pain. 09/12/15   Arlee Muslim, PA-C  quinapril (ACCUPRIL) 40 MG tablet Take 40 mg by mouth every morning.  08/17/15   Historical Provider, MD  rivaroxaban (XARELTO) 10 MG TABS tablet Take 1 tablet (10 mg total) by mouth daily with breakfast. Take Xarelto for two and a half more weeks, then discontinue Xarelto. Once the patient has completed the blood thinner regimen, then take a Baby 81 mg Aspirin daily for three more weeks. 09/12/15   Arlee Muslim, PA-C  traMADol (ULTRAM) 50 MG tablet Take 1-2 tablets (50-100 mg total) by mouth every 6 (six) hours as needed (mild to moderate pain). 09/12/15   Arlee Muslim, PA-C  TRAVATAN Z 0.004 % SOLN ophthalmic solution Place 1 drop into both eyes at bedtime. 07/16/15  Historical Provider, MD   BP 136/85 mmHg  Pulse 94  Temp(Src) 98 F (36.7 C) (Oral)  Resp 18  Ht 5\' 11"  (1.803 m)  Wt 104.327 kg  BMI 32.09 kg/m2  SpO2 100% Physical Exam  Constitutional: He appears well-developed and well-nourished. No distress.  HENT:  Head: Atraumatic.  Eyes: Conjunctivae are normal.  Neck: Neck supple.  Neurological: He is alert.  Skin: Rash (Hyperpigmented excoriated rash noted to bilateral dorsum of forearm, anterior lower legs and lower back is non-infected and is consistence with eczema.) noted.   Psychiatric: He has a normal mood and affect.  Nursing note and vitals reviewed.   ED Course  Procedures (including critical care time) Labs Review Labs Reviewed - No data to display  Imaging Review No results found. I have personally reviewed and evaluated these images and lab results as part of my medical decision-making.   EKG Interpretation None      MDM   Final diagnoses:  Eczema    BP 136/85 mmHg  Pulse 94  Temp(Src) 98 F (36.7 C) (Oral)  Resp 18  Ht 5\' 11"  (1.803 m)  Wt 104.327 kg  BMI 32.09 kg/m2  SpO2 100%   9:14 AM Pt with hx of eczema, here requesting refill of his eczema ointment, diprolene.  No concerning rash identified.  Normal eczematic rash.  Ointment prescribed.   Domenic Moras, PA-C 12/10/15 0915  Alfonzo Beers, MD 12/10/15 [phone]

## 2015-12-10 NOTE — ED Notes (Signed)
Patient c/o rash to arms and legs,he has had this issue in the past

## 2017-11-08 ENCOUNTER — Other Ambulatory Visit: Payer: Self-pay

## 2017-11-08 ENCOUNTER — Emergency Department (HOSPITAL_BASED_OUTPATIENT_CLINIC_OR_DEPARTMENT_OTHER)
Admission: EM | Admit: 2017-11-08 | Discharge: 2017-11-08 | Disposition: A | Discharge status: Home / Self Care | Payer: Medicare Other | Attending: Emergency Medicine | Admitting: Emergency Medicine

## 2017-11-08 ENCOUNTER — Encounter (HOSPITAL_BASED_OUTPATIENT_CLINIC_OR_DEPARTMENT_OTHER): Payer: Self-pay | Admitting: Emergency Medicine

## 2017-11-08 ENCOUNTER — Emergency Department (HOSPITAL_BASED_OUTPATIENT_CLINIC_OR_DEPARTMENT_OTHER): Payer: Medicare Other

## 2017-11-08 DIAGNOSIS — R05 Cough: Secondary | ICD-10-CM | POA: Diagnosis not present

## 2017-11-08 DIAGNOSIS — R059 Cough, unspecified: Secondary | ICD-10-CM

## 2017-11-08 DIAGNOSIS — Z8546 Personal history of malignant neoplasm of prostate: Secondary | ICD-10-CM | POA: Diagnosis not present

## 2017-11-08 DIAGNOSIS — R062 Wheezing: Secondary | ICD-10-CM | POA: Diagnosis not present

## 2017-11-08 DIAGNOSIS — Z7901 Long term (current) use of anticoagulants: Secondary | ICD-10-CM | POA: Diagnosis not present

## 2017-11-08 DIAGNOSIS — Z96651 Presence of right artificial knee joint: Secondary | ICD-10-CM | POA: Insufficient documentation

## 2017-11-08 DIAGNOSIS — I10 Essential (primary) hypertension: Secondary | ICD-10-CM | POA: Insufficient documentation

## 2017-11-08 DIAGNOSIS — R0602 Shortness of breath: Secondary | ICD-10-CM

## 2017-11-08 DIAGNOSIS — Z87891 Personal history of nicotine dependence: Secondary | ICD-10-CM | POA: Insufficient documentation

## 2017-11-08 DIAGNOSIS — Z79899 Other long term (current) drug therapy: Secondary | ICD-10-CM | POA: Insufficient documentation

## 2017-11-08 DIAGNOSIS — J069 Acute upper respiratory infection, unspecified: Secondary | ICD-10-CM

## 2017-11-08 DIAGNOSIS — J3489 Other specified disorders of nose and nasal sinuses: Secondary | ICD-10-CM | POA: Insufficient documentation

## 2017-11-08 LAB — CBC WITH DIFFERENTIAL/PLATELET
Basophils Absolute: 0 10*3/uL (ref 0.0–0.1)
Basophils Relative: 1 %
Eosinophils Absolute: 0.3 10*3/uL (ref 0.0–0.7)
Eosinophils Relative: 8 %
HEMATOCRIT: 41.7 % (ref 39.0–52.0)
HEMOGLOBIN: 14 g/dL (ref 13.0–17.0)
LYMPHS ABS: 1.2 10*3/uL (ref 0.7–4.0)
LYMPHS PCT: 34 %
MCH: 28.8 pg (ref 26.0–34.0)
MCHC: 33.6 g/dL (ref 30.0–36.0)
MCV: 85.8 fL (ref 78.0–100.0)
MONOS PCT: 14 %
Monocytes Absolute: 0.5 10*3/uL (ref 0.1–1.0)
NEUTROS ABS: 1.6 10*3/uL — AB (ref 1.7–7.7)
NEUTROS PCT: 43 %
Platelets: 188 10*3/uL (ref 150–400)
RBC: 4.86 MIL/uL (ref 4.22–5.81)
RDW: 14.6 % (ref 11.5–15.5)
WBC: 3.6 10*3/uL — ABNORMAL LOW (ref 4.0–10.5)

## 2017-11-08 LAB — BASIC METABOLIC PANEL
Anion gap: 7 (ref 5–15)
BUN: 11 mg/dL (ref 6–20)
CHLORIDE: 110 mmol/L (ref 101–111)
CO2: 21 mmol/L — AB (ref 22–32)
CREATININE: 1.1 mg/dL (ref 0.61–1.24)
Calcium: 8.5 mg/dL — ABNORMAL LOW (ref 8.9–10.3)
GFR calc Af Amer: >60 mL/min (ref >60)
GFR calc non Af Amer: >60 mL/min (ref >60)
Glucose, Bld: 108 mg/dL — ABNORMAL HIGH (ref 65–99)
Potassium: 3.7 mmol/L (ref 3.5–5.1)
Sodium: 138 mmol/L (ref 135–145)

## 2017-11-08 LAB — BRAIN NATRIURETIC PEPTIDE: B NATRIURETIC PEPTIDE 5: 25.8 pg/mL (ref 0.0–100.0)

## 2017-11-08 LAB — TROPONIN I: Troponin I: <0.03 ng/mL (ref <0.03)

## 2017-11-08 MED ORDER — ALBUTEROL SULFATE HFA 108 (90 BASE) MCG/ACT IN AERS: 1.0000 | INHALATION_SPRAY | Freq: Four times a day (QID) | RESPIRATORY_TRACT | 0 refills | Status: AC | PRN

## 2017-11-08 MED ORDER — IPRATROPIUM-ALBUTEROL 0.5-2.5 (3) MG/3ML IN SOLN
3.0000 mL | Freq: Four times a day (QID) | RESPIRATORY_TRACT | Status: DC
  Administered 2017-11-08: 3 mL via RESPIRATORY_TRACT
  Filled 2017-11-08: qty 3

## 2017-11-08 NOTE — ED Triage Notes (Signed)
Cough and SOB x 2 weeks.

## 2017-11-08 NOTE — ED Provider Notes (Signed)
[facility] EMERGENCY DEPARTMENT Provider Note   CSN: [id number] Arrival date & time: 11/08/17  3532     History   Chief Complaint Chief Complaint  Patient presents with  . Shortness of Breath    HPI Seth [name] is a 77 y.o. male.  The history is provided by the patient and medical records.  Cough  This is a new problem. The current episode started more than 1 week ago. The problem occurs constantly. The problem has not changed since onset.The cough is non-productive. There has been no fever. Associated symptoms include rhinorrhea, shortness of breath and wheezing. Pertinent negatives include no chest pain, no chills, no sweats, no ear congestion and no headaches. He has tried nothing for the symptoms. The treatment provided no relief.    Past Medical History:  Diagnosis Date  . Arthritis   . Cancer ([facility])    hx of prostate cancer   . Hypertension   . Pneumonia    06/2015     Patient Active Problem List   Diagnosis Date Noted  . OA (osteoarthritis) of knee 09/10/2015    Past Surgical History:  Procedure Laterality Date  . PROSTATECTOMY    . 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   Procedure: RIGHT TOTAL KNEE ARTHROPLASTY;  Surgeon: Gaynelle Arabian, MD;  Location: [facility];  Service: Orthopedics;  Laterality: Right;        Home Medications    Prior to Admission medications   Medication Sig Start Date End Date Taking? Authorizing Provider  amLODipine (NORVASC) 10 MG tablet Take 10 mg by mouth every morning. 05/14/14   [provider]  atorvastatin (LIPITOR) 40 MG tablet Take 40 mg by mouth every morning.  08/17/15   [provider]  augmented betamethasone dipropionate (DIPROLENE) 0.05 % ointment Apply topically 2 (two) times daily. 12/10/15   Domenic Moras, PA-C  cyclobenzaprine (FLEXERIL) 10 MG tablet Take 1 tablet (10 mg total) by mouth 3 (three) times daily as needed for muscle spasms. 09/12/15   Perkins, Alexzandrew L, PA-C  fluocinonide  ointment (LIDEX) 9.92 % Apply 1 application topically 2 (two) times daily. 12/10/15   Domenic Moras, PA-C  HYDROmorphone (DILAUDID) 2 MG tablet Take 1 tablet (2 mg total) by mouth every 3 (three) hours as needed for severe pain. 09/12/15   Perkins, Alexzandrew L, PA-C  quinapril (ACCUPRIL) 40 MG tablet Take 40 mg by mouth every morning.  08/17/15   [provider]  rivaroxaban (XARELTO) 10 MG TABS tablet Take 1 tablet (10 mg total) by mouth daily with breakfast. Take Xarelto for two and a half more weeks, then discontinue Xarelto. Once the patient has completed the blood thinner regimen, then take a Baby 81 mg Aspirin daily for three more weeks. 09/12/15   Perkins, Alexzandrew L, PA-C  traMADol (ULTRAM) 50 MG tablet Take 1-2 tablets (50-100 mg total) by mouth every 6 (six) hours as needed (mild to moderate pain). 09/12/15   Perkins, Alexzandrew L, PA-C  TRAVATAN Z 0.004 % SOLN ophthalmic solution Place 1 drop into both eyes at bedtime. 07/16/15   [provider]    Family History No family history on file.  Social History Social History   Tobacco Use  . Smoking status: Former Research scientist (life sciences)  . Smokeless tobacco: Never Used  Substance Use Topics  . Alcohol use: Yes    Comment: occasionally   . Drug use: No     Allergies   Patient has no known allergies.   Review of Systems  Review of Systems  Constitutional: Negative for chills, diaphoresis, fatigue and fever.  HENT: Positive for congestion and rhinorrhea.   Eyes: Negative for visual disturbance.  Respiratory: Positive for cough, shortness of breath and wheezing. Negative for choking, chest tightness and stridor.   Cardiovascular: Negative for chest pain, palpitations and leg swelling.  Gastrointestinal: Negative for constipation, diarrhea, nausea and vomiting.  Genitourinary: Negative for enuresis and flank pain.  Musculoskeletal: Negative for back pain, neck pain and neck stiffness.  Skin: Negative for rash and wound.    Neurological: Negative for light-headedness, numbness and headaches.  Psychiatric/Behavioral: Negative for agitation.  All other systems reviewed and are negative.    Physical Exam Updated Vital Signs BP 123/76 (BP Location: Right Arm)   Pulse 95   Temp 98.6 F (37 C) (Oral)   Resp (!) 24   Ht 5' 11.5" (1.816 m)   Wt 113.9 kg (251 lb)   SpO2 96%   BMI 34.52 kg/m   Physical Exam  Constitutional: He is oriented to person, place, and time. He appears well-developed and well-nourished. No distress.  HENT:  Head: Normocephalic.  Nose: Nose normal.  Mouth/Throat: Oropharynx is clear and moist. No oropharyngeal exudate.  Eyes: Pupils are equal, round, and reactive to light. Conjunctivae and EOM are normal.  Neck: Normal range of motion.  Cardiovascular: Normal rate and intact distal pulses.  No murmur heard. Pulmonary/Chest: Effort normal. No respiratory distress. He has wheezes. He has no rhonchi. He exhibits no tenderness.  Abdominal: Soft. Bowel sounds are normal. He exhibits no distension. There is no tenderness.  Musculoskeletal: Normal range of motion. He exhibits no tenderness.  Neurological: He is alert and oriented to person, place, and time. No cranial nerve deficit or sensory deficit. He exhibits normal muscle tone.  Skin: Skin is warm. Capillary refill takes less than 2 seconds. He is not diaphoretic. No erythema. No pallor.  Nursing note and vitals reviewed.    ED Treatments / Results  Labs (all labs ordered are listed, but only abnormal results are displayed) Labs Reviewed  CBC WITH DIFFERENTIAL/PLATELET - Abnormal; Notable for the following components:      Result Value   WBC 3.6 (*)    Neutro Abs 1.6 (*)    All other components within normal limits  BASIC METABOLIC PANEL - Abnormal; Notable for the following components:   CO2 21 (*)    Glucose, Bld 108 (*)    Calcium 8.5 (*)    All other components within normal limits  TROPONIN I  BRAIN NATRIURETIC  PEPTIDE    EKG EKG Interpretation  Date/Time:  Sunday November 08 2017 10:17:16 EDT Ventricular Rate:  87 PR Interval:    QRS Duration: 84 QT Interval:  372 QTC Calculation: 448 R Axis:   32 Text Interpretation:  Sinus rhythm Low voltage, precordial leads When comapred to prior, slower rate.  No STEMI Confirmed by Antony Blackbird [phone]) on 11/08/2017 10:27:29 AM   Radiology Dg Chest 2 View  Result Date: 11/08/2017 CLINICAL DATA:  Cough and congestion for 2 weeks. EXAM: CHEST - 2 VIEW COMPARISON:  09/03/2015 and prior chest radiograph FINDINGS: The cardiomediastinal silhouette is unremarkable. Mild chronic peribronchial thickening and elevated LEFT hemidiaphragm again noted. There is no evidence of focal airspace disease, pulmonary edema, suspicious pulmonary nodule/mass, pleural effusion, or pneumothorax. No acute bony abnormalities are identified. IMPRESSION: No active cardiopulmonary disease. Electronically Signed   By: Margarette Canada M.D.   On: 11/08/2017 10:37    Procedures Procedures (including  critical care time)  Medications Ordered in ED Medications  ipratropium-albuterol (DUONEB) 0.5-2.5 (3) MG/3ML nebulizer solution 3 mL (3 mLs Nebulization Given 11/08/17 1057)     Initial Impression / Assessment and Plan / ED Course  I have reviewed the triage vital signs and the nursing notes.  Pertinent labs & imaging results that were available during my care of the patient were reviewed by me and considered in my medical decision making (see chart for details).     Seth [name] is a 77 y.o. male with a past medical history significant for hypertension prior prostate cancer as well as prior bronchitis requiring albuterol who presents with cough and shortness of breath.  Patient reports that he has had URI-like symptoms for the last 2 weeks including rhinorrhea, congestion, and cough.  He reports the cough is continued and when he had some more shortness of breath that he presented for  evaluation.  He denies any chest pain or palpitations.  He denies any syncopal episodes or lightheadedness.  He denies any leg pain or leg swelling.  He denies any history of CHF or heart disease.  He denies other complaints on arrival and is overall appearing well.  On exam, patient had wheezing in all lung fields.  Patient's chest was nontender to palpation.  Abdomen nontender.  Legs were not edematous.  Patient no focal neurologic deficits and had pulses in all extremities.  EKG showed no STEMI.  Chest x-ray was performed showing no evidence of pneumonia or significant fluid overload.  BNP not elevated and troponin negative initially.     No leukocytosis and no anemia seen.  Metabolic panel reassuring.  Patient did not want to stay for a delta troponin.  Patient was giving a breathing treatment with complete resolution of the shortness of breath and cough.  Suspect the wheezing and reactive airway disease is likely the cause of her symptoms.  Next  Patient reports she does take albuterol in the setting of URIs in the past.  Patient given prescription of an albuterol inhaler and will follow up with his PCP in several days.  Patient did not want admission or further work-up at this time.  Patient had no other questions or concerns and was discharged in good condition with improved symptoms.    Final Clinical Impressions(s) / ED Diagnoses   Final diagnoses:  SOB (shortness of breath)  Cough  Wheeze  Upper respiratory tract infection, unspecified type    ED Discharge Orders        Ordered    albuterol (PROVENTIL HFA;VENTOLIN HFA) 108 (90 Base) MCG/ACT inhaler  Every 6 hours PRN     11/08/17 1320      Clinical Impression: 1. SOB (shortness of breath)   2. Cough   3. Wheeze   4. Upper respiratory tract infection, unspecified type     Disposition: Discharge  Condition: Good  I have discussed the results, Dx and Tx plan with the pt(& family if present). He/she/they expressed  understanding and agree(s) with the plan. Discharge instructions discussed at great length. Strict return precautions discussed and pt &/or family have verbalized understanding of the instructions. No further questions at time of discharge.    Discharge Medication List as of 11/08/2017  1:23 PM    START taking these medications   Details  albuterol (PROVENTIL HFA;VENTOLIN HFA) 108 (90 Base) MCG/ACT inhaler Inhale 1-2 puffs into the lungs every 6 (six) hours as needed for wheezing or shortness of breath., Starting Sun 11/08/2017,  Print        Follow Up: Libby Maw, MD [place] [id number] [phone]     [facility] [place] [facility] [address] [id number] [facility] [place] [phone]       Tegeler, Gwenyth Allegra, MD 11/08/17 [phone]

## 2017-11-08 NOTE — Discharge Instructions (Signed)
Your workup today was overall reassuring.  We did not find evidence of pneumonia, significant abnormality with your blood work, evidence of blood clot, and your chest x-ray was reassuring. We did not see evidence of a heart attack on your EKG and your initial troponin was negative.  Since your breathing improved after the breathing treatment, I suspect it was a reactive airway disease causing her wheezing and shortness of breath.  He may also have a viral upper respiratory infection.  Please stay hydrated and use the inhaler prescription you were provided.  Please follow-up with your primary doctor in several days.  If any symptoms change or worsen, please return to the nearest emergency department.

## 2018-07-27 NOTE — H&P (Signed)
TOTAL KNEE ADMISSION H&P  Patient is being admitted for left total knee arthroplasty.  Subjective:  Chief Complaint:left knee pain.  HPI: Seth [name], 77 y.o. male, has a history of pain and functional disability in the left knee due to arthritis and has failed non-surgical conservative treatments for greater than 12 weeks to includecorticosteriod injections, use of assistive devices and activity modification.  Onset of symptoms was abrupt, starting less than one year ago with rapidly worsening course since that time. The patient noted no past surgery on the left knee(s).  Patient currently rates pain in the left knee(s) at 9 out of 10 with activity. Patient has worsening of pain with activity and weight bearing, pain that interferes with activities of daily living and instability.  Patient has evidence of bone-on-bone arthritis in the medial compartment with patellofemoral spurring by imaging studies. There is no active infection.  Patient Active Problem List   Diagnosis Date Noted  . OA (osteoarthritis) of knee 09/10/2015   Past Medical History:  Diagnosis Date  . Arthritis   . Cancer (HCC)    hx of prostate cancer   . Hypertension   . Pneumonia    06/2015     Past Surgical History:  Procedure Laterality Date  . PROSTATECTOMY    . 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   Procedure: RIGHT TOTAL KNEE ARTHROPLASTY;  Surgeon: Gaynelle Arabian, MD;  Location: [facility];  Service: Orthopedics;  Laterality: Right;    No current facility-administered medications for this encounter.    Current Outpatient Medications  Medication Sig Dispense Refill Last Dose  . albuterol (PROVENTIL HFA;VENTOLIN HFA) 108 (90 Base) MCG/ACT inhaler Inhale 1-2 puffs into the lungs every 6 (six) hours as needed for wheezing or shortness of breath. 1 Inhaler 0   . amLODipine (NORVASC) 10 MG tablet Take 10 mg by mouth every morning.   09/10/2015 at 0630  . atorvastatin (LIPITOR) 40 MG tablet Take 40 mg by mouth  every morning.   0 09/09/2015 at pm  . augmented betamethasone dipropionate (DIPROLENE) 0.05 % ointment Apply topically 2 (two) times daily. 50 g 0   . cyclobenzaprine (FLEXERIL) 10 MG tablet Take 1 tablet (10 mg total) by mouth 3 (three) times daily as needed for muscle spasms. 90 tablet 0   . fluocinonide ointment (LIDEX) 6.27 % Apply 1 application topically 2 (two) times daily. 60 g 0   . HYDROmorphone (DILAUDID) 2 MG tablet Take 1 tablet (2 mg total) by mouth every 3 (three) hours as needed for severe pain. 60 tablet 0   . quinapril (ACCUPRIL) 40 MG tablet Take 40 mg by mouth every morning.   1 09/09/2015 at am  . rivaroxaban (XARELTO) 10 MG TABS tablet Take 1 tablet (10 mg total) by mouth daily with breakfast. Take Xarelto for two and a half more weeks, then discontinue Xarelto. Once the patient has completed the blood thinner regimen, then take a Baby 81 mg Aspirin daily for three more weeks. 19 tablet 0   . traMADol (ULTRAM) 50 MG tablet Take 1-2 tablets (50-100 mg total) by mouth every 6 (six) hours as needed (mild to moderate pain). 80 tablet 1   . TRAVATAN Z 0.004 % SOLN ophthalmic solution Place 1 drop into both eyes at bedtime.  3 09/09/2015 at pm   No Known Allergies  Social History   Tobacco Use  . Smoking status: Former Research scientist (life sciences)  . Smokeless tobacco: Never Used  Substance Use Topics  . Alcohol use: Yes  Comment: occasionally     No family history on file.   Review of Systems  Constitutional: Negative for chills and fever.  HENT: Negative for congestion, sore throat and tinnitus.   Eyes: Negative for double vision, photophobia and pain.  Respiratory: Negative for cough, shortness of breath and wheezing.   Cardiovascular: Negative for chest pain, palpitations and orthopnea.  Gastrointestinal: Negative for heartburn, nausea and vomiting.  Genitourinary: Negative for dysuria, frequency and urgency.  Musculoskeletal: Positive for joint pain.  Neurological: Negative for  dizziness, weakness and headaches.    Objective:  Physical Exam  Well nourished and well developed.  General: Alert and oriented x3, cooperative and pleasant, no acute distress.  Head: normocephalic, atraumatic, neck supple.  Eyes: EOMI.  Respiratory: breath sounds clear in all fields, no wheezing, rales, or rhonchi. Cardiovascular: Regular rate and rhythm, no murmurs, gallops or rubs.  Abdomen: non-tender to palpation and soft, normoactive bowel sounds. Musculoskeletal: Left Knee Exam: Slight effusion. Varus deformity. Range of motion is 5-125 degrees. No crepitus on range of motion of the knee. Positive medial, greater than lateral, joint line tenderness. Stable knee. Calves soft and nontender. Motor function intact in LE. Strength 5/5 LE bilaterally. Neuro: Distal pulses 2+. Sensation to light touch intact in LE.  Vital signs in last 24 hours: Blood pressure: 144/90 mmHg Pulse: 92 bpm  Labs:   Estimated body mass index is 34.52 kg/m as calculated from the following:   Height as of 11/08/17: 5' 11.5" (1.816 m).   Weight as of 11/08/17: 113.9 kg.   Imaging Review Plain radiographs demonstrate severe degenerative joint disease of the left knee(s). The overall alignment isneutral. The bone quality appears to be adequate for age and reported activity level.   Preoperative templating of the joint replacement has been completed, documented, and submitted to the Operating Room personnel in order to optimize intra-operative equipment management.   Anticipated LOS equal to or greater than 2 midnights due to - Age 54 and older with one or more of the following:  - Obesity  - Expected need for hospital services (PT, OT, Nursing) required for safe  discharge  - Anticipated need for postoperative skilled nursing care or inpatient rehab  - Active co-morbidities: None OR   - Unanticipated findings during/Post Surgery: None  - Patient is a high risk of re-admission due to:  None     Assessment/Plan:  End stage arthritis, left knee   The patient history, physical examination, clinical judgment of the provider and imaging studies are consistent with end stage degenerative joint disease of the left knee(s) and total knee arthroplasty is deemed medically necessary. The treatment options including medical management, injection therapy arthroscopy and arthroplasty were discussed at length. The risks and benefits of total knee arthroplasty were presented and reviewed. The risks due to aseptic loosening, infection, stiffness, patella tracking problems, thromboembolic complications and other imponderables were discussed. The patient acknowledged the explanation, agreed to proceed with the plan and consent was signed. Patient is being admitted for inpatient treatment for surgery, pain control, PT, OT, prophylactic antibiotics, VTE prophylaxis, progressive ambulation and ADL's and discharge planning. The patient is planning to be discharged home.   Therapy Plans: outpatient therapy at [facility] ([place]) Disposition: Home with wife Planned DVT Prophylaxis: Xarelto 10 mg daily (hx prostate CA) DME needed: Gilford Rile, 3-in-1 PCP: Jerrell Belfast, PA-C TXA: IV Allergies: NKDA Anesthesia Concerns: None BMI: 35.9  - Patient was instructed on what medications to stop prior to surgery. - Follow-up  visit in 2 weeks with Dr. Wynelle Link - Begin physical therapy following surgery - Pre-operative lab work as pre-surgical testing - Prescriptions will be provided in hospital at time of discharge  Theresa Duty, PA-C Orthopedic Surgery [facility] [place]

## 2018-08-09 NOTE — Progress Notes (Signed)
ekg 07-26-18  chart Clearance Yetta Glassman' Kae Heller PA-C 07-27-18  On  Chart hgb a1c 5.8, cmp, cbc/diff, lipid, all on chart

## 2018-08-09 NOTE — Patient Instructions (Addendum)
Seth [name]  08/09/2018   Your procedure is scheduled on: 08-16-18  Report to [facility] Main  Entrance             Report to admitting at      1115 AM    Call this number if you have problems the morning of surgery [phone]    Remember: Do not eat food or drink liquids :After Midnight.  You may have clear liquids until 0745 am then nothing by mouth    CLEAR LIQUID DIET   Foods Allowed                                                                     Foods Excluded  Coffee and tea, regular and decaf                             liquids that you cannot  Plain Jell-O in any flavor                                             see through such as: Fruit ices (not with fruit pulp)                                     milk, soups, orange juice  Iced Popsicles                                    All solid food Carbonated beverages, regular and diet                                    Cranberry, grape and apple juices Sports drinks like Gatorade Lightly seasoned clear broth or consume(fat free) Sugar, honey syrup  _____________________________________________________________________    BRUSH YOUR TEETH MORNING OF SURGERY AND RINSE YOUR MOUTH OUT, NO CHEWING GUM CANDY OR MINTS.     Take these medicines the morning of surgery with A SIP OF WATER: eye drops as usual, lipitor, amlodipine                                You may not have any metal on your body including hair pins and              piercings  Do not wear jewelry,  lotions, powders or perfumes, deodorant                       Men may shave face and neck.   Do not bring valuables to the hospital. [place].  Contacts, dentures or bridgework may not be worn into  surgery.  Leave suitcase in the car. After surgery it may be brought to your room.                  Please read over the following fact sheets you were  given: _____________________________________________________________________          [facility] - Preparing for Surgery Before surgery, you can play an important role.  Because skin is not sterile, your skin needs to be as free of germs as possible.  You can reduce the number of germs on your skin by washing with CHG (chlorahexidine gluconate) soap before surgery.  CHG is an antiseptic cleaner which kills germs and bonds with the skin to continue killing germs even after washing. Please DO NOT use if you have an allergy to CHG or antibacterial soaps.  If your skin becomes reddened/irritated stop using the CHG and inform your nurse when you arrive at Short Stay. Do not shave (including legs and underarms) for at least 48 hours prior to the first CHG shower.  You may shave your face/neck. Please follow these instructions carefully:  1.  Shower with CHG Soap the night before surgery and the  morning of Surgery.  2.  If you choose to wash your hair, wash your hair first as usual with your  normal  shampoo.  3.  After you shampoo, rinse your hair and body thoroughly to remove the  shampoo.                           4.  Use CHG as you would any other liquid soap.  You can apply chg directly  to the skin and wash                       Gently with a scrungie or clean washcloth.  5.  Apply the CHG Soap to your body ONLY FROM THE NECK DOWN.   Do not use on face/ open                           Wound or open sores. Avoid contact with eyes, ears mouth and genitals (private parts).                       Wash face,  Genitals (private parts) with your normal soap.             6.  Wash thoroughly, paying special attention to the area where your surgery  will be performed.  7.  Thoroughly rinse your body with warm water from the neck down.  8.  DO NOT shower/wash with your normal soap after using and rinsing off  the CHG Soap.                9.  Pat yourself dry with a clean towel.            10.  Wear clean  pajamas.            11.  Place clean sheets on your bed the night of your first shower and do not  sleep with pets. Day of Surgery : Do not apply any lotions/deodorants the morning of surgery.  Please wear clean clothes to the hospital/surgery center.  FAILURE TO FOLLOW THESE INSTRUCTIONS MAY RESULT IN THE CANCELLATION OF YOUR SURGERY PATIENT SIGNATURE_________________________________  NURSE SIGNATURE__________________________________  ________________________________________________________________________  WHAT IS A  BLOOD TRANSFUSION? Blood Transfusion Information  A transfusion is the replacement of blood or some of its parts. Blood is made up of multiple cells which provide different functions.  Red blood cells carry oxygen and are used for blood loss replacement.  White blood cells fight against infection.  Platelets control bleeding.  Plasma helps clot blood.  Other blood products are available for specialized needs, such as hemophilia or other clotting disorders. BEFORE THE TRANSFUSION  Who gives blood for transfusions?   Healthy volunteers who are fully evaluated to make sure their blood is safe. This is blood bank blood. Transfusion therapy is the safest it has ever been in the practice of medicine. Before blood is taken from a donor, a complete history is taken to make sure that person has no history of diseases nor engages in risky social behavior (examples are intravenous drug use or sexual activity with multiple partners). The donor's travel history is screened to minimize risk of transmitting infections, such as malaria. The donated blood is tested for signs of infectious diseases, such as HIV and hepatitis. The blood is then tested to be sure it is compatible with you in order to minimize the chance of a transfusion reaction. If you or a relative donates blood, this is often done in anticipation of surgery and is not appropriate for emergency situations. It takes many days  to process the donated blood. RISKS AND COMPLICATIONS Although transfusion therapy is very safe and saves many lives, the main dangers of transfusion include:   Getting an infectious disease.  Developing a transfusion reaction. This is an allergic reaction to something in the blood you were given. Every precaution is taken to prevent this. The decision to have a blood transfusion has been considered carefully by your caregiver before blood is given. Blood is not given unless the benefits outweigh the risks. AFTER THE TRANSFUSION  Right after receiving a blood transfusion, you will usually feel much better and more energetic. This is especially true if your red blood cells have gotten low (anemic). The transfusion raises the level of the red blood cells which carry oxygen, and this usually causes an energy increase.  The nurse administering the transfusion will monitor you carefully for complications. HOME CARE INSTRUCTIONS  No special instructions are needed after a transfusion. You may find your energy is better. Speak with your caregiver about any limitations on activity for underlying diseases you may have. SEEK MEDICAL CARE IF:   Your condition is not improving after your transfusion.  You develop redness or irritation at the intravenous (IV) site. SEEK IMMEDIATE MEDICAL CARE IF:  Any of the following symptoms occur over the next 12 hours:  Shaking chills.  You have a temperature by mouth above 102 F (38.9 C), not controlled by medicine.  Chest, back, or muscle pain.  People around you feel you are not acting correctly or are confused.  Shortness of breath or difficulty breathing.  Dizziness and fainting.  You get a rash or develop hives.  You have a decrease in urine output.  Your urine turns a dark color or changes to pink, red, or brown. Any of the following symptoms occur over the next 10 days:  You have a temperature by mouth above 102 F (38.9 C), not controlled  by medicine.  Shortness of breath.  Weakness after normal activity.  The white part of the eye turns yellow (jaundice).  You have a decrease in the amount of urine or are urinating less often.  Your urine turns a dark color or changes to pink, red, or brown. Document Released: 07/25/2000 Document Revised: 10/20/2011 Document Reviewed: 03/13/2008 ExitCare Patient Information 2014 [place].  _______________________________________________________________________  Incentive Spirometer  An incentive spirometer is a tool that can help keep your lungs clear and active. This tool measures how well you are filling your lungs with each breath. Taking long deep breaths may help reverse or decrease the chance of developing breathing (pulmonary) problems (especially infection) following:  A long period of time when you are unable to move or be active. BEFORE THE PROCEDURE   If the spirometer includes an indicator to show your best effort, your nurse or respiratory therapist will set it to a desired goal.  If possible, sit up straight or lean slightly forward. Try not to slouch.  Hold the incentive spirometer in an upright position. INSTRUCTIONS FOR USE  1. Sit on the edge of your bed if possible, or sit up as far as you can in bed or on a chair. 2. Hold the incentive spirometer in an upright position. 3. Breathe out normally. 4. Place the mouthpiece in your mouth and seal your lips tightly around it. 5. Breathe in slowly and as deeply as possible, raising the piston or the ball toward the top of the column. 6. Hold your breath for 3-5 seconds or for as long as possible. Allow the piston or ball to fall to the bottom of the column. 7. Remove the mouthpiece from your mouth and breathe out normally. 8. Rest for a few seconds and repeat Steps 1 through 7 at least 10 times every 1-2 hours when you are awake. Take your time and take a few normal breaths between deep breaths. 9. The  spirometer may include an indicator to show your best effort. Use the indicator as a goal to work toward during each repetition. 10. After each set of 10 deep breaths, practice coughing to be sure your lungs are clear. If you have an incision (the cut made at the time of surgery), support your incision when coughing by placing a pillow or rolled up towels firmly against it. Once you are able to get out of bed, walk around indoors and cough well. You may stop using the incentive spirometer when instructed by your caregiver.  RISKS AND COMPLICATIONS  Take your time so you do not get dizzy or light-headed.  If you are in pain, you may need to take or ask for pain medication before doing incentive spirometry. It is harder to take a deep breath if you are having pain. AFTER USE  Rest and breathe slowly and easily.  It can be helpful to keep track of a log of your progress. Your caregiver can provide you with a simple table to help with this. If you are using the spirometer at home, follow these instructions: [place] IF:   You are having difficultly using the spirometer.  You have trouble using the spirometer as often as instructed.  Your pain medication is not giving enough relief while using the spirometer.  You develop fever of 100.5 F (38.1 C) or higher. SEEK IMMEDIATE MEDICAL CARE IF:   You cough up bloody sputum that had not been present before.  You develop fever of 102 F (38.9 C) or greater.  You develop worsening pain at or near the incision site. MAKE SURE YOU:   Understand these instructions.  Will watch your condition.  Will get help right away if you are not  doing well or get worse. Document Released: 12/08/2006 Document Revised: 10/20/2011 Document Reviewed: 02/08/2007 [facility] Patient Information 2014 [place], [place].   ________________________________________________________________________

## 2018-08-12 ENCOUNTER — Other Ambulatory Visit: Payer: Self-pay

## 2018-08-12 ENCOUNTER — Encounter (HOSPITAL_COMMUNITY): Payer: Self-pay

## 2018-08-12 ENCOUNTER — Encounter (HOSPITAL_COMMUNITY)
Admission: RE | Admit: 2018-08-12 | Discharge: 2018-08-12 | Disposition: A | Discharge status: Home / Self Care | Payer: Medicare Other | Source: Ambulatory Visit | Attending: Orthopedic Surgery | Admitting: Orthopedic Surgery

## 2018-08-12 DIAGNOSIS — M1712 Unilateral primary osteoarthritis, left knee: Secondary | ICD-10-CM | POA: Diagnosis not present

## 2018-08-12 DIAGNOSIS — Z01812 Encounter for preprocedural laboratory examination: Secondary | ICD-10-CM | POA: Diagnosis not present

## 2018-08-12 HISTORY — DX: Personal history of urinary calculi: Z87.442

## 2018-08-12 LAB — COMPREHENSIVE METABOLIC PANEL
ALBUMIN: 4.3 g/dL (ref 3.5–5.0)
ALT: 20 U/L (ref 0–44)
AST: 25 U/L (ref 15–41)
Alkaline Phosphatase: 96 U/L (ref 38–126)
Anion gap: 8 (ref 5–15)
BUN: 11 mg/dL (ref 8–23)
CO2: 26 mmol/L (ref 22–32)
Calcium: 9.3 mg/dL (ref 8.9–10.3)
Chloride: 105 mmol/L (ref 98–111)
Creatinine, Ser: 1.25 mg/dL — ABNORMAL HIGH (ref 0.61–1.24)
GFR calc Af Amer: >60 mL/min (ref >60)
GFR calc non Af Amer: 56 mL/min — ABNORMAL LOW (ref >60)
Glucose, Bld: 101 mg/dL — ABNORMAL HIGH (ref 70–99)
Potassium: 4.6 mmol/L (ref 3.5–5.1)
Sodium: 139 mmol/L (ref 135–145)
Total Bilirubin: 0.7 mg/dL (ref 0.3–1.2)
Total Protein: 7.9 g/dL (ref 6.5–8.1)

## 2018-08-12 LAB — CBC
HCT: 47.5 % (ref 39.0–52.0)
Hemoglobin: 14.8 g/dL (ref 13.0–17.0)
MCH: 27.9 pg (ref 26.0–34.0)
MCHC: 31.2 g/dL (ref 30.0–36.0)
MCV: 89.5 fL (ref 80.0–100.0)
NRBC: 0 % (ref 0.0–0.2)
Platelets: 240 10*3/uL (ref 150–400)
RBC: 5.31 MIL/uL (ref 4.22–5.81)
RDW: 14.6 % (ref 11.5–15.5)
WBC: 4.6 10*3/uL (ref 4.0–10.5)

## 2018-08-12 LAB — PROTIME-INR
INR: 0.95
Prothrombin Time: 12.6 seconds (ref 11.4–15.2)

## 2018-08-12 LAB — APTT: APTT: 38 s — AB (ref 24–36)

## 2018-08-12 LAB — SURGICAL PCR SCREEN
MRSA, PCR: NEGATIVE
Staphylococcus aureus: NEGATIVE

## 2018-08-12 NOTE — Progress Notes (Addendum)
RVA:CQPEAK O connor PA-c, Abelino Derrick MD  CARDIOLOGIST:none  INFO IN Epic:[id number]   INFO ON CHART:Clearance from PCP   BLOOD THINNERS AND LAST DOSES: none  ____________________________________  PATIENT SYMPTOMS AT TIME OF PREOP: none history of HTN

## 2018-08-15 MED ORDER — BUPIVACAINE LIPOSOME 1.3 % IJ SUSP
20.0000 mL | Freq: Once | INTRAMUSCULAR | Status: DC
  Filled 2018-08-15: qty 20

## 2018-08-16 ENCOUNTER — Encounter (HOSPITAL_COMMUNITY): Payer: Self-pay | Admitting: *Deleted

## 2018-08-16 ENCOUNTER — Other Ambulatory Visit: Payer: Self-pay

## 2018-08-16 ENCOUNTER — Ambulatory Visit (HOSPITAL_COMMUNITY): Payer: Medicare Other | Admitting: Physician Assistant

## 2018-08-16 ENCOUNTER — Encounter (HOSPITAL_COMMUNITY)
Admission: RE | Disposition: A | Discharge status: Home / Self Care | Payer: Self-pay | Source: Home / Self Care | Attending: Orthopedic Surgery

## 2018-08-16 ENCOUNTER — Observation Stay (HOSPITAL_COMMUNITY)
Admission: RE | Admit: 2018-08-16 | Discharge: 2018-08-18 | Disposition: A | Discharge status: Home / Self Care | Payer: Medicare Other | Attending: Orthopedic Surgery | Admitting: Orthopedic Surgery

## 2018-08-16 ENCOUNTER — Ambulatory Visit (HOSPITAL_COMMUNITY): Payer: Medicare Other | Admitting: Anesthesiology

## 2018-08-16 DIAGNOSIS — Z8546 Personal history of malignant neoplasm of prostate: Secondary | ICD-10-CM | POA: Diagnosis not present

## 2018-08-16 DIAGNOSIS — E669 Obesity, unspecified: Secondary | ICD-10-CM | POA: Insufficient documentation

## 2018-08-16 DIAGNOSIS — Z96651 Presence of right artificial knee joint: Secondary | ICD-10-CM | POA: Diagnosis not present

## 2018-08-16 DIAGNOSIS — Z6835 Body mass index (BMI) 35.0-35.9, adult: Secondary | ICD-10-CM | POA: Diagnosis not present

## 2018-08-16 DIAGNOSIS — Z7901 Long term (current) use of anticoagulants: Secondary | ICD-10-CM | POA: Diagnosis not present

## 2018-08-16 DIAGNOSIS — Z79899 Other long term (current) drug therapy: Secondary | ICD-10-CM | POA: Insufficient documentation

## 2018-08-16 DIAGNOSIS — Z87891 Personal history of nicotine dependence: Secondary | ICD-10-CM | POA: Diagnosis not present

## 2018-08-16 DIAGNOSIS — M1712 Unilateral primary osteoarthritis, left knee: Secondary | ICD-10-CM | POA: Diagnosis present

## 2018-08-16 DIAGNOSIS — Z7951 Long term (current) use of inhaled steroids: Secondary | ICD-10-CM | POA: Diagnosis not present

## 2018-08-16 DIAGNOSIS — M1710 Unilateral primary osteoarthritis, unspecified knee: Secondary | ICD-10-CM | POA: Diagnosis present

## 2018-08-16 DIAGNOSIS — Z7982 Long term (current) use of aspirin: Secondary | ICD-10-CM | POA: Diagnosis not present

## 2018-08-16 DIAGNOSIS — Z87442 Personal history of urinary calculi: Secondary | ICD-10-CM | POA: Insufficient documentation

## 2018-08-16 DIAGNOSIS — I10 Essential (primary) hypertension: Secondary | ICD-10-CM | POA: Diagnosis not present

## 2018-08-16 DIAGNOSIS — R410 Disorientation, unspecified: Secondary | ICD-10-CM | POA: Diagnosis not present

## 2018-08-16 DIAGNOSIS — M179 Osteoarthritis of knee, unspecified: Secondary | ICD-10-CM | POA: Diagnosis present

## 2018-08-16 HISTORY — PX: TOTAL KNEE ARTHROPLASTY: SHX125

## 2018-08-16 LAB — TYPE AND SCREEN
ABO/RH(D): O POS
Antibody Screen: NEGATIVE

## 2018-08-16 SURGERY — ARTHROPLASTY, KNEE, TOTAL
Anesthesia: Spinal | Laterality: Left

## 2018-08-16 MED ORDER — OXYCODONE HCL 5 MG PO TABS
5.0000 mg | ORAL_TABLET | ORAL | Status: DC | PRN
  Administered 2018-08-16: 5 mg via ORAL
  Administered 2018-08-16 – 2018-08-18 (×5): 10 mg via ORAL
  Filled 2018-08-16 (×4): qty 2

## 2018-08-16 MED ORDER — ACETAMINOPHEN 500 MG PO TABS
ORAL_TABLET | ORAL | Status: AC
  Filled 2018-08-16: qty 2

## 2018-08-16 MED ORDER — MIDAZOLAM HCL 2 MG/2ML IJ SOLN
1.0000 mg | INTRAMUSCULAR | Status: DC
  Filled 2018-08-16: qty 2

## 2018-08-16 MED ORDER — MORPHINE SULFATE (PF) 4 MG/ML IV SOLN
INTRAVENOUS | Status: AC
  Filled 2018-08-16: qty 1

## 2018-08-16 MED ORDER — QUINAPRIL HCL 10 MG PO TABS
40.0000 mg | ORAL_TABLET | Freq: Every day | ORAL | Status: DC
  Filled 2018-08-16: qty 4

## 2018-08-16 MED ORDER — PROPOFOL 10 MG/ML IV BOLUS
INTRAVENOUS | Status: AC
  Filled 2018-08-16: qty 20

## 2018-08-16 MED ORDER — METHOCARBAMOL 500 MG PO TABS
500.0000 mg | ORAL_TABLET | Freq: Four times a day (QID) | ORAL | Status: DC | PRN
  Administered 2018-08-17 – 2018-08-18 (×3): 500 mg via ORAL
  Filled 2018-08-16 (×4): qty 1

## 2018-08-16 MED ORDER — DEXAMETHASONE SODIUM PHOSPHATE 10 MG/ML IJ SOLN
10.0000 mg | Freq: Once | INTRAMUSCULAR | Status: AC
  Administered 2018-08-17: 10 mg via INTRAVENOUS
  Filled 2018-08-16: qty 1

## 2018-08-16 MED ORDER — BISACODYL 10 MG RE SUPP: 10.0000 mg | Freq: Every day | RECTAL | Status: DC | PRN

## 2018-08-16 MED ORDER — PROPOFOL 10 MG/ML IV BOLUS
INTRAVENOUS | Status: DC | PRN
  Administered 2018-08-16: 40 mg via INTRAVENOUS
  Administered 2018-08-16: 20 mg via INTRAVENOUS

## 2018-08-16 MED ORDER — ATORVASTATIN CALCIUM 40 MG PO TABS
40.0000 mg | ORAL_TABLET | Freq: Every day | ORAL | Status: DC
  Administered 2018-08-17 – 2018-08-18 (×2): 40 mg via ORAL
  Filled 2018-08-16 (×2): qty 1

## 2018-08-16 MED ORDER — BUPIVACAINE LIPOSOME 1.3 % IJ SUSP
INTRAMUSCULAR | Status: DC | PRN
  Administered 2018-08-16: 20 mL

## 2018-08-16 MED ORDER — CEFAZOLIN SODIUM-DEXTROSE 2-4 GM/100ML-% IV SOLN: 2.0000 g | Freq: Four times a day (QID) | INTRAVENOUS | Status: DC

## 2018-08-16 MED ORDER — DIPHENHYDRAMINE HCL 12.5 MG/5ML PO ELIX
12.5000 mg | ORAL_SOLUTION | ORAL | Status: DC | PRN
  Filled 2018-08-16: qty 10

## 2018-08-16 MED ORDER — OXYCODONE HCL 5 MG PO TABS
ORAL_TABLET | ORAL | Status: AC
  Filled 2018-08-16: qty 2

## 2018-08-16 MED ORDER — RIVAROXABAN 10 MG PO TABS
10.0000 mg | ORAL_TABLET | Freq: Every day | ORAL | Status: DC
  Administered 2018-08-17 – 2018-08-18 (×2): 10 mg via ORAL
  Filled 2018-08-16 (×2): qty 1

## 2018-08-16 MED ORDER — CHLORHEXIDINE GLUCONATE 4 % EX LIQD: 60.0000 mL | Freq: Once | CUTANEOUS | Status: DC

## 2018-08-16 MED ORDER — SODIUM CHLORIDE (PF) 0.9 % IJ SOLN
INTRAMUSCULAR | Status: AC
  Filled 2018-08-16: qty 50

## 2018-08-16 MED ORDER — CLONIDINE HCL (ANALGESIA) 100 MCG/ML EP SOLN
EPIDURAL | Status: DC | PRN
  Administered 2018-08-16: 70 ug

## 2018-08-16 MED ORDER — AMLODIPINE BESYLATE 10 MG PO TABS
10.0000 mg | ORAL_TABLET | Freq: Every day | ORAL | Status: DC
  Administered 2018-08-17 – 2018-08-18 (×3): 10 mg via ORAL
  Filled 2018-08-16 (×3): qty 1

## 2018-08-16 MED ORDER — CEFAZOLIN SODIUM-DEXTROSE 2-4 GM/100ML-% IV SOLN
INTRAVENOUS | Status: AC
  Administered 2018-08-16: 2000 mg
  Filled 2018-08-16: qty 100

## 2018-08-16 MED ORDER — HYDROMORPHONE HCL 1 MG/ML IJ SOLN: 0.2500 mg | INTRAMUSCULAR | Status: DC | PRN

## 2018-08-16 MED ORDER — POLYETHYLENE GLYCOL 3350 17 G PO PACK: 17.0000 g | PACK | Freq: Every day | ORAL | Status: DC | PRN

## 2018-08-16 MED ORDER — FENTANYL CITRATE (PF) 100 MCG/2ML IJ SOLN
50.0000 ug | INTRAMUSCULAR | Status: DC
  Administered 2018-08-16: 100 ug via INTRAVENOUS
  Filled 2018-08-16: qty 2

## 2018-08-16 MED ORDER — SODIUM CHLORIDE 0.9 % IR SOLN
Status: DC | PRN
  Administered 2018-08-16: 1000 mL

## 2018-08-16 MED ORDER — LATANOPROST 0.005 % OP SOLN
1.0000 [drp] | Freq: Every day | OPHTHALMIC | Status: DC
  Filled 2018-08-16: qty 2.5

## 2018-08-16 MED ORDER — TRANEXAMIC ACID-NACL 1000-0.7 MG/100ML-% IV SOLN
1000.0000 mg | INTRAVENOUS | Status: AC
  Administered 2018-08-16: 1000 mg via INTRAVENOUS
  Filled 2018-08-16: qty 100

## 2018-08-16 MED ORDER — PHENOL 1.4 % MT LIQD
1.0000 | OROMUCOSAL | Status: DC | PRN
  Filled 2018-08-16: qty 177

## 2018-08-16 MED ORDER — MENTHOL 3 MG MT LOZG: 1.0000 | LOZENGE | OROMUCOSAL | Status: DC | PRN

## 2018-08-16 MED ORDER — TRAMADOL HCL 50 MG PO TABS: 50.0000 mg | ORAL_TABLET | Freq: Four times a day (QID) | ORAL | Status: DC | PRN

## 2018-08-16 MED ORDER — SODIUM CHLORIDE (PF) 0.9 % IJ SOLN
INTRAMUSCULAR | Status: AC
  Filled 2018-08-16: qty 10

## 2018-08-16 MED ORDER — METHOCARBAMOL 500 MG IVPB - SIMPLE MED
500.0000 mg | Freq: Four times a day (QID) | INTRAVENOUS | Status: DC | PRN
  Administered 2018-08-16: 500 mg via INTRAVENOUS
  Filled 2018-08-16: qty 50

## 2018-08-16 MED ORDER — SODIUM CHLORIDE 0.9 % IV SOLN
INTRAVENOUS | Status: DC
  Administered 2018-08-17: 02:00:00 via INTRAVENOUS

## 2018-08-16 MED ORDER — DEXAMETHASONE SODIUM PHOSPHATE 10 MG/ML IJ SOLN
8.0000 mg | Freq: Once | INTRAMUSCULAR | Status: AC
  Administered 2018-08-16: 8 mg via INTRAVENOUS

## 2018-08-16 MED ORDER — ACETAMINOPHEN 10 MG/ML IV SOLN
1000.0000 mg | Freq: Four times a day (QID) | INTRAVENOUS | Status: DC
  Administered 2018-08-16: 1000 mg via INTRAVENOUS
  Filled 2018-08-16: qty 100

## 2018-08-16 MED ORDER — BUPIVACAINE IN DEXTROSE 0.75-8.25 % IT SOLN
INTRATHECAL | Status: DC | PRN
  Administered 2018-08-16: 1.8 mL via INTRATHECAL

## 2018-08-16 MED ORDER — FLEET ENEMA 7-19 GM/118ML RE ENEM: 1.0000 | ENEMA | Freq: Once | RECTAL | Status: DC | PRN

## 2018-08-16 MED ORDER — SODIUM CHLORIDE 0.9 % IV SOLN
INTRAVENOUS | Status: DC | PRN
  Administered 2018-08-16: 40 ug/min via INTRAVENOUS

## 2018-08-16 MED ORDER — LACTATED RINGERS IV SOLN
INTRAVENOUS | Status: DC
  Administered 2018-08-16 (×3): via INTRAVENOUS

## 2018-08-16 MED ORDER — ONDANSETRON HCL 4 MG PO TABS: 4.0000 mg | ORAL_TABLET | Freq: Four times a day (QID) | ORAL | Status: DC | PRN

## 2018-08-16 MED ORDER — ONDANSETRON HCL 4 MG/2ML IJ SOLN
INTRAMUSCULAR | Status: DC | PRN
  Administered 2018-08-16: 4 mg via INTRAVENOUS

## 2018-08-16 MED ORDER — CEFAZOLIN SODIUM-DEXTROSE 2-4 GM/100ML-% IV SOLN
2.0000 g | INTRAVENOUS | Status: AC
  Administered 2018-08-16: 2 g via INTRAVENOUS
  Filled 2018-08-16: qty 100

## 2018-08-16 MED ORDER — ROPIVACAINE HCL 7.5 MG/ML IJ SOLN
INTRAMUSCULAR | Status: DC | PRN
  Administered 2018-08-16: 20 mL via PERINEURAL

## 2018-08-16 MED ORDER — PROMETHAZINE HCL 25 MG/ML IJ SOLN: 6.2500 mg | INTRAMUSCULAR | Status: DC | PRN

## 2018-08-16 MED ORDER — METOCLOPRAMIDE HCL 5 MG PO TABS: 5.0000 mg | ORAL_TABLET | Freq: Three times a day (TID) | ORAL | Status: DC | PRN

## 2018-08-16 MED ORDER — ONDANSETRON HCL 4 MG/2ML IJ SOLN: 4.0000 mg | Freq: Four times a day (QID) | INTRAMUSCULAR | Status: DC | PRN

## 2018-08-16 MED ORDER — OXYCODONE HCL 5 MG PO TABS
ORAL_TABLET | ORAL | Status: AC
  Filled 2018-08-16: qty 1

## 2018-08-16 MED ORDER — METHOCARBAMOL 500 MG IVPB - SIMPLE MED
INTRAVENOUS | Status: AC
  Filled 2018-08-16: qty 50

## 2018-08-16 MED ORDER — SODIUM CHLORIDE (PF) 0.9 % IJ SOLN
INTRAMUSCULAR | Status: DC | PRN
  Administered 2018-08-16: 60 mL

## 2018-08-16 MED ORDER — METOCLOPRAMIDE HCL 5 MG/ML IJ SOLN: 5.0000 mg | Freq: Three times a day (TID) | INTRAMUSCULAR | Status: DC | PRN

## 2018-08-16 MED ORDER — CEFAZOLIN SODIUM-DEXTROSE 2-4 GM/100ML-% IV SOLN
INTRAVENOUS | Status: AC
  Filled 2018-08-16: qty 100

## 2018-08-16 MED ORDER — DOCUSATE SODIUM 100 MG PO CAPS
100.0000 mg | ORAL_CAPSULE | Freq: Two times a day (BID) | ORAL | Status: DC
  Administered 2018-08-17 – 2018-08-18 (×2): 100 mg via ORAL
  Filled 2018-08-16 (×3): qty 1

## 2018-08-16 MED ORDER — 0.9 % SODIUM CHLORIDE (POUR BTL) OPTIME
TOPICAL | Status: DC | PRN
  Administered 2018-08-16: 1000 mL

## 2018-08-16 MED ORDER — PROPOFOL 500 MG/50ML IV EMUL
INTRAVENOUS | Status: DC | PRN
  Administered 2018-08-16: 75 ug/kg/min via INTRAVENOUS

## 2018-08-16 MED ORDER — ACETAMINOPHEN 500 MG PO TABS
1000.0000 mg | ORAL_TABLET | Freq: Four times a day (QID) | ORAL | Status: AC
  Administered 2018-08-16 – 2018-08-17 (×3): 1000 mg via ORAL
  Filled 2018-08-16 (×2): qty 2

## 2018-08-16 MED ORDER — PROPOFOL 10 MG/ML IV BOLUS
INTRAVENOUS | Status: AC
  Filled 2018-08-16: qty 60

## 2018-08-16 MED ORDER — MORPHINE SULFATE (PF) 4 MG/ML IV SOLN
1.0000 mg | INTRAVENOUS | Status: DC | PRN
  Administered 2018-08-16: 1 mg via INTRAVENOUS

## 2018-08-16 SURGICAL SUPPLY — 60 items
BAG SPEC THK2 15X12 ZIP CLS (MISCELLANEOUS) ×1
BAG ZIPLOCK 12X15 (MISCELLANEOUS) ×3 IMPLANT
BANDAGE ACE 4X5 VEL STRL LF (GAUZE/BANDAGES/DRESSINGS) ×2 IMPLANT
BANDAGE ACE 6X5 VEL STRL LF (GAUZE/BANDAGES/DRESSINGS) ×1 IMPLANT
BLADE SAG 18X100X1.27 (BLADE) ×3 IMPLANT
BLADE SAW SGTL 11.0X1.19X90.0M (BLADE) ×3 IMPLANT
BLADE SURG SZ10 CARB STEEL (BLADE) ×4 IMPLANT
BOWL SMART MIX CTS (DISPOSABLE) ×3 IMPLANT
CEMENT HV SMART SET (Cement) ×6 IMPLANT
CEMENT TIBIA MBT SIZE 5 (Knees) IMPLANT
CLOSURE WOUND 1/2 X4 (GAUZE/BANDAGES/DRESSINGS) ×2
COVER SURGICAL LIGHT HANDLE (MISCELLANEOUS) ×3 IMPLANT
COVER WAND RF STERILE (DRAPES) ×2 IMPLANT
CUFF TOURN SGL QUICK 34 (TOURNIQUET CUFF) ×3
CUFF TRNQT CYL 34X4X40X1 (TOURNIQUET CUFF) ×1 IMPLANT
DECANTER SPIKE VIAL GLASS SM (MISCELLANEOUS) ×3 IMPLANT
DRAPE U-SHAPE 47X51 STRL (DRAPES) ×1 IMPLANT
DRSG ADAPTIC 3X8 NADH LF (GAUZE/BANDAGES/DRESSINGS) ×3 IMPLANT
DRSG PAD ABDOMINAL 8X10 ST (GAUZE/BANDAGES/DRESSINGS) ×1 IMPLANT
DURAPREP 26ML APPLICATOR (WOUND CARE) ×3 IMPLANT
ELECT REM PT RETURN 15FT ADLT (MISCELLANEOUS) ×3 IMPLANT
EVACUATOR 1/8 PVC DRAIN (DRAIN) ×3 IMPLANT
FEMUR SIGMA PS SZ 6.0 L (Femur) ×2 IMPLANT
GAUZE SPONGE 4X4 12PLY STRL (GAUZE/BANDAGES/DRESSINGS) ×3 IMPLANT
GLOVE BIO SURGEON STRL SZ7 (GLOVE) ×1 IMPLANT
GLOVE BIO SURGEON STRL SZ8 (GLOVE) ×3 IMPLANT
GLOVE BIOGEL PI IND STRL 6.5 (GLOVE) ×1 IMPLANT
GLOVE BIOGEL PI IND STRL 7.0 (GLOVE) ×1 IMPLANT
GLOVE BIOGEL PI IND STRL 8 (GLOVE) ×1 IMPLANT
GLOVE BIOGEL PI INDICATOR 6.5 (GLOVE) ×2
GLOVE BIOGEL PI INDICATOR 7.0 (GLOVE)
GLOVE BIOGEL PI INDICATOR 8 (GLOVE) ×2
GLOVE SURG SS PI 6.5 STRL IVOR (GLOVE) ×3 IMPLANT
GOWN STRL REUS W/TWL LRG LVL3 (GOWN DISPOSABLE) ×4 IMPLANT
GOWN STRL REUS W/TWL XL LVL3 (GOWN DISPOSABLE) ×3 IMPLANT
HANDPIECE INTERPULSE COAX TIP (DISPOSABLE) ×3
HOLDER FOLEY CATH W/STRAP (MISCELLANEOUS) ×2 IMPLANT
IMMOBILIZER KNEE 20 (SOFTGOODS) ×3
IMMOBILIZER KNEE 20 THIGH 36 (SOFTGOODS) ×1 IMPLANT
INSERT PFC SIG STB SZ 6 17.5MM (Knees) ×2 IMPLANT
MANIFOLD NEPTUNE II (INSTRUMENTS) ×3 IMPLANT
NS IRRIG 1000ML POUR BTL (IV SOLUTION) ×1 IMPLANT
PACK TOTAL KNEE CUSTOM (KITS) ×3 IMPLANT
PAD ABD 8X10 STRL (GAUZE/BANDAGES/DRESSINGS) ×2 IMPLANT
PADDING CAST COTTON 6X4 STRL (CAST SUPPLIES) ×7 IMPLANT
PATELLA DOME PFC 41MM (Knees) ×2 IMPLANT
PIN STEINMAN FIXATION KNEE (PIN) ×2 IMPLANT
PROTECTOR NERVE ULNAR (MISCELLANEOUS) ×3 IMPLANT
SET HNDPC FAN SPRY TIP SCT (DISPOSABLE) ×1 IMPLANT
STRIP CLOSURE SKIN 1/2X4 (GAUZE/BANDAGES/DRESSINGS) ×4 IMPLANT
SUT MNCRL AB 4-0 PS2 18 (SUTURE) ×3 IMPLANT
SUT STRATAFIX 0 PDS 27 VIOLET (SUTURE) ×3
SUT VIC AB 2-0 CT1 27 (SUTURE) ×9
SUT VIC AB 2-0 CT1 TAPERPNT 27 (SUTURE) ×3 IMPLANT
SUTURE STRATFX 0 PDS 27 VIOLET (SUTURE) ×1 IMPLANT
TIBIA MBT CEMENT SIZE 5 (Knees) ×3 IMPLANT
TRAY FOLEY MTR SLVR 16FR STAT (SET/KITS/TRAYS/PACK) ×3 IMPLANT
WATER STERILE IRR 1000ML POUR (IV SOLUTION) ×6 IMPLANT
WRAP KNEE MAXI GEL POST OP (GAUZE/BANDAGES/DRESSINGS) ×3 IMPLANT
YANKAUER SUCT BULB TIP 10FT TU (MISCELLANEOUS) ×3 IMPLANT

## 2018-08-16 NOTE — Anesthesia Procedure Notes (Signed)
Spinal  Patient location during procedure: OR Start time: 08/16/2018 2:01 PM End time: 08/16/2018 2:08 PM Reason for block: at surgeon's request Staffing Resident/CRNA: Anne Fu, CRNA Performed: resident/CRNA  Preanesthetic Checklist Completed: patient identified, site marked, surgical consent, pre-op evaluation, timeout performed, IV checked, risks and benefits discussed and monitors and equipment checked Spinal Block Patient position: sitting Prep: DuraPrep Patient monitoring: heart rate, continuous pulse ox and blood pressure Approach: midline Location: L2-3 Injection technique: single-shot Needle Needle type: Pencan  Needle gauge: 24 G Needle length: 9 cm Assessment Sensory level: T6 Additional Notes  Functioning IV was confirmed and monitors were applied. Expiration date of kit checked and confirmed. Sterile prep and drape, including hand hygiene and sterile gloves were used. The patient was positioned and the spine was prepped. The skin was anesthetized with lidocaine.  Free flow of clear CSF was obtained prior to injecting local anesthetic into the CSF X 1 attempt.  The spinal needle aspirated freely following injection.  The needle was carefully withdrawn. Patient tolerated procedure well, without complications. Loss of motor and sensory on exam post injection.

## 2018-08-16 NOTE — Transfer of Care (Signed)
Immediate Anesthesia Transfer of Care Note  Patient: Seth [name]  Procedure(s) Performed: Procedure(s) with comments: TOTAL KNEE ARTHROPLASTY (Left) - 59min  Patient Location: PACU  Anesthesia Type:Spinal  Level of Consciousness:  sedated, patient cooperative and responds to stimulation  Airway & Oxygen Therapy:Patient Spontanous Breathing and Patient connected to face mask oxgen  Post-op Assessment:  Report given to PACU RN and Post -op Vital signs reviewed and stable  Post vital signs:  Reviewed and stable  Last Vitals:  Vitals:   08/16/18 1340 08/16/18 1344  BP: (!) 131/91   Pulse: 90 88  Resp: 18 19  Temp:    SpO2: 26% 94%    Complications: No apparent anesthesia complications

## 2018-08-16 NOTE — Discharge Instructions (Addendum)
° °Dr. Frank Aluisio °Total Joint Specialist °[facility] °[address] °[place], [place] [address] °[phone] ° °TOTAL KNEE REPLACEMENT POSTOPERATIVE DIRECTIONS ° °Knee Rehabilitation, Guidelines Following Surgery  °Results after knee surgery are often greatly improved when you follow the exercise, range of motion and muscle strengthening exercises prescribed by your doctor. Safety measures are also important to protect the knee from further injury. Any time any of these exercises cause you to have increased pain or swelling in your knee joint, decrease the amount until you are comfortable again and slowly increase them. If you have problems or questions, call your caregiver or physical therapist for advice.  ° °HOME CARE INSTRUCTIONS  °• Remove items at home which could result in a fall. This includes throw rugs or furniture in walking pathways.  °· ICE to the affected knee every three hours for 30 minutes at a time and then as needed for pain and swelling.  Continue to use ice on the knee for pain and swelling from surgery. You may notice swelling that will progress down to the foot and ankle.  This is normal after surgery.  Elevate the leg when you are not up walking on it.   °· Continue to use the breathing machine which will help keep your temperature down.  It is common for your temperature to cycle up and down following surgery, especially at night when you are not up moving around and exerting yourself.  The breathing machine keeps your lungs expanded and your temperature down. °· Do not place pillow under knee, focus on keeping the knee straight while resting ° °DIET °You may resume your previous home diet once your are discharged from the hospital. ° °DRESSING / WOUND CARE / SHOWERING °You may shower 3 days after surgery, but keep the wounds dry during showering.  You may use an occlusive plastic wrap (Press'n Seal for example), NO SOAKING/SUBMERGING IN THE BATHTUB.  If the bandage  gets wet, change with a clean dry gauze.  If the incision gets wet, pat the wound dry with a clean towel. °You may start showering once you are discharged home but do not submerge the incision under water. Just pat the incision dry and apply a dry gauze dressing on daily. °Change the surgical dressing daily and reapply a dry dressing each time. ° °ACTIVITY °Walk with your walker as instructed. °Use walker as long as suggested by your caregivers. °Avoid periods of inactivity such as sitting longer than an hour when not asleep. This helps prevent blood clots.  °You may resume a sexual relationship in one month or when given the OK by your doctor.  °You may return to work once you are cleared by your doctor.  °Do not drive a car for 6 weeks or until released by you surgeon.  °Do not drive while taking narcotics. ° °WEIGHT BEARING °Weight bearing as tolerated with assist device (walker, cane, etc) as directed, use it as long as suggested by your surgeon or therapist, typically at least 4-6 weeks. ° °POSTOPERATIVE CONSTIPATION PROTOCOL °Constipation - defined medically as fewer than three stools per week and severe constipation as less than one stool per week. ° °One of the most common issues patients have following surgery is constipation.  Even if you have a regular bowel pattern at home, your normal regimen is likely to be disrupted due to multiple reasons following surgery.  Combination of anesthesia, postoperative narcotics, change in appetite and fluid intake all can affect your bowels.    In order to avoid complications following surgery, here are some recommendations in order to help you during your recovery period. ° °Colace (docusate) - Pick up an over-the-counter form of Colace or another stool softener and take twice a day as long as you are requiring postoperative pain medications.  Take with a full glass of water daily.  If you experience loose stools or diarrhea, hold the colace until you stool forms back  up.  If your symptoms do not get better within 1 week or if they get worse, check with your doctor. ° °Dulcolax (bisacodyl) - Pick up over-the-counter and take as directed by the product packaging as needed to assist with the movement of your bowels.  Take with a full glass of water.  Use this product as needed if not relieved by Colace only.  ° °MiraLax (polyethylene glycol) - Pick up over-the-counter to have on hand.  MiraLax is a solution that will increase the amount of water in your bowels to assist with bowel movements.  Take as directed and can mix with a glass of water, juice, soda, coffee, or tea.  Take if you go more than two days without a movement. °Do not use MiraLax more than once per day. Call your doctor if you are still constipated or irregular after using this medication for 7 days in a row. ° °If you continue to have problems with postoperative constipation, please contact the office for further assistance and recommendations.  If you experience "the worst abdominal pain ever" or develop nausea or vomiting, please contact the office immediatly for further recommendations for treatment. ° °ITCHING ° If you experience itching with your medications, try taking only a single pain pill, or even half a pain pill at a time.  You can also use Benadryl over the counter for itching or also to help with sleep.  ° °TED HOSE STOCKINGS °Wear the elastic stockings on both legs for three weeks following surgery during the day but you may remove then at night for sleeping. ° °MEDICATIONS °See your medication summary on the “After Visit Summary” that the nursing staff will review with you prior to discharge.  You may have some home medications which will be placed on hold until you complete the course of blood thinner medication.  It is important for you to complete the blood thinner medication as prescribed by your surgeon.  Continue your approved medications as instructed at time of discharge. ° °PRECAUTIONS °If  you experience chest pain or shortness of breath - call 911 immediately for transfer to the hospital emergency department.  °If you develop a fever greater that 101 F, purulent drainage from wound, increased redness or drainage from wound, foul odor from the wound/dressing, or calf pain - CONTACT YOUR SURGEON.   °                                                °FOLLOW-UP APPOINTMENTS °Make sure you keep all of your appointments after your operation with your surgeon and caregivers. You should call the office at the above phone number and make an appointment for approximately two weeks after the date of your surgery or on the date instructed by your surgeon outlined in the "After Visit Summary". ° ° °RANGE OF MOTION AND STRENGTHENING EXERCISES  °Rehabilitation of the knee is important following a knee injury or   an operation. After just a few days of immobilization, the muscles of the thigh which control the knee become weakened and shrink (atrophy). Knee exercises are designed to build up the tone and strength of the thigh muscles and to improve knee motion. Often times heat used for twenty to thirty minutes before working out will loosen up your tissues and help with improving the range of motion but do not use heat for the first two weeks following surgery. These exercises can be done on a training (exercise) mat, on the floor, on a table or on a bed. Use what ever works the best and is most comfortable for you Knee exercises include:  °• Leg Lifts - While your knee is still immobilized in a splint or cast, you can do straight leg raises. Lift the leg to 60 degrees, hold for 3 sec, and slowly lower the leg. Repeat 10-20 times 2-3 times daily. Perform this exercise against resistance later as your knee gets better.  °• Quad and Hamstring Sets - Tighten up the muscle on the front of the thigh (Quad) and hold for 5-10 sec. Repeat this 10-20 times hourly. Hamstring sets are done by pushing the foot backward against an  object and holding for 5-10 sec. Repeat as with quad sets.  °· Leg Slides: Lying on your back, slowly slide your foot toward your buttocks, bending your knee up off the floor (only go as far as is comfortable). Then slowly slide your foot back down until your leg is flat on the floor again. °· Angel Wings: Lying on your back spread your legs to the side as far apart as you can without causing discomfort.  °A rehabilitation program following serious knee injuries can speed recovery and prevent re-injury in the future due to weakened muscles. Contact your doctor or a physical therapist for more information on knee rehabilitation.  ° °IF YOU ARE TRANSFERRED TO A SKILLED REHAB FACILITY °If the patient is transferred to a skilled rehab facility following release from the hospital, a list of the current medications will be sent to the facility for the patient to continue.  When discharged from the skilled rehab facility, please have the facility set up the patient's Home Health Physical Therapy prior to being released. Also, the skilled facility will be responsible for providing the patient with their medications at time of release from the facility to include their pain medication, the muscle relaxants, and their blood thinner medication. If the patient is still at the rehab facility at time of the two week follow up appointment, the skilled rehab facility will also need to assist the patient in arranging follow up appointment in our office and any transportation needs. ° °MAKE SURE YOU:  °• Understand these instructions.  °• Get help right away if you are not doing well or get worse.  ° ° °Pick up stool softner and laxative for home use following surgery while on pain medications. °Do not submerge incision under water. °Please use good hand washing techniques while changing dressing each day. °May shower starting three days after surgery. °Please use a clean towel to pat the incision dry following showers. °Continue to  use ice for pain and swelling after surgery. °Do not use any lotions or creams on the incision until instructed by your surgeon. ° ° °_____________________________________________ ° °Information on my medicine - XARELTO® (Rivaroxaban) ° °This medication education was reviewed with me or my healthcare representative as part of my discharge preparation.  ° °Why was   Xarelto® prescribed for you? °Xarelto® was prescribed for you to reduce the risk of blood clots forming after orthopedic surgery. The medical term for these abnormal blood clots is venous thromboembolism (VTE). ° °What do you need to know about xarelto® ? °Take your Xarelto® ONCE DAILY at the same time every day. °You may take it either with or without food. ° °If you have difficulty swallowing the tablet whole, you may crush it and mix in applesauce just prior to taking your dose. ° °Take Xarelto® exactly as prescribed by your doctor and DO NOT stop taking Xarelto® without talking to the doctor who prescribed the medication.  Stopping without other VTE prevention medication to take the place of Xarelto® may increase your risk of developing a clot. ° °After discharge, you should have regular check-up appointments with your healthcare provider that is prescribing your Xarelto®.   ° °What do you do if you miss a dose? °If you miss a dose, take it as soon as you remember on the same day then continue your regularly scheduled once daily regimen the next day. Do not take two doses of Xarelto® on the same day.  ° °Important Safety Information °A possible side effect of Xarelto® is bleeding. You should call your healthcare provider right away if you experience any of the following: °? Bleeding from an injury or your nose that does not stop. °? Unusual colored urine (red or dark brown) or unusual colored stools (red or black). °? Unusual bruising for unknown reasons. °? A serious fall or if you hit your head (even if there is no bleeding). ° °Some medicines may  interact with Xarelto® and might increase your risk of bleeding while on Xarelto®. To help avoid this, consult your healthcare provider or pharmacist prior to using any new prescription or non-prescription medications, including herbals, vitamins, non-steroidal anti-inflammatory drugs (NSAIDs) and supplements. ° °This website has more information on Xarelto®: www.xarelto.com. ° ° °

## 2018-08-16 NOTE — Interval H&P Note (Signed)
History and Physical Interval Note:  08/16/2018 11:36 AM  Seth [name]  has presented today for surgery, with the diagnosis of left knee osteoarthritis  The various methods of treatment have been discussed with the patient and family. After consideration of risks, benefits and other options for treatment, the patient has consented to  Procedure(s) with comments: TOTAL KNEE ARTHROPLASTY (Left) - 94min as a surgical intervention .  The patient's history has been reviewed, patient examined, no change in status, stable for surgery.  I have reviewed the patient's chart and labs.  Questions were answered to the patient's satisfaction.     Pilar Plate Seth [name]

## 2018-08-16 NOTE — Anesthesia Preprocedure Evaluation (Addendum)
Anesthesia Evaluation  Patient identified by MRN, date of birth, ID band Patient awake    Reviewed: Allergy & Precautions, NPO status , Patient's Chart, lab work & pertinent test results  Airway Mallampati: II  TM Distance: >3 FB Neck ROM: Full    Dental no notable dental hx.    Pulmonary neg pulmonary ROS, former smoker,    Pulmonary exam normal breath sounds clear to auscultation       Cardiovascular hypertension, Pt. on medications Normal cardiovascular exam Rhythm:Regular Rate:Normal     Neuro/Psych negative neurological ROS  negative psych ROS   GI/Hepatic negative GI ROS, Neg liver ROS,   Endo/Other  negative endocrine ROS  Renal/GU negative Renal ROS  negative genitourinary   Musculoskeletal  (+) Arthritis , Osteoarthritis,    Abdominal   Peds negative pediatric ROS (+)  Hematology negative hematology ROS (+)   Anesthesia Other Findings   Reproductive/Obstetrics negative OB ROS                            Anesthesia Physical Anesthesia Plan  ASA: II  Anesthesia Plan: Spinal   Post-op Pain Management:  Regional for Post-op pain   Induction: Intravenous  PONV Risk Score and Plan: 2 and Ondansetron, Dexamethasone and Treatment may vary due to age or medical condition  Airway Management Planned: Simple Face Mask  Additional Equipment:   Intra-op Plan:   Post-operative Plan:   Informed Consent: I have reviewed the patients History and Physical, chart, labs and discussed the procedure including the risks, benefits and alternatives for the proposed anesthesia with the patient or authorized representative who has indicated his/her understanding and acceptance.   Dental advisory given  Plan Discussed with: CRNA and Surgeon  Anesthesia Plan Comments:         Anesthesia Quick Evaluation

## 2018-08-16 NOTE — Op Note (Signed)
OPERATIVE REPORT-TOTAL KNEE ARTHROPLASTY   Pre-operative diagnosis- Osteoarthritis  Left knee(s)  Post-operative diagnosis- Osteoarthritis Left knee(s)  Procedure-  Left  Total Knee Arthroplasty  Surgeon- Dione Plover. Heloise Gordan, MD  Assistant- Ardeen Jourdain, PA-C   Anesthesia-  Adductor canal block and spinal  EBL- 25 ml   Drains Hemovac  Tourniquet time- 39 minutes @ 270 mm Hg   Complications- None  Condition-PACU - hemodynamically stable.   Brief Clinical Note   Seth [name] is a 78 y.o. year old male with end stage OA of his left knee with progressively worsening pain and dysfunction. He has constant pain, with activity and at rest and significant functional deficits with difficulties even with ADLs. He has had extensive non-op management including analgesics, injections of cortisone and viscosupplements, and home exercise program, but remains in significant pain with significant dysfunction. Radiographs show bone on bone arthritis medial and patellofemoral. He presents now for left Total Knee Arthroplasty.     Procedure in detail---   The patient is brought into the operating room and positioned supine on the operating table. After successful administration of  Adductor canal block and spinal,   a tourniquet is placed high on the  Left thigh(s) and the lower extremity is prepped and draped in the usual sterile fashion. Time out is performed by the operating team and then the  Left lower extremity is wrapped in Esmarch, knee flexed and the tourniquet inflated to 300 mmHg.       A midline incision is made with a ten blade through the subcutaneous tissue to the level of the extensor mechanism. A fresh blade is used to make a medial parapatellar arthrotomy. Soft tissue over the proximal medial tibia is subperiosteally elevated to the joint line with a knife and into the semimembranosus bursa with a Cobb elevator. Soft tissue over the proximal lateral tibia is elevated with attention  being paid to avoiding the patellar tendon on the tibial tubercle. The patella is everted, knee flexed 90 degrees and the ACL and PCL are removed. Findings are bone on bone medial and patellofemoral with large global osteophytes.        The drill is used to create a starting hole in the distal femur and the canal is thoroughly irrigated with sterile saline to remove the fatty contents. The 5 degree Left  valgus alignment guide is placed into the femoral canal and the distal femoral cutting block is pinned to remove 10 mm off the distal femur. Resection is made with an oscillating saw.      The tibia is subluxed forward and the menisci are removed. The extramedullary alignment guide is placed referencing proximally at the medial aspect of the tibial tubercle and distally along the second metatarsal axis and tibial crest. The block is pinned to remove 64mm off the more deficient medial  side. Resection is made with an oscillating saw. Size 5is the most appropriate size for the tibia and the proximal tibia is prepared with the modular drill and keel punch for that size.      The femoral sizing guide is placed and size 6 is most appropriate. Rotation is marked off the epicondylar axis and confirmed by creating a rectangular flexion gap at 90 degrees. The size 6 cutting block is pinned in this rotation and the anterior, posterior and chamfer cuts are made with the oscillating saw. The intercondylar block is then placed and that cut is made.      Trial size 5 tibial component,  trial size 6 posterior stabilized femur and a 17.5  mm posterior stabilized rotating platform insert trial is placed. Full extension is achieved with excellent varus/valgus and anterior/posterior balance throughout full range of motion. The patella is everted and thickness measured to be 27  mm. Free hand resection is taken to 15 mm, a 41 template is placed, lug holes are drilled, trial patella is placed, and it tracks normally. Osteophytes are  removed off the posterior femur with the trial in place. All trials are removed and the cut bone surfaces prepared with pulsatile lavage. Cement is mixed and once ready for implantation, the size 5 tibial implant, size  6 posterior stabilized femoral component, and the size 41 patella are cemented in place and the patella is held with the clamp. The trial insert is placed and the knee held in full extension. The Exparel (20 ml mixed with 60 ml saline) is injected into the extensor mechanism, posterior capsule, medial and lateral gutters and subcutaneous tissues.  All extruded cement is removed and once the cement is hard the permanent 17.5 mm posterior stabilized rotating platform insert is placed into the tibial tray.      The wound is copiously irrigated with saline solution and the extensor mechanism closed over a hemovac drain with #1 V-loc suture. The tourniquet is released for a total tourniquet time of 39  minutes. Flexion against gravity is 140 degrees and the patella tracks normally. Subcutaneous tissue is closed with 2.0 vicryl and subcuticular with running 4.0 Monocryl. The incision is cleaned and dried and steri-strips and a bulky sterile dressing are applied. The limb is placed into a knee immobilizer and the patient is awakened and transported to recovery in stable condition.      Please note that a surgical assistant was a medical necessity for this procedure in order to perform it in a safe and expeditious manner. Surgical assistant was necessary to retract the ligaments and vital neurovascular structures to prevent injury to them and also necessary for proper positioning of the limb to allow for anatomic placement of the prosthesis.   Dione Plover Randie Tallarico, MD    08/16/2018, 3:10 PM

## 2018-08-16 NOTE — Anesthesia Procedure Notes (Signed)
Anesthesia Procedure Image    

## 2018-08-16 NOTE — Anesthesia Procedure Notes (Signed)
Anesthesia Regional Block: Adductor canal block   Pre-Anesthetic Checklist: ,, timeout performed, Correct Patient, Correct Site, Correct Laterality, Correct Procedure, Correct Position, site marked, Risks and benefits discussed,  Surgical consent,  Pre-op evaluation,  At surgeon's request and post-op pain management  Laterality: Left  Prep: chloraprep       Needles:  Injection technique: Single-shot  Needle Type: Echogenic Needle     Needle Length: 9cm      Additional Needles:   Procedures:,,,, ultrasound used (permanent image in chart),,,,  Narrative:  Start time: 08/16/2018 1:05 PM End time: 08/16/2018 1:12 PM Injection made incrementally with aspirations every 5 mL.  Performed by: Personally  Anesthesiologist: Myrtie Soman, MD  Additional Notes: Patient tolerated the procedure well without complications

## 2018-08-16 NOTE — Anesthesia Postprocedure Evaluation (Signed)
Anesthesia Post Note  Patient: Hadley Pen  Procedure(s) Performed: TOTAL KNEE ARTHROPLASTY (Left )     Patient location during evaluation: PACU Anesthesia Type: Spinal Level of consciousness: oriented and awake and alert Pain management: pain level controlled Vital Signs Assessment: post-procedure vital signs reviewed and stable Respiratory status: spontaneous breathing, respiratory function stable and patient connected to nasal cannula oxygen Cardiovascular status: blood pressure returned to baseline and stable Postop Assessment: no headache, no backache and no apparent nausea or vomiting Anesthetic complications: no    Last Vitals:  Vitals:   08/16/18 1645 08/16/18 1700  BP: 130/88 129/90  Pulse: 72 69  Resp: 15 18  Temp:  (!) 36.4 C  SpO2: 94% 95%    Last Pain:  Vitals:   08/16/18 1700  TempSrc:   PainSc: 0-No pain                 Ashtin Melichar S

## 2018-08-16 NOTE — Progress Notes (Signed)
Assisted Dr. Rose with left, ultrasound guided, adductor canal block. Side rails up, monitors on throughout procedure. See vital signs in flow sheet. Tolerated Procedure well.  

## 2018-08-17 ENCOUNTER — Other Ambulatory Visit: Payer: Self-pay

## 2018-08-17 ENCOUNTER — Encounter (HOSPITAL_COMMUNITY): Payer: Self-pay

## 2018-08-17 DIAGNOSIS — M1712 Unilateral primary osteoarthritis, left knee: Secondary | ICD-10-CM | POA: Diagnosis not present

## 2018-08-17 LAB — CBC
HEMATOCRIT: 43.1 % (ref 39.0–52.0)
Hemoglobin: 13.5 g/dL (ref 13.0–17.0)
MCH: 28 pg (ref 26.0–34.0)
MCHC: 31.3 g/dL (ref 30.0–36.0)
MCV: 89.4 fL (ref 80.0–100.0)
Platelets: 221 10*3/uL (ref 150–400)
RBC: 4.82 MIL/uL (ref 4.22–5.81)
RDW: 13.8 % (ref 11.5–15.5)
WBC: 10.4 10*3/uL (ref 4.0–10.5)
nRBC: 0 % (ref 0.0–0.2)

## 2018-08-17 LAB — BASIC METABOLIC PANEL
Anion gap: 9 (ref 5–15)
BUN: 10 mg/dL (ref 8–23)
CHLORIDE: 104 mmol/L (ref 98–111)
CO2: 22 mmol/L (ref 22–32)
Calcium: 8.7 mg/dL — ABNORMAL LOW (ref 8.9–10.3)
Creatinine, Ser: 1.18 mg/dL (ref 0.61–1.24)
GFR calc Af Amer: >60 mL/min (ref >60)
GFR calc non Af Amer: 60 mL/min — ABNORMAL LOW (ref >60)
Glucose, Bld: 151 mg/dL — ABNORMAL HIGH (ref 70–99)
Potassium: 4.4 mmol/L (ref 3.5–5.1)
Sodium: 135 mmol/L (ref 135–145)

## 2018-08-17 MED ORDER — LISINOPRIL 20 MG PO TABS
40.0000 mg | ORAL_TABLET | Freq: Every day | ORAL | Status: DC
  Administered 2018-08-17 – 2018-08-18 (×2): 40 mg via ORAL
  Filled 2018-08-17 (×2): qty 2

## 2018-08-17 MED ORDER — CEFAZOLIN SODIUM-DEXTROSE 2-4 GM/100ML-% IV SOLN
2.0000 g | Freq: Four times a day (QID) | INTRAVENOUS | Status: AC
  Administered 2018-08-17 (×2): 2 g via INTRAVENOUS
  Filled 2018-08-17 (×2): qty 100

## 2018-08-17 NOTE — Progress Notes (Signed)
   Subjective: 1 Day Post-Op Procedure(s) (LRB): TOTAL KNEE ARTHROPLASTY (Left) Patient reports pain as moderate.   Patient seen in rounds by Dr. Wynelle Link. Patient is well, and has had no acute complaints or problems. No issues overnight. Denies chest pain, SOB or calf pain. Foley catheter removed this AM. We will start therapy today.   Objective: Vital signs in last 24 hours: Temp:  [97.5 F (36.4 C)-98.7 F (37.1 C)] 97.7 F (36.5 C) (01/06 2357) Pulse Rate:  [69-124] 108 (01/07 0659) Resp:  [13-32] 18 (01/07 0659) BP: (99-150)/(70-135) 144/102 (01/07 0659) SpO2:  [94 %-100 %] 96 % (01/07 0659) Weight:  [117.5 kg] 117.5 kg (01/06 1119)  Intake/Output from previous day:  Intake/Output Summary (Last 24 hours) at 08/17/2018 0719 Last data filed at 08/17/2018 0547 Gross per 24 hour  Intake 2570.01 ml  Output 2225 ml  Net 345.01 ml    Labs: Recent Labs    08/17/18 0407  HGB 13.5   Recent Labs    08/17/18 0407  WBC 10.4  RBC 4.82  HCT 43.1  PLT 221   Recent Labs    08/17/18 0407  NA 135  K 4.4  CL 104  CO2 22  BUN 10  CREATININE 1.18  GLUCOSE 151*  CALCIUM 8.7*   Exam: General - Patient is Alert and Oriented Extremity - Neurologically intact Neurovascular intact Sensation intact distally Dorsiflexion/Plantar flexion intact Dressing - dressing C/D/I Motor Function - intact, moving foot and toes well on exam.   Past Medical History:  Diagnosis Date  . Arthritis   . Cancer (HCC)    hx of prostate cancer   . History of kidney stones   . Hypertension   . Pneumonia    06/2015     Assessment/Plan: 1 Day Post-Op Procedure(s) (LRB): TOTAL KNEE ARTHROPLASTY (Left) Active Problems:   OA (osteoarthritis) of knee  Estimated body mass index is 35.62 kg/m as calculated from the following:   Height as of this encounter: 5' 11.5" (1.816 m).   Weight as of this encounter: 117.5 kg. Advance diet Up with therapy  Anticipated LOS equal to or greater than 2  midnights due to - Age 77 and older with one or more of the following:  - Obesity  - Expected need for hospital services (PT, OT, Nursing) required for safe  discharge  - Anticipated need for postoperative skilled nursing care or inpatient rehab  - Active co-morbidities: None OR   - Unanticipated findings during/Post Surgery: None  - Patient is a high risk of re-admission due to: None    DVT Prophylaxis - Xarelto Weight bearing as tolerated. D/C O2 and pulse ox and try on room air. Hemovac pulled without difficulty, will begin therapy today.  Plan is to go Home after hospital stay. Plan for discharge tomorrow pending progress with therapy.  Theresa Duty, PA-C Orthopedic Surgery 08/17/2018, 7:19 AM

## 2018-08-17 NOTE — Care Management Note (Signed)
Case Management Note  Patient Details  Name: Seth [name] MRN: [id number] Date of Birth: [birth year]  Subjective/Objective:  Spoke with patient at bedside. Confirmed plan for OP PT, already arranged. Has RW and 3n1. [phone]                  Action/Plan:   Expected Discharge Date:  08/17/18               Expected Discharge Plan:  OP Rehab  In-House Referral:  NA  Discharge planning Services  CM Consult  Post Acute Care Choice:  NA Choice offered to:  Patient, Spouse  DME Arranged:  N/A DME Agency:  NA  HH Arranged:  NA HH Agency:  NA  Status of Service:  Completed, signed off  If discussed at [place] of Stay Meetings, dates discussed:    Additional Comments:  Guadalupe Maple, RN 08/17/2018, 3:55 PM

## 2018-08-17 NOTE — Care Management Obs Status (Signed)
[place] NOTIFICATION   Patient Details  Name: Seth [name] MRN: [id number] Date of Birth: 1941-04-23   Medicare Observation Status Notification Given:  Yes    Guadalupe Maple, RN 08/17/2018, 3:52 PM

## 2018-08-17 NOTE — Progress Notes (Signed)
Physical Therapy Treatment Patient Details Name: Seth [name] MRN: [id number] DOB: 07/30/1941 Today's Date: 08/17/2018    History of Present Illness 78 yo male s/p L TKA 08/16/17. Hx of R TKA 2017    PT Comments    Progressing with mobility. Mod assist needed to change gowns and underwear-pt soaked in urine. Will continue to follow and progress activity as tolerated.    Follow Up Recommendations  Follow surgeon's recommendation for DC plan and follow-up therapies     Equipment Recommendations  None recommended by PT    Recommendations for Other Services       Precautions / Restrictions Precautions Precautions: Fall Required Braces or Orthoses: Knee Immobilizer - Left Knee Immobilizer - Left: Discontinue once straight leg raise with < 10 degree lag Restrictions Weight Bearing Restrictions: No Other Position/Activity Restrictions: WBAT    Mobility  Bed Mobility Overal bed mobility: Needs Assistance Bed Mobility: Supine to Sit;Sit to Supine     Supine to sit: Min guard;HOB elevated Sit to supine: Min assist;HOB elevated   General bed mobility comments: VCs safety, technique. Assist for L LE. Increased time.   Transfers Overall transfer level: Needs assistance Equipment used: Rolling walker (2 wheeled) Transfers: Sit to/from Stand Sit to Stand: Min assist;From elevated surface         General transfer comment: Assist to rise, stabilize, control descent. VCs safety, technique, hand/LE placement.   Ambulation/Gait Ambulation/Gait assistance: Min assist Gait Distance (Feet): 65 Feet Assistive device: Rolling walker (2 wheeled) Gait Pattern/deviations: Step-through pattern;Trunk flexed     General Gait Details: Assist to stabilize intermittently. Improved gait pattern compared to this a.m   Stairs             Wheelchair Mobility    Modified Rankin (Stroke Patients Only)       Balance Overall balance assessment: Needs assistance          Standing balance support: Bilateral upper extremity supported Standing balance-Leahy Scale: Poor                              Cognition Arousal/Alertness: Awake/alert Behavior During Therapy: WFL for tasks assessed/performed Overall Cognitive Status: Within Functional Limits for tasks assessed                                        Exercises Total Joint Exercises Ankle Circles/Pumps: AROM;Both;10 reps;Supine Quad Sets: AROM;Both;10 reps;Supine Heel Slides: AAROM;Left;10 reps;Supine Hip ABduction/ADduction: AAROM;Left;10 reps;Supine Straight Leg Raises: AAROM;Left;10 reps;Supine Goniometric ROM: ~10-50 degrees    General Comments        Pertinent Vitals/Pain Pain Assessment: 0-10 Pain Score: 6  Pain Location: L knee Pain Descriptors / Indicators: Aching;Sore Pain Intervention(s): Monitored during session;Repositioned    Home Living                      Prior Function            PT Goals (current goals can now be found in the care plan section) Acute Rehab PT Goals Patient Stated Goal: home. regain PLOF.  PT Goal Formulation: With patient Time For Goal Achievement: 08/31/18 Potential to Achieve Goals: Good Progress towards PT goals: Progressing toward goals    Frequency    7X/week      PT Plan Current plan remains appropriate    Co-evaluation  AM-PAC PT "6 Clicks" Mobility   Outcome Measure  Help needed turning from your back to your side while in a flat bed without using bedrails?: A Little Help needed moving from lying on your back to sitting on the side of a flat bed without using bedrails?: A Little Help needed moving to and from a bed to a chair (including a wheelchair)?: A Little Help needed standing up from a chair using your arms (e.g., wheelchair or bedside chair)?: A Little Help needed to walk in hospital room?: A Little Help needed climbing 3-5 steps with a railing? : A Little 6 Click  Score: 18    End of Session Equipment Utilized During Treatment: Gait belt;Left knee immobilizer Activity Tolerance: Patient tolerated treatment well Patient left: in bed;with call bell/phone within reach;with bed alarm set;with family/visitor present   PT Visit Diagnosis: Difficulty in walking, not elsewhere classified (R26.2);Pain;Muscle weakness (generalized) (M62.81);Other abnormalities of gait and mobility (R26.89) Pain - Right/Left: Left Pain - part of body: Knee     Time: 6295-2841 PT Time Calculation (min) (ACUTE ONLY): 13 min  Charges:  $Gait Training: 8-22 mins                        Weston Anna, [place] Pager: [phone] Office: [phone]

## 2018-08-17 NOTE — Evaluation (Signed)
Physical Therapy Evaluation Patient Details Name: Seth [name] MRN: [id number] DOB: 1941-02-22 Today's Date: 08/17/2018   History of Present Illness  78 yo male s/p L TKA 08/16/17. Hx of R TKA 2017  Clinical Impression  On eval, pt was Min assist for mobility. He walked ~40 feet with a RW. Moderate pain with activity. Pt denied dizziness. Will follow and progress activity as tolerated. D/c plan is for home with OP PT f/u.     Follow Up Recommendations Follow surgeon's recommendation for DC plan and follow-up therapies    Equipment Recommendations  None recommended by PT    Recommendations for Other Services       Precautions / Restrictions Precautions Precautions: Fall Restrictions Weight Bearing Restrictions: No Other Position/Activity Restrictions: WBAT      Mobility  Bed Mobility Overal bed mobility: Needs Assistance Bed Mobility: Supine to Sit     Supine to sit: [facility] elevated;Min assist     General bed mobility comments: VCs safety, technique. Assist for L LE. Increased time.   Transfers Overall transfer level: Needs assistance Equipment used: Rolling walker (2 wheeled) Transfers: Sit to/from Stand Sit to Stand: Min assist;From elevated surface         General transfer comment: Assist to rise, stabilize, control descent. VCs safety, technique, hand/LE placement.   Ambulation/Gait Ambulation/Gait assistance: Min assist Gait Distance (Feet): 40 Feet Assistive device: Rolling walker (2 wheeled) Gait Pattern/deviations: Step-to pattern;Trunk flexed     General Gait Details: Mod VCs safety, technique, sequence. Assist to stabilize throughout distance.   Stairs            Wheelchair Mobility    Modified Rankin (Stroke Patients Only)       Balance Overall balance assessment: Needs assistance         Standing balance support: Bilateral upper extremity supported Standing balance-Leahy Scale: Poor                                Pertinent Vitals/Pain Pain Assessment: 0-10 Pain Score: 7  Pain Location: L knee Pain Descriptors / Indicators: Aching;Sore Pain Intervention(s): Monitored during session;Repositioned    Home Living Family/patient expects to be discharged to:: Private residence Living Arrangements: Spouse/significant other Available Help at Discharge: Family Type of Home: House Home Access: Stairs to enter Entrance Stairs-Rails: Right Entrance Stairs-Number of Steps: 2 Home Layout: One level Home Equipment: Environmental consultant - 2 wheels;Cane - single point      Prior Function Level of Independence: Independent               Hand Dominance        Extremity/Trunk Assessment   Upper Extremity Assessment Upper Extremity Assessment: Generalized weakness    Lower Extremity Assessment Lower Extremity Assessment: Generalized weakness    Cervical / Trunk Assessment Cervical / Trunk Assessment: Normal  Communication   Communication: No difficulties  Cognition Arousal/Alertness: Awake/alert Behavior During Therapy: WFL for tasks assessed/performed Overall Cognitive Status: Within Functional Limits for tasks assessed                                        General Comments      Exercises Total Joint Exercises Ankle Circles/Pumps: AROM;Both;10 reps;Supine Quad Sets: AROM;Both;10 reps;Supine Heel Slides: AAROM;Left;10 reps;Supine Hip ABduction/ADduction: AAROM;Left;10 reps;Supine Straight Leg Raises: AAROM;Left;10 reps;Supine Goniometric ROM: ~10-50 degrees   Assessment/Plan  PT Assessment Patient needs continued PT services  PT Problem List Decreased strength;Decreased range of motion;Decreased balance;Decreased mobility;Decreased activity tolerance;Pain;Decreased knowledge of use of DME       PT Treatment Interventions DME instruction;Gait training;Functional mobility training;Balance training;Therapeutic activities;Therapeutic exercise;Stair  training;Patient/family education    PT Goals (Current goals can be found in the Care Plan section)  Acute Rehab PT Goals Patient Stated Goal: home. regain PLOF.  PT Goal Formulation: With patient Time For Goal Achievement: 08/31/18 Potential to Achieve Goals: Good    Frequency 7X/week   Barriers to discharge        Co-evaluation               AM-PAC PT "6 Clicks" Mobility  Outcome Measure Help needed turning from your back to your side while in a flat bed without using bedrails?: A Little Help needed moving from lying on your back to sitting on the side of a flat bed without using bedrails?: A Little Help needed moving to and from a bed to a chair (including a wheelchair)?: A Little Help needed standing up from a chair using your arms (e.g., wheelchair or bedside chair)?: A Little Help needed to walk in hospital room?: A Little Help needed climbing 3-5 steps with a railing? : A Lot 6 Click Score: 17    End of Session Equipment Utilized During Treatment: Gait belt;Left knee immobilizer Activity Tolerance: Patient tolerated treatment well Patient left: in chair;with call bell/phone within reach   PT Visit Diagnosis: Difficulty in walking, not elsewhere classified (R26.2);Pain;Muscle weakness (generalized) (M62.81);Other abnormalities of gait and mobility (R26.89) Pain - Right/Left: Left Pain - part of body: Knee    Time: 1050-1120 PT Time Calculation (min) (ACUTE ONLY): 30 min   Charges:   PT Evaluation $PT Eval Low Complexity: 1 Low PT Treatments $Gait Training: 8-22 mins          Weston Anna, PT Acute Rehabilitation Services Pager: [phone] Office: [phone]

## 2018-08-18 DIAGNOSIS — M1712 Unilateral primary osteoarthritis, left knee: Secondary | ICD-10-CM | POA: Diagnosis not present

## 2018-08-18 LAB — CBC
HCT: 39 % (ref 39.0–52.0)
Hemoglobin: 12.5 g/dL — ABNORMAL LOW (ref 13.0–17.0)
MCH: 28 pg (ref 26.0–34.0)
MCHC: 32.1 g/dL (ref 30.0–36.0)
MCV: 87.4 fL (ref 80.0–100.0)
Platelets: 220 10*3/uL (ref 150–400)
RBC: 4.46 MIL/uL (ref 4.22–5.81)
RDW: 14.1 % (ref 11.5–15.5)
WBC: 17.1 10*3/uL — ABNORMAL HIGH (ref 4.0–10.5)
nRBC: 0 % (ref 0.0–0.2)

## 2018-08-18 LAB — BASIC METABOLIC PANEL
Anion gap: 7 (ref 5–15)
BUN: 14 mg/dL (ref 8–23)
CO2: 23 mmol/L (ref 22–32)
Calcium: 8.7 mg/dL — ABNORMAL LOW (ref 8.9–10.3)
Chloride: 105 mmol/L (ref 98–111)
Creatinine, Ser: 1.09 mg/dL (ref 0.61–1.24)
GFR calc Af Amer: >60 mL/min (ref >60)
GFR calc non Af Amer: >60 mL/min (ref >60)
Glucose, Bld: 128 mg/dL — ABNORMAL HIGH (ref 70–99)
Potassium: 4.4 mmol/L (ref 3.5–5.1)
SODIUM: 135 mmol/L (ref 135–145)

## 2018-08-18 MED ORDER — METHOCARBAMOL 500 MG PO TABS: 500.0000 mg | ORAL_TABLET | Freq: Four times a day (QID) | ORAL | 0 refills | Status: DC | PRN

## 2018-08-18 MED ORDER — OXYCODONE HCL 5 MG PO TABS: 5.0000 mg | ORAL_TABLET | Freq: Four times a day (QID) | ORAL | 0 refills | Status: AC | PRN

## 2018-08-18 MED ORDER — TRAMADOL HCL 50 MG PO TABS: 50.0000 mg | ORAL_TABLET | Freq: Four times a day (QID) | ORAL | 0 refills | Status: DC | PRN

## 2018-08-18 MED ORDER — RIVAROXABAN 10 MG PO TABS: 10.0000 mg | ORAL_TABLET | Freq: Every day | ORAL | 0 refills | Status: DC

## 2018-08-18 NOTE — Progress Notes (Signed)
Patient discharged to home w/ family. Given all belongings, instructions, prescriptions, equipment. Patient, spouse and son all verbalized understanding. Escorted to pov via w/c.

## 2018-08-18 NOTE — Progress Notes (Signed)
   Subjective: 2 Days Post-Op Procedure(s) (LRB): TOTAL KNEE ARTHROPLASTY (Left) Patient reports pain as mild.   Patient seen in rounds for Dr. Wynelle Link. Patient is well, and has had no acute complaints or problems other than pain in the left knee. Nurse reported a few episodes of urinary incontinence, and patient states that he has had this problem with past surgeries. Per nursing, patient has had some confusion, but patient appears appropriate on exam this morning. During exam today, patient was complaining of nausea related to food he ate this morning, but felt it was subsiding. Denies CP, SHOB, calf pain. Patient states he is ready to go home. Plan is to go Home after hospital stay.  Objective: Vital signs in last 24 hours: Temp:  [97.4 F (36.3 C)-98.2 F (36.8 C)] 97.4 F (36.3 C) (01/08 0411) Pulse Rate:  [98-123] 114 (01/08 0411) Resp:  [16-18] 16 (01/08 0411) BP: (116-146)/(84-101) 139/92 (01/08 0411) SpO2:  [95 %-100 %] 98 % (01/08 0411)  Intake/Output from previous day:  Intake/Output Summary (Last 24 hours) at 08/18/2018 0721 Last data filed at 08/18/2018 0540 Gross per 24 hour  Intake 717.23 ml  Output 550 ml  Net 167.23 ml    Intake/Output this shift: No intake/output data recorded.  Labs: Recent Labs    08/17/18 0407 08/18/18 0512  HGB 13.5 12.5*   Recent Labs    08/17/18 0407 08/18/18 0512  WBC 10.4 17.1*  RBC 4.82 4.46  HCT 43.1 39.0  PLT 221 220   Recent Labs    08/17/18 0407 08/18/18 0512  NA 135 135  K 4.4 4.4  CL 104 105  CO2 22 23  BUN 10 14  CREATININE 1.18 1.09  GLUCOSE 151* 128*  CALCIUM 8.7* 8.7*   No results for input(s): LABPT, INR in the last 72 hours.  Exam: General - Patient is Alert and Appropriate Extremity - Neurologically intact Sensation intact distally Intact pulses distally Dorsiflexion/Plantar flexion intact Dressing/Incision - clean, no drainage Motor Function - intact, moving foot and toes well on exam.   Past  Medical History:  Diagnosis Date  . Arthritis   . Cancer (HCC)    hx of prostate cancer   . History of kidney stones   . Hypertension   . Pneumonia    06/2015     Assessment/Plan: 2 Days Post-Op Procedure(s) (LRB): TOTAL KNEE ARTHROPLASTY (Left) Active Problems:   OA (osteoarthritis) of knee  Estimated body mass index is 35.62 kg/m as calculated from the following:   Height as of this encounter: 5' 11.5" (1.816 m).   Weight as of this encounter: 117.5 kg. Up with therapy D/C IV fluids  DVT Prophylaxis - Xarelto Weight-bearing as tolerated  Plan for discharge home today after one session of therapy as long as he continues to do well. Patient has had some minor issues, which are improving. Patient had some confusion per nursing, and so we have lowered his pain medication for discharge. Patient states he is ready to go home. Plan [facility] at [facility]. Follow up with Dr. Wynelle Link in the office in two weeks.   Seth Citron, PA-C Orthopedic Surgery 08/18/2018, 7:21 AM

## 2018-08-18 NOTE — Evaluation (Signed)
Occupational Therapy Evaluation Patient Details Name: Seth [name] MRN: [id number] DOB: [birth year] Today's Date: 08/18/2018    History of Present Illness 78 yo male s/p L TKA 08/16/17. Hx of R TKA 2017   Clinical Impression   OT eval and education complete.  Wife will provide 24/7 A per pt.      Follow Up Recommendations  Home health OT;Supervision/Assistance - 24 hour    Equipment Recommendations  None recommended by OT    Recommendations for Other Services       Precautions / Restrictions Precautions Precautions: Fall Required Braces or Orthoses: Knee Immobilizer - Left Knee Immobilizer - Left: Discontinue once straight leg raise with < 10 degree lag Restrictions Weight Bearing Restrictions: No Other Position/Activity Restrictions: WBAT      Mobility Bed Mobility               General bed mobility comments: oob in recliner  Transfers Overall transfer level: Needs assistance Equipment used: Rolling walker (2 wheeled) Transfers: Sit to/from [employer] Sit to Stand: Min assist Stand pivot transfers: Min assist       General transfer comment: Small assist to steady once standing from recliner. VCs safety, technique, hand/LE placement    Balance Overall balance assessment: Needs assistance           Standing balance-Leahy Scale: Poor Standing balance comment: Near LOB x 1 while standing at commode preparing to urinate. Pt had difficulty balancing and managing clothing (Placed OT consult for eval/tx/education)                           ADL either performed or assessed with clinical judgement   ADL Overall ADL's : Needs assistance/impaired     Grooming: Set up;Standing   Upper Body Bathing: Set up;Sitting   Lower Body Bathing: Moderate assistance;Sit to/from stand;Cueing for sequencing;Cueing for safety   Upper Body Dressing : Minimal assistance;Sitting   Lower Body Dressing: Moderate assistance;Sit to/from  stand;Cueing for sequencing;Cueing for safety   Toilet Transfer: Minimal assistance;RW;Ambulation;Comfort height toilet   Toileting- Clothing Manipulation and Hygiene: Minimal assistance;Sit to/from stand;Cueing for sequencing;Cueing for safety     Tub/Shower Transfer Details (indicate cue type and reason): pt will bird bath for first week and not get in tub. Educated on tub bench    General ADL Comments: wife will A as needed     Vision Patient Visual Report: No change from baseline       Perception     Praxis      Pertinent Vitals/Pain Pain Assessment: 0-10 Pain Score: 3  Pain Location: L knee Pain Descriptors / Indicators: Aching;Sore Pain Intervention(s): Limited activity within patient's tolerance;Repositioned        Extremity/Trunk Assessment         Cervical / Trunk Assessment Cervical / Trunk Assessment: Normal   Communication Communication Communication: No difficulties   Cognition Arousal/Alertness: Awake/alert Behavior During Therapy: WFL for tasks assessed/performed Overall Cognitive Status: Impaired/Different from baseline Area of Impairment: Safety/judgement;Memory                     Memory: Decreased short-term memory   Safety/Judgement: Decreased awareness of safety;Decreased awareness of deficits     General Comments: some mild confusion and safety awareness issues noted   General Comments               Home Living Family/patient expects to be discharged to:: Private residence Living Arrangements: Spouse/significant other  Available Help at Discharge: Family Type of Home: House Home Access: Stairs to enter [employer] of Steps: 2 Entrance Stairs-Rails: Right Home Layout: One level     Bathroom Shower/Tub: Tub/shower unit         Home Equipment: Environmental consultant - 2 wheels;Cane - single point          Prior Functioning/Environment Level of Independence: Independent                 OT Problem List:  Decreased strength;Decreased activity tolerance;Impaired balance (sitting and/or standing)      OT Treatment/Interventions:      OT Goals(Current goals can be found in the care plan section) Acute Rehab OT Goals Patient Stated Goal: home. regain PLOF.  OT Goal Formulation: With patient  OT Frequency:      AM-PAC OT "6 Clicks" Daily Activity     Outcome Measure Help from another person eating meals?: A Little Help from another person taking care of personal grooming?: A Little Help from another person toileting, which includes using toliet, bedpan, or urinal?: A Little Help from another person bathing (including washing, rinsing, drying)?: A Little Help from another person to put on and taking off regular upper body clothing?: None Help from another person to put on and taking off regular lower body clothing?: A Lot 6 Click Score: 18   End of Session Equipment Utilized During Treatment: Gait belt;Rolling walker Nurse Communication: Mobility status  Activity Tolerance: Patient tolerated treatment well Patient left: in chair;with call bell/phone within reach;with chair alarm set  OT Visit Diagnosis: Unsteadiness on feet (R26.81);Muscle weakness (generalized) (M62.81)                Time: 0240-9735 OT Time Calculation (min): 19 min Charges:  OT General Charges $OT Visit: 1 Visit OT Evaluation $OT Eval Moderate Complexity: 1 Mod  Kari Baars, [place] [phone] Office- [phone]     Yuvan Medinger, Edwena Felty D 08/18/2018, 1:39 PM

## 2018-08-18 NOTE — Progress Notes (Addendum)
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 Hx of R TKA 2017    PT Comments    Pt continues to participate well. Noted some mild confusion and safety awareness issues on today. Reviewed/practiced exercises, gait training, and stair training. Pt had difficulty safely performing ADLs so placed an OT consult for eval/tx/education. Pt stated he will have assistance from his wife and son. Highly recommend 24 hour supervision/assist. All education completed. Plan is for d/c home today.    Follow Up Recommendations  Follow surgeon's recommendation for DC plan and follow-up therapies 24 hour supervision/assist   Equipment Recommendations  None recommended by PT    Recommendations for Other Services       Precautions / Restrictions Precautions Precautions: Fall Required Braces or Orthoses: Knee Immobilizer - Left Knee Immobilizer - Left: Discontinue once straight leg raise with < 10 degree lag Restrictions Weight Bearing Restrictions: No Other Position/Activity Restrictions: WBAT    Mobility  Bed Mobility               General bed mobility comments: oob in recliner  Transfers Overall transfer level: Needs assistance Equipment used: Rolling walker (2 wheeled) Transfers: Sit to/from Stand Sit to Stand: Min assist         General transfer comment: Small assist to steady once standing from recliner. VCs safety, technique, hand/LE placement  Ambulation/Gait Ambulation/Gait assistance: Min assist Gait Distance (Feet): 100 Feet Assistive device: Rolling walker (2 wheeled) Gait Pattern/deviations: Step-to pattern;Step-through pattern;Decreased stride length;Trunk flexed     General Gait Details: Assist to stabilize intermittently. Cues for safety, posture, proper distance from RW. Pt tolerated distance well. No c/o dizziness.     Stairs Stairs: Yes Min Assist Stair Management: Forwards;With walker;Step to pattern Number of Stairs: 2(x2) General stair comments: VCs safety, technique, sequence. Assist to position walker and stabilize pt. Practiced x 2.    Wheelchair Mobility    Modified Rankin (Stroke Patients Only)       Balance Overall balance assessment: Needs assistance           Standing balance-Leahy Scale: Poor Standing balance comment: Near LOB x 1 while standing at commode preparing to urinate. Pt had difficulty balancing and managing clothing (Placed OT consult for eval/tx/education). Instructed pt to sit down instead of stand for safety reasons-pt agreeable.                            Cognition Arousal/Alertness: Awake/alert Behavior During Therapy: WFL for tasks assessed/performed Overall Cognitive Status: Impaired/Different from baseline Area of Impairment: Safety/judgement;Memory                     Memory: Decreased short-term memory   Safety/Judgement: Decreased awareness of safety;Decreased awareness of deficits     General Comments: some mild confusion and safety awareness issues noted      Exercises Total Joint Exercises Ankle Circles/Pumps: AROM;Both;10 reps;Seated Quad Sets: AROM;Both;10 reps;Seated Heel Slides: AAROM;Left;10 reps;Seated Hip ABduction/ADduction: AAROM;Left;10 reps;Seated Straight Leg Raises: AAROM;Left;10 reps;Supine Goniometric ROM: ~10-65 degrees    General Comments        Pertinent Vitals/Pain Pain Assessment: 0-10 Pain Score: 6  Pain Location: L knee Pain Descriptors / Indicators: Aching;Sore Pain Intervention(s): Monitored during session;Repositioned;Ice applied    Home Living  Prior Function            PT Goals (current goals can now be found in the care plan section) Progress towards PT goals: Progressing toward goals    Frequency    7X/week      PT Plan Current plan  remains appropriate    Co-evaluation              AM-PAC PT "6 Clicks" Mobility   Outcome Measure  Help needed turning from your back to your side while in a flat bed without using bedrails?: A Little Help needed moving from lying on your back to sitting on the side of a flat bed without using bedrails?: A Little Help needed moving to and from a bed to a chair (including a wheelchair)?: A Little Help needed standing up from a chair using your arms (e.g., wheelchair or bedside chair)?: A Little Help needed to walk in hospital room?: A Little Help needed climbing 3-5 steps with a railing? : A Little 6 Click Score: 18    End of Session Equipment Utilized During Treatment: Gait belt Activity Tolerance: Patient tolerated treatment well Patient left: in chair;with call bell/phone within reach;with chair alarm set   PT Visit Diagnosis: Difficulty in walking, not elsewhere classified (R26.2);Pain;Muscle weakness (generalized) (M62.81);Other abnormalities of gait and mobility (R26.89) Pain - Right/Left: Left Pain - part of body: Knee     Time: 5790-3833 PT Time Calculation (min) (ACUTE ONLY): 17 min  Charges:  $Gait Training: 8-22 mins                        Weston Anna, [place] Pager: [phone] Office: [phone]

## 2018-08-18 NOTE — Plan of Care (Signed)
  Problem: Clinical Measurements: Goal: Cardiovascular complication will be avoided Outcome: Progressing   Problem: Activity: Goal: Risk for activity intolerance will decrease Outcome: Progressing   Problem: Clinical Measurements: Goal: Will remain free from infection Outcome: Progressing   Problem: Elimination: Goal: Will not experience complications related to bowel motility Outcome: Progressing

## 2018-08-19 NOTE — Discharge Summary (Signed)
Physician Discharge Summary   Patient ID: Seth [name] MRN: [id number] DOB/AGE: 12-31-1940 78 y.o.  Admit date: 08/16/2018 Discharge date: 08/18/2018  Primary Diagnosis: Osteoarthritis, left knee  Admission Diagnoses:  Past Medical History:  Diagnosis Date  . Arthritis   . Cancer (HCC)    hx of prostate cancer   . History of kidney stones   . Hypertension   . Pneumonia    06/2015    Discharge Diagnoses:   Active Problems:   OA (osteoarthritis) of knee  Estimated body mass index is 35.62 kg/m as calculated from the following:   Height as of this encounter: 5' 11.5" (1.816 m).   Weight as of this encounter: 117.5 kg.  Procedure:  Procedure(s) (LRB): TOTAL KNEE ARTHROPLASTY (Left)   Consults: None  HPI:  Seth [name] is a 78 y.o. year old male with end stage OA of his left knee with progressively worsening pain and dysfunction. He has constant pain, with activity and at rest and significant functional deficits with difficulties even with ADLs. He has had extensive non-op management including analgesics, injections of cortisone and viscosupplements, and home exercise program, but remains in significant pain with significant dysfunction. Radiographs show bone on bone arthritis medial and patellofemoral. He presents now for left Total Knee Arthroplasty.   Laboratory Data: Admission on 08/16/2018, Discharged on 08/18/2018  Component Date Value Ref Range Status  . WBC 08/17/2018 10.4  4.0 - 10.5 K/uL Final  . RBC 08/17/2018 4.82  4.22 - 5.81 MIL/uL Final  . Hemoglobin 08/17/2018 13.5  13.0 - 17.0 g/dL Final  . HCT 08/17/2018 43.1  39.0 - 52.0 % Final  . MCV 08/17/2018 89.4  80.0 - 100.0 fL Final  . MCH 08/17/2018 28.0  26.0 - 34.0 pg Final  . MCHC 08/17/2018 31.3  30.0 - 36.0 g/dL Final  . RDW 08/17/2018 13.8  11.5 - 15.5 % Final  . Platelets 08/17/2018 221  150 - 400 K/uL Final  . nRBC 08/17/2018 0.0  0.0 - 0.2 % Final   Performed at [facility], [place] [address] [place], [place] [address]  . Sodium 08/17/2018 135  135 - 145 mmol/L Final  . Potassium 08/17/2018 4.4  3.5 - 5.1 mmol/L Final  . Chloride 08/17/2018 104  98 - 111 mmol/L Final  . CO2 08/17/2018 22  22 - 32 mmol/L Final  . Glucose, Bld 08/17/2018 151* 70 - 99 mg/dL Final  . BUN 08/17/2018 10  8 - 23 mg/dL Final  . Creatinine, Ser 08/17/2018 1.18  0.61 - 1.24 mg/dL Final  . Calcium 08/17/2018 8.7* 8.9 - 10.3 mg/dL Final  . GFR calc non Af Amer 08/17/2018 60* >60 mL/min Final  . GFR calc Af Amer 08/17/2018 >60  >60 mL/min Final  . Anion gap 08/17/2018 9  5 - 15 Final   Performed at [facility], [place] [address] [place], [place] [address]  . WBC 08/18/2018 17.1* 4.0 - 10.5 K/uL Final  . RBC 08/18/2018 4.46  4.22 - 5.81 MIL/uL Final  . Hemoglobin 08/18/2018 12.5* 13.0 - 17.0 g/dL Final  . HCT 08/18/2018 39.0  39.0 - 52.0 % Final  . MCV 08/18/2018 87.4  80.0 - 100.0 fL Final  . MCH 08/18/2018 28.0  26.0 - 34.0 pg Final  . MCHC 08/18/2018 32.1  30.0 - 36.0 g/dL Final  . RDW 08/18/2018 14.1  11.5 - 15.5 % Final  . Platelets 08/18/2018 220  150 - 400 K/uL Final  .  nRBC 08/18/2018 0.0  0.0 - 0.2 % Final   Performed at [place] [address] [place], [place] [address]  . Sodium 08/18/2018 135  135 - 145 mmol/L Final  . Potassium 08/18/2018 4.4  3.5 - 5.1 mmol/L Final  . Chloride 08/18/2018 105  98 - 111 mmol/L Final  . CO2 08/18/2018 23  22 - 32 mmol/L Final  . Glucose, Bld 08/18/2018 128* 70 - 99 mg/dL Final  . BUN 08/18/2018 14  8 - 23 mg/dL Final  . Creatinine, Ser 08/18/2018 1.09  0.61 - 1.24 mg/dL Final  . Calcium 08/18/2018 8.7* 8.9 - 10.3 mg/dL Final  . GFR calc non Af Amer 08/18/2018 >60  >60 mL/min Final  . GFR calc Af Amer 08/18/2018 >60  >60 mL/min Final  . Anion gap 08/18/2018 7  5 - 15 Final   Performed at [facility], [place] [address] [place], [place] [address]  Hospital Outpatient Visit on 08/12/2018   Component Date Value Ref Range Status  . aPTT 08/12/2018 38* 24 - 36 seconds Final   Comment:        IF BASELINE aPTT IS ELEVATED, SUGGEST PATIENT RISK ASSESSMENT BE USED TO DETERMINE APPROPRIATE ANTICOAGULANT THERAPY. Performed at [facility], [place] [address], [place], [place] [address]   . WBC 08/12/2018 4.6  4.0 - 10.5 K/uL Final  . RBC 08/12/2018 5.31  4.22 - 5.81 MIL/uL Final  . Hemoglobin 08/12/2018 14.8  13.0 - 17.0 g/dL Final  . HCT 08/12/2018 47.5  39.0 - 52.0 % Final  . MCV 08/12/2018 89.5  80.0 - 100.0 fL Final  . MCH 08/12/2018 27.9  26.0 - 34.0 pg Final  . MCHC 08/12/2018 31.2  30.0 - 36.0 g/dL Final  . RDW 08/12/2018 14.6  11.5 - 15.5 % Final  . Platelets 08/12/2018 240  150 - 400 K/uL Final  . nRBC 08/12/2018 0.0  0.0 - 0.2 % Final   Performed at [facility], [place] [address], [place], [place] [address]  . Sodium 08/12/2018 139  135 - 145 mmol/L Final  . Potassium 08/12/2018 4.6  3.5 - 5.1 mmol/L Final  . Chloride 08/12/2018 105  98 - 111 mmol/L Final  . CO2 08/12/2018 26  22 - 32 mmol/L Final  . Glucose, Bld 08/12/2018 101* 70 - 99 mg/dL Final  . BUN 08/12/2018 11  8 - 23 mg/dL Final  . Creatinine, Ser 08/12/2018 1.25* 0.61 - 1.24 mg/dL Final  . Calcium 08/12/2018 9.3  8.9 - 10.3 mg/dL Final  . Total Protein 08/12/2018 7.9  6.5 - 8.1 g/dL Final  . Albumin 08/12/2018 4.3  3.5 - 5.0 g/dL Final  . AST 08/12/2018 25  15 - 41 U/L Final  . ALT 08/12/2018 20  0 - 44 U/L Final  . Alkaline Phosphatase 08/12/2018 96  38 - 126 U/L Final  . Total Bilirubin 08/12/2018 0.7  0.3 - 1.2 mg/dL Final  . GFR calc non Af Amer 08/12/2018 56* >60 mL/min Final  . GFR calc Af Amer 08/12/2018 >60  >60 mL/min Final  . Anion gap 08/12/2018 8  5 - 15 Final   Performed at [facility], [place] [address] [place], [place] [address]  . Prothrombin Time 08/12/2018 12.6  11.4 - 15.2 seconds Final  . INR 08/12/2018 0.95   Final   Performed  at [facility], [place] [address], [place], [place] [address]  . ABO/RH(D) 08/12/2018 Jenetta Downer  POS   Final  . Antibody Screen 08/12/2018 NEG   Final  . Sample Expiration 08/12/2018 08/19/2018   Final  . Extend sample reason 08/12/2018    Final                   Value:NO TRANSFUSIONS OR PREGNANCY IN THE PAST 3 MONTHS Performed at [facility], [place] [address], [place], [place] [address]   . MRSA, PCR 08/12/2018 NEGATIVE  NEGATIVE Final  . Staphylococcus aureus 08/12/2018 NEGATIVE  NEGATIVE Final   Comment: (NOTE) The Xpert SA Assay (FDA approved for NASAL specimens in patients 43 years of age and older), is one component of a comprehensive surveillance program. It is not intended to diagnose infection nor to guide or monitor treatment. Performed at [facility], [place] [address], [place], [place] [address]      X-Rays:No results found.  EKG: Orders placed or performed during the hospital encounter of 11/08/17  . ED EKG  . ED EKG  . EKG 12-Lead  . EKG 12-Lead  . EKG     Hospital Course: HASTEN SWEITZER is a 78 y.o. who was admitted to [facility]. They were brought to the operating room on 08/16/2018 and underwent Procedure(s): TOTAL KNEE ARTHROPLASTY.  Patient tolerated the procedure well and was later transferred to the recovery room and then to the orthopaedic floor for postoperative care. They were given PO and IV analgesics for pain control following their surgery. They were given 24 hours of postoperative antibiotics of  Anti-infectives (From admission, onward)   Start     Dose/Rate Route Frequency Ordered Stop   08/17/18 0200  ceFAZolin (ANCEF) IVPB 2g/100 mL premix     2 g 200 mL/hr over 30 Minutes Intravenous Every 6 hours 08/17/18 0109 08/17/18 0839   08/17/18 0030  ceFAZolin (ANCEF) IVPB 2g/100 mL premix  Status:  Discontinued     2 g 200 mL/hr over 30 Minutes Intravenous Every 6 hours 08/16/18 1745 08/17/18 0109    08/16/18 2025  ceFAZolin (ANCEF) 2-4 GM/100ML-% IVPB    Note to Pharmacy:  Orie Fisherman   : cabinet override      08/16/18 2025 08/16/18 2033   08/16/18 2020  ceFAZolin (ANCEF) 2-4 GM/100ML-% IVPB    Note to Pharmacy:  Orie Fisherman   : cabinet override      08/16/18 2020 08/17/18 0829   08/16/18 1115  ceFAZolin (ANCEF) IVPB 2g/100 mL premix     2 g 200 mL/hr over 30 Minutes Intravenous On call to O.R. 08/16/18 1101 08/16/18 1410     and started on DVT prophylaxis in the form of Xarelto.   PT and OT were ordered for total joint protocol. Discharge planning consulted to help with postop disposition and equipment needs. On the night of surgery patient had a few episodes of urinary incontinence, which he has had with previous surgeries. Per nursing, patient had some confusion, which was resolved by the morning. They started to get up OOB with therapy on POD #1. Hemovac drain was pulled without difficulty on day one. Continued to work with therapy into POD #2. Pt was seen during rounds on day two and was having some nausea related to his morning meal that resolved during the exam. He was ready to go home pending progress with therapy. Dressing was changed and the incision was without redness or drainage. Pt worked with therapy for one additional session and was meeting their goals. He was discharged  to home later that day in stable condition.  Diet: Regular diet Activity: WBAT Follow-up: in 2 weeks Disposition: Home Discharged Condition: good   Discharge Instructions    Call MD / Call 911   Complete by:  As directed    If you experience chest pain or shortness of breath, CALL 911 and be transported to the hospital emergency room.  If you develope a fever above 101 F, pus (white drainage) or increased drainage or redness at the wound, or calf pain, call your surgeon's office.   Change dressing   Complete by:  As directed    Change dressing on Wednesday, then change the dressing daily with  sterile 4 x 4 inch gauze dressing and apply TED hose.   Constipation Prevention   Complete by:  As directed    Drink plenty of fluids.  Prune juice may be helpful.  You may use a stool softener, such as Colace (over the counter) 100 mg twice a day.  Use MiraLax (over the counter) for constipation as needed.   Diet - low sodium heart healthy   Complete by:  As directed    Discharge instructions   Complete by:  As directed    Dr. Gaynelle Arabian Total Joint Specialist [facility] [address], [place], [place] [address] [phone]  TOTAL KNEE REPLACEMENT POSTOPERATIVE DIRECTIONS  Knee Rehabilitation, Guidelines Following Surgery  Results after knee surgery are often greatly improved when you follow the exercise, range of motion and muscle strengthening exercises prescribed by your doctor. Safety measures are also important to protect the knee from further injury. Any time any of these exercises cause you to have increased pain or swelling in your knee joint, decrease the amount until you are comfortable again and slowly increase them. If you have problems or questions, call your caregiver or physical therapist for advice.   HOME CARE INSTRUCTIONS  Remove items at home which could result in a fall. This includes throw rugs or furniture in walking pathways.  ICE to the affected knee every three hours for 30 minutes at a time and then as needed for pain and swelling.  Continue to use ice on the knee for pain and swelling from surgery. You may notice swelling that will progress down to the foot and ankle.  This is normal after surgery.  Elevate the leg when you are not up walking on it.   Continue to use the breathing machine which will help keep your temperature down.  It is common for your temperature to cycle up and down following surgery, especially at night when you are not up moving around and exerting yourself.  The breathing machine keeps your lungs expanded and your  temperature down. Do not place pillow under knee, focus on keeping the knee straight while resting   DIET You may resume your previous home diet once your are discharged from the hospital.  DRESSING / WOUND CARE / SHOWERING You may shower 3 days after surgery, but keep the wounds dry during showering.  You may use an occlusive plastic wrap (Press'n Seal for example), NO SOAKING/SUBMERGING IN THE BATHTUB.  If the bandage gets wet, change with a clean dry gauze.  If the incision gets wet, pat the wound dry with a clean towel. You may start showering once you are discharged home but do not submerge the incision under water. Just pat the incision dry and apply a dry gauze dressing on daily. Change the surgical dressing daily and reapply  a dry dressing each time.  ACTIVITY Walk with your walker as instructed. Use walker as long as suggested by your caregivers. Avoid periods of inactivity such as sitting longer than an hour when not asleep. This helps prevent blood clots.  You may resume a sexual relationship in one month or when given the OK by your doctor.  You may return to work once you are cleared by your doctor.  Do not drive a car for 6 weeks or until released by you surgeon.  Do not drive while taking narcotics.  WEIGHT BEARING Weight bearing as tolerated with assist device (walker, cane, etc) as directed, use it as long as suggested by your surgeon or therapist, typically at least 4-6 weeks.  POSTOPERATIVE CONSTIPATION PROTOCOL Constipation - defined medically as fewer than three stools per week and severe constipation as less than one stool per week.  One of the most common issues patients have following surgery is constipation.  Even if you have a regular bowel pattern at home, your normal regimen is likely to be disrupted due to multiple reasons following surgery.  Combination of anesthesia, postoperative narcotics, change in appetite and fluid intake all can affect your bowels.  In  order to avoid complications following surgery, here are some recommendations in order to help you during your recovery period.  Colace (docusate) - Pick up an over-the-counter form of Colace or another stool softener and take twice a day as long as you are requiring postoperative pain medications.  Take with a full glass of water daily.  If you experience loose stools or diarrhea, hold the colace until you stool forms back up.  If your symptoms do not get better within 1 week or if they get worse, check with your doctor.  Dulcolax (bisacodyl) - Pick up over-the-counter and take as directed by the product packaging as needed to assist with the movement of your bowels.  Take with a full glass of water.  Use this product as needed if not relieved by Colace only.   MiraLax (polyethylene glycol) - Pick up over-the-counter to have on hand.  MiraLax is a solution that will increase the amount of water in your bowels to assist with bowel movements.  Take as directed and can mix with a glass of water, juice, soda, coffee, or tea.  Take if you go more than two days without a movement. Do not use MiraLax more than once per day. Call your doctor if you are still constipated or irregular after using this medication for 7 days in a row.  If you continue to have problems with postoperative constipation, please contact the office for further assistance and recommendations.  If you experience "the worst abdominal pain ever" or develop nausea or vomiting, please contact the office immediatly for further recommendations for treatment.  ITCHING  If you experience itching with your medications, try taking only a single pain pill, or even half a pain pill at a time.  You can also use Benadryl over the counter for itching or also to help with sleep.   TED HOSE STOCKINGS Wear the elastic stockings on both legs for three weeks following surgery during the day but you may remove then at night for sleeping.  MEDICATIONS See  your medication summary on the "After Visit Summary" that the nursing staff will review with you prior to discharge.  You may have some home medications which will be placed on hold until you complete the course of blood thinner medication.  It  is important for you to complete the blood thinner medication as prescribed by your surgeon.  Continue your approved medications as instructed at time of discharge.  PRECAUTIONS If you experience chest pain or shortness of breath - call 911 immediately for transfer to the hospital emergency department.  If you develop a fever greater that 101 F, purulent drainage from wound, increased redness or drainage from wound, foul odor from the wound/dressing, or calf pain - CONTACT YOUR SURGEON.                                                   FOLLOW-UP APPOINTMENTS Make sure you keep all of your appointments after your operation with your surgeon and caregivers. You should call the office at the above phone number and make an appointment for approximately two weeks after the date of your surgery or on the date instructed by your surgeon outlined in the "After Visit Summary".   RANGE OF MOTION AND STRENGTHENING EXERCISES  Rehabilitation of the knee is important following a knee injury or an operation. After just a few days of immobilization, the muscles of the thigh which control the knee become weakened and shrink (atrophy). Knee exercises are designed to build up the tone and strength of the thigh muscles and to improve knee motion. Often times heat used for twenty to thirty minutes before working out will loosen up your tissues and help with improving the range of motion but do not use heat for the first two weeks following surgery. These exercises can be done on a training (exercise) mat, on the floor, on a table or on a bed. Use what ever works the best and is most comfortable for you Knee exercises include:  Leg Lifts - While your knee is still immobilized in a  splint or cast, you can do straight leg raises. Lift the leg to 60 degrees, hold for 3 sec, and slowly lower the leg. Repeat 10-20 times 2-3 times daily. Perform this exercise against resistance later as your knee gets better.  Quad and Hamstring Sets - Tighten up the muscle on the front of the thigh (Quad) and hold for 5-10 sec. Repeat this 10-20 times hourly. Hamstring sets are done by pushing the foot backward against an object and holding for 5-10 sec. Repeat as with quad sets.  Leg Slides: Lying on your back, slowly slide your foot toward your buttocks, bending your knee up off the floor (only go as far as is comfortable). Then slowly slide your foot back down until your leg is flat on the floor again. Angel Wings: Lying on your back spread your legs to the side as far apart as you can without causing discomfort.  A rehabilitation program following serious knee injuries can speed recovery and prevent re-injury in the future due to weakened muscles. Contact your doctor or a physical therapist for more information on knee rehabilitation.   IF YOU ARE TRANSFERRED TO A SKILLED REHAB FACILITY If the patient is transferred to a skilled rehab facility following release from the hospital, a list of the current medications will be sent to the facility for the patient to continue.  When discharged from the skilled rehab facility, please have the facility set up the patient's [place] prior to being released. Also, the skilled facility will be responsible for providing the  patient with their medications at time of release from the facility to include their pain medication, the muscle relaxants, and their blood thinner medication. If the patient is still at the rehab facility at time of the two week follow up appointment, the skilled rehab facility will also need to assist the patient in arranging follow up appointment in our office and any transportation needs.  MAKE SURE YOU:  Understand  these instructions.  Get help right away if you are not doing well or get worse.    Pick up stool softner and laxative for home use following surgery while on pain medications. Do not submerge incision under water. Please use good hand washing techniques while changing dressing each day. May shower starting three days after surgery. Please use a clean towel to pat the incision dry following showers. Continue to use ice for pain and swelling after surgery. Do not use any lotions or creams on the incision until instructed by your surgeon.   Do not put a pillow under the knee. Place it under the heel.   Complete by:  As directed    Driving restrictions   Complete by:  As directed    No driving for two weeks   TED hose   Complete by:  As directed    Use stockings (TED hose) for three weeks on both leg(s).  You may remove them at night for sleeping.   Weight bearing as tolerated   Complete by:  As directed      Allergies as of 08/18/2018   No Known Allergies     Medication List    STOP taking these medications   cyclobenzaprine 10 MG tablet Commonly known as:  FLEXERIL   HYDROmorphone 2 MG tablet Commonly known as:  DILAUDID     TAKE these medications   albuterol 108 (90 Base) MCG/ACT inhaler Commonly known as:  PROVENTIL HFA;VENTOLIN HFA Inhale 1-2 puffs into the lungs every 6 (six) hours as needed for wheezing or shortness of breath.   amLODipine 10 MG tablet Commonly known as:  NORVASC Take 10 mg by mouth every morning.   atorvastatin 40 MG tablet Commonly known as:  LIPITOR Take 40 mg by mouth every morning.   augmented betamethasone dipropionate 0.05 % ointment Commonly known as:  DIPROLENE Apply topically 2 (two) times daily.   fluocinonide ointment 0.05 % Commonly known as:  LIDEX Apply 1 application topically 2 (two) times daily.   methocarbamol 500 MG tablet Commonly known as:  ROBAXIN Take 1 tablet (500 mg total) by mouth every 6 (six) hours as needed  for muscle spasms.   oxyCODONE 5 MG immediate release tablet Commonly known as:  Oxy IR/ROXICODONE Take 1 tablet (5 mg total) by mouth every 6 (six) hours as needed for up to 7 days for severe pain. Call office at [phone] for refills.   predniSONE 10 MG tablet Commonly known as:  DELTASONE Take 10 mg by mouth as needed (for skin rash).   quinapril 40 MG tablet Commonly known as:  ACCUPRIL Take 40 mg by mouth every morning.   rivaroxaban 10 MG Tabs tablet Commonly known as:  XARELTO Take 1 tablet (10 mg total) by mouth daily with breakfast for 19 days. Then take one 81 mg aspirin once a day for three weeks. Then discontinue aspirin. What changed:  additional instructions   SIMBRINZA 1-0.2 % Susp Generic drug:  Brinzolamide-Brimonidine Place 1 drop into both eyes 3 (three) times daily.   traMADol 50 MG tablet  Commonly known as:  ULTRAM Take 1-2 tablets (50-100 mg total) by mouth every 6 (six) hours as needed for moderate pain. What changed:  reasons to take this   TRAVATAN Z 0.004 % Soln ophthalmic solution Generic drug:  Travoprost (BAK Free) Place 1 drop into both eyes at bedtime.            Discharge Care Instructions  (From admission, onward)         Start     Ordered   08/17/18 0000  Weight bearing as tolerated     08/17/18 0722   08/17/18 0000  Change dressing    Comments:  Change dressing on Wednesday, then change the dressing daily with sterile 4 x 4 inch gauze dressing and apply TED hose.   08/17/18 2778         Follow-up Information    Gaynelle Arabian, MD. Schedule an appointment as soon as possible for a visit on 08/31/2018.   Specialty:  Orthopedic Surgery Contact information: [address] [place] [address] [phone]           Signed: Griffith Citron, PA-C Orthopedic Surgery 08/19/2018, 7:22 AM

## 2018-08-20 ENCOUNTER — Encounter: Payer: Self-pay | Admitting: Physical Therapy

## 2018-08-20 ENCOUNTER — Ambulatory Visit: Payer: Medicare Other | Attending: Student | Admitting: Physical Therapy

## 2018-08-20 ENCOUNTER — Other Ambulatory Visit: Payer: Self-pay

## 2018-08-20 DIAGNOSIS — R262 Difficulty in walking, not elsewhere classified: Secondary | ICD-10-CM | POA: Insufficient documentation

## 2018-08-20 DIAGNOSIS — M6281 Muscle weakness (generalized): Secondary | ICD-10-CM | POA: Diagnosis present

## 2018-08-20 DIAGNOSIS — M25662 Stiffness of left knee, not elsewhere classified: Secondary | ICD-10-CM | POA: Insufficient documentation

## 2018-08-20 DIAGNOSIS — M25562 Pain in left knee: Secondary | ICD-10-CM | POA: Diagnosis not present

## 2018-08-20 DIAGNOSIS — R600 Localized edema: Secondary | ICD-10-CM | POA: Diagnosis present

## 2018-08-20 NOTE — Therapy (Addendum)
[facility] [address]  [place], [place], [address] Phone: [phone]   Fax:  [phone]  Physical Therapy Evaluation  Patient Details  Name: Seth [name] MRN: [id number] Date of Birth: [birth year] Referring Provider (PT): Gaynelle Arabian, MD   Encounter Date: 08/20/2018  PT End of Session - 08/20/18 1157    Visit Number  1    Number of Visits  13    Date for PT Re-Evaluation  09/17/18    Authorization Type  [employer] Medicare    PT Start Time  1104    PT Stop Time  1151    PT Time Calculation (min)  47 min    Activity Tolerance  Patient tolerated treatment well;Patient limited by pain    Behavior During Therapy  [facility] for tasks assessed/performed       Past Medical History:  Diagnosis Date  . Arthritis   . Cancer (HCC)    hx of prostate cancer   . History of kidney stones   . Hypertension   . Pneumonia    06/2015     Past Surgical History:  Procedure Laterality Date  . PROSTATECTOMY    . 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   Procedure: TOTAL KNEE ARTHROPLASTY;  Surgeon: Gaynelle Arabian, MD;  Location: [facility];  Service: Orthopedics;  Laterality: Left;  60min    There were no vitals filed for this visit.   Subjective Assessment - 08/20/18 1109    Subjective  Patient reports L TKA on 08/16/18. Currently ambulating with rolling walker, no AD at PLOF. Reports trouble with bending L knee, walking, standing. No known surgical precautions per patient. Reports improved pain levels since surgery- is taking pain meds. Pain is located along incision. Denies warmth, redness, calf pain Patient is retired and does not participate in "strenuous activities". MD recommended compression sock for swelling. Has been icing L knee for edema.     Pertinent History  HTN, hx of kidney stones, hx of prostate CA with prostatectomy, R TKA    Limitations  Sitting;Lifting;Standing;Walking;House hold activities    How long can you sit comfortably?  30 min    How long can you stand comfortably?  30 min    How long can you walk comfortably?  20 min    Diagnostic tests  none    Patient Stated Goals  "get better in general"    Currently in Pain?  Yes    Pain Score  0-No pain    Pain Location  Knee    Pain Orientation  Left    Pain Descriptors / Indicators  Aching    Pain Type  Acute pain;Surgical pain    Aggravating Factors   sudden movements, prolonged standing    Pain Relieving Factors  pain pills         [facility] PT Assessment - 08/20/18 1125      Assessment   Medical Diagnosis  OA of L knee    Referring Provider (PT)  Gaynelle Arabian, MD    Onset Date/Surgical Date  08/16/18    Next MD Visit  08/30/18    Prior Therapy  Yes- R TKA      Precautions   Precautions  --   TKA precautions, hx prostate CA in remisison     Restrictions  Weight Bearing Restrictions  --   WBAT     Balance Screen   Has the patient fallen in the past 6 months  Yes    How many times?  2   knee buckling   Has the patient had a decrease in activity level because of a fear of falling?   Yes    Is the patient reluctant to leave their home because of a fear of falling?   Yes      [place] residence    Living Arrangements  Spouse/significant other    Available Help at Discharge  Family    Type of [place] to enter    Entrance Stairs-Number of Steps  2    [place]  One level    [place] - 2 wheels;[place] - single point      Prior Function   Level of Independence  Independent    Vocation  Retired    Leisure  horseback riding, fishing      Cognition   Overall Cognitive Status  Within Functional Limits for tasks assessed       Observation/Other Assessments   Observations  L knee incision covered with gauze- mild bleeding mid incision; no odor or warmth    Focus on Therapeutic Outcomes (FOTO)   Knee: 49 (51% limited, 33% predicted)      Observation/Other Assessments-Edema    Edema  --   L LE edema with indent in L lower leg d/t compression sock     Sensation   Light Touch  Appears Intact      Coordination   Gross Motor Movements are Fluid and Coordinated  Yes      Posture/Postural Control   Posture/Postural Control  Postural limitations    Postural Limitations  Rounded Shoulders;Forward head;Weight shift left      ROM / Strength   AROM / PROM / Strength  AROM;PROM;Strength      AROM   AROM Assessment Site  Knee    Right/Left Knee  Right;Left    Right Knee Extension  0    Right Knee Flexion  115    Left Knee Extension  10    Left Knee Flexion  58      PROM   PROM Assessment Site  Knee    Right/Left Knee  Right;Left    Right Knee Extension  0    Right Knee Flexion  121    Left Knee Extension  10    Left Knee Flexion  72   pain     Strength   Strength Assessment Site  Hip;Knee;Ankle    Right/Left Hip  Right;Left    Right Hip Flexion  4+/5    Right Hip ABduction  4/5    Right Hip ADduction  4/5    Left Hip Flexion  2/5    Left Hip ABduction  3+/5    Left Hip ADduction  3+/5    Right/Left Knee  Right;Left    Right Knee Flexion  4/5    Right Knee Extension  4+/5    Left Knee Flexion  2/5    Left Knee Extension  2/5    Right/Left Ankle  Right;Left    Right Ankle Dorsiflexion  4/5    Right Ankle Plantar Flexion  4/5    Left Ankle Dorsiflexion  4/5    Left Ankle  Plantar Flexion  4/5      Palpation   Patella mobility  unable to assess d/t dressings    Palpation comment  TTP medial and lateral to joint; no calf tenderness      Ambulation/Gait   Assistive device  Rolling walker    Gait Pattern  Step-to pattern;Step-through pattern;Decreased hip/knee flexion - left;Decreased weight shift  to left;Decreased stance time - left;Decreased step length - right;Trunk flexed   severe anterior trunk lean   Ambulation Surface  Level;Indoor    Gait velocity  decreased                Objective measurements completed on examination: See above findings.              PT Education - 08/20/18 1157    Education Details  prognosis, POC, HEP    Person(s) Educated  Patient    Methods  Explanation;Demonstration;Tactile cues;Verbal cues;Handout    Comprehension  Verbalized understanding;Returned demonstration       PT Short Term Goals - 08/20/18 1205      PT SHORT TERM GOAL #1   Title  Patient to be independent with HEP.    Time  2    Period  Weeks    Status  New    Target Date  09/03/18        PT Long Term Goals - 08/20/18 1206      PT LONG TERM GOAL #1   Title  Patient to be independent with advanced HEP.    Time  4    Period  Weeks    Status  New    Target Date  09/17/18      PT LONG TERM GOAL #2   Title  Patient to demonstrate L kneee AROM/PROM pain-free and symmetrical to opposite LE.     Time  4    Period  Weeks    Status  New    Target Date  09/17/18      PT LONG TERM GOAL #3   Title  Patient to demonstrate >=4+/5 strength in B LEs.     Time  4    Period  Weeks    Status  New    Target Date  09/17/18      PT LONG TERM GOAL #4   Title  Patient to score <14 sec on TUG with LRAD.    Time  4    Period  Weeks    Status  New    Target Date  09/17/18      PT LONG TERM GOAL #5   Title  Patient to demonstrate symmetrical step length, weight shift, and knee flexion with ambulation with LRAD.    Time  4    Period  Weeks    Status  New    Target Date  09/17/18             Plan - 08/20/18 1158    Clinical Impression Statement  Patient is a 78y/o M presenting to OPPT with wife c/o L knee pain s/p L TKA on 08/16/18. Patient unaware of surgical precautions- educated on these thoroughly. Now ambulating with rolling walker, however not  using AD at PLOF. Reports trouble with bending L knee, walking, standing. Patient today with limited and painful L knee ROM, decreased strength, and gait deviations. Reports that he changed out his dressing- slight bleeding at mid incision visible however without odor or warmth. Patient wearing compression sock that created indent at mid calf-  advised patient to discontinue use of this sock until he finds one with better fit. Adjusted walker height to proper fit.  Educated patient on gentle ROM and strengthening HEP- patient reported understanding. Would benefit from skilled PT services 3x/week for 4 weeks to address aforementioned impairments.     Clinical Presentation  Stable    Clinical Decision Making  Low    Rehab Potential  Good    Clinical Impairments Affecting Rehab Potential  HTN, hx of kidney stones, hx of prostate CA with prostatectomy, R TKA    PT Frequency  3x / week    PT Duration  4 weeks    PT Treatment/Interventions  ADLs/Self Care Home Management;Cryotherapy;Electrical Stimulation;Functional mobility training;Stair training;Gait training;DME Instruction;Ultrasound;Moist Heat;Therapeutic activities;Therapeutic exercise;Balance training;Neuromuscular re-education;Patient/family education;Orthotic Fit/Training;Passive range of motion;Scar mobilization;Manual techniques;Dry needling;Energy conservation;Splinting;Vasopneumatic Device;Taping    PT Next Visit Plan  reassess HEP; TUG    Consulted and Agree with Plan of Care  Patient       Patient will benefit from skilled therapeutic intervention in order to improve the following deficits and impairments:  Decreased range of motion, Difficulty walking, Decreased activity tolerance, Decreased knowledge of precautions, Pain, Improper body mechanics, Impaired flexibility, Decreased scar mobility, Decreased knowledge of use of DME, Decreased balance, Increased edema, Postural dysfunction, Decreased strength  Visit Diagnosis: Acute pain of left  knee  Stiffness of left knee, not elsewhere classified  Muscle weakness (generalized)  Difficulty in walking, not elsewhere classified  Localized edema     Problem List Patient Active Problem List   Diagnosis Date Noted  . OA (osteoarthritis) of knee 09/10/2015    Janene Harvey, PT, DPT 08/20/18 12:11 PM   [facility] [address]  [place], [place], [address] Phone: [phone]   Fax:  [phone]  Name: Seth [name] MRN: [id number] Date of Birth: May 22, 1941

## 2018-08-23 ENCOUNTER — Ambulatory Visit: Payer: Medicare Other | Admitting: Physical Therapy

## 2018-08-23 ENCOUNTER — Encounter: Payer: Self-pay | Admitting: Physical Therapy

## 2018-08-23 DIAGNOSIS — M6281 Muscle weakness (generalized): Secondary | ICD-10-CM

## 2018-08-23 DIAGNOSIS — M25562 Pain in left knee: Secondary | ICD-10-CM | POA: Diagnosis not present

## 2018-08-23 DIAGNOSIS — R600 Localized edema: Secondary | ICD-10-CM

## 2018-08-23 DIAGNOSIS — M25662 Stiffness of left knee, not elsewhere classified: Secondary | ICD-10-CM

## 2018-08-23 DIAGNOSIS — R262 Difficulty in walking, not elsewhere classified: Secondary | ICD-10-CM

## 2018-08-23 NOTE — Therapy (Signed)
[facility] [address]  [place], [place], [address] Phone: [phone]   Fax:  [phone]  Physical Therapy Treatment  Patient Details  Name: Seth [name] MRN: [id number] Date of Birth: 1940-12-30 Referring Provider (PT): Gaynelle Arabian, MD   Encounter Date: 08/23/2018  PT End of Session - 08/23/18 1434    Visit Number  2    Number of Visits  13    Date for PT Re-Evaluation  09/17/18    Authorization Type  [employer]HC Medicare    PT Start Time  1344    PT Stop Time  1445    PT Time Calculation (min)  61 min    Activity Tolerance  Patient tolerated treatment well;Patient limited by pain    Behavior During Therapy  [facility] for tasks assessed/performed       Past Medical History:  Diagnosis Date  . Arthritis   . Cancer (HCC)    hx of prostate cancer   . History of kidney stones   . Hypertension   . Pneumonia    06/2015     Past Surgical History:  Procedure Laterality Date  . PROSTATECTOMY    . 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   Procedure: TOTAL KNEE ARTHROPLASTY;  Surgeon: Gaynelle Arabian, MD;  Location: [facility];  Service: Orthopedics;  Laterality: Left;  96min    There were no vitals filed for this visit.  Subjective Assessment - 08/23/18 1346    Subjective  Patient present with wife. Reports he thinks he over-did it with the exercises over the weekend- had swelling and pain. No issues or questions about HEP.    Pertinent History  HTN, hx of kidney stones, hx of prostate CA with prostatectomy, R TKA    Patient Stated Goals  "get better in general"    Currently in Pain?  Yes    Pain Score  7     Pain Location  Knee    Pain Orientation  Left    Pain Descriptors / Indicators  Aching    Pain Type  Acute pain;Surgical pain          [facility] PT Assessment - 08/23/18 0001      Standardized Balance Assessment   Standardized Balance Assessment  Timed Up and Go Test      Timed Up and Go Test   Normal TUG (seconds)  29   with rolling walker                  [facility] Adult PT Treatment/Exercise - 08/23/18 0001      Exercises   Exercises  Knee/Hip      Knee/Hip Exercises: Aerobic   Nustep  L1 x 39min UEs/LEs      Knee/Hip Exercises: Standing   Other Standing Knee Exercises  M/L weight shifts to L 10x5" at counter top      Knee/Hip Exercises: Seated   Long Arc Quad  Strengthening;Left;10 reps;2 sets    [employer] Limitations  cues for TKE    Hamstring Curl  Strengthening;Right;Left;1 set;10 reps    Hamstring Limitations  green TB    Sit to Sand  5 reps;without UE support;2 sets   cues for set up     Knee/Hip Exercises: Supine   Quad Sets  Strengthening;Left;1 set;10 reps;Limitations  heavy manual cues to avoid valsalva   Quad Sets Limitations  10x5" towel roll under knee    Heel Slides  AAROM;Left;1 set;10 reps    Heel Slides Limitations  10x5" to tolerance with strap on orange pball    Bridges  Strengthening;Both;1 set;10 reps    Bridges Limitations  heavy cues for rhythmic breathing    Straight Leg Raises  Strengthening;Left;1 set;10 reps    Straight Leg Raises Limitations  heavy manual cues for slow eccentric lower and avoiding valsalva      Modalities   Modalities  Vasopneumatic      Vasopneumatic   Number Minutes Vasopneumatic   15 minutes    Vasopnuematic Location   Knee   L   Vasopneumatic Pressure  Medium    Vasopneumatic Temperature   max cold             PT Education - 08/23/18 1434    Education Details  update to HEP    Person(s) Educated  Patient    Methods  Explanation;Demonstration;Tactile cues;Verbal cues;Handout    Comprehension  Verbalized understanding;Returned demonstration       PT Short Term Goals - 08/23/18 1438      PT SHORT TERM GOAL #1    Title  Patient to be independent with HEP.    Time  2    Period  Weeks    Status  On-going        PT Long Term Goals - 08/23/18 1438      PT LONG TERM GOAL #1   Title  Patient to be independent with advanced HEP.    Time  4    Period  Weeks    Status  On-going      PT LONG TERM GOAL #2   Title  Patient to demonstrate L kneee AROM/PROM pain-free and symmetrical to opposite LE.     Time  4    Period  Weeks    Status  On-going      PT LONG TERM GOAL #3   Title  Patient to demonstrate >=4+/5 strength in B LEs.     Time  4    Period  Weeks    Status  On-going      PT LONG TERM GOAL #4   Title  Patient to score <14 sec on TUG with LRAD.    Time  4    Period  Weeks    Status  On-going   29.0 sec with rolling walker     PT LONG TERM GOAL #5   Title  Patient to demonstrate symmetrical step length, weight shift, and knee flexion with ambulation with LRAD.    Time  4    Period  Weeks    Status  On-going            Plan - 08/23/18 1435    Clinical Impression Statement  Patient arrived to session with wife, ambulating with rolling walker. Reporting that he feels he "over-did it" with HEP over the weekend. Patient scored 29.0 sec on TUG, indicating increased risk of falls. Worked on STS without UE support with cues for proper set up and upright trunk after each rep. Updated HEP to include this exercise. Reviewed HEP with cues for improved carryover. Patient requiring consistent verbal cuing and demonstrating of rhythmic breathing to avoid Valsalva maneuver. Reporting mild lightheadedness upon sitting from supine, which dissipated quickly. Demonstrated increased anterior trunk lean with standing exercises with manual cues to correct. Ended session with  [employer] to L knee for pain and edema relief. No complaints at end of session.     Clinical Impairments Affecting Rehab Potential  HTN, hx of kidney stones, hx of prostate CA with prostatectomy, R TKA    PT Treatment/Interventions   ADLs/Self Care Home Management;Cryotherapy;Electrical Stimulation;Functional mobility training;Stair training;Gait training;DME Instruction;Ultrasound;Moist Heat;Therapeutic activities;Therapeutic exercise;Balance training;Neuromuscular re-education;Patient/family education;Orthotic Fit/Training;Passive range of motion;Scar mobilization;Manual techniques;Dry needling;Energy conservation;Splinting;Vasopneumatic Device;Taping    PT Next Visit Plan  progress LE strengthening as tolerated    Consulted and Agree with Plan of Care  Patient       Patient will benefit from skilled therapeutic intervention in order to improve the following deficits and impairments:  Decreased range of motion, Difficulty walking, Decreased activity tolerance, Decreased knowledge of precautions, Pain, Improper body mechanics, Impaired flexibility, Decreased scar mobility, Decreased knowledge of use of DME, Decreased balance, Increased edema, Postural dysfunction, Decreased strength  Visit Diagnosis: Acute pain of left knee  Stiffness of left knee, not elsewhere classified  Muscle weakness (generalized)  Difficulty in walking, not elsewhere classified  Localized edema     Problem List Patient Active Problem List   Diagnosis Date Noted  . OA (osteoarthritis) of knee 09/10/2015    Janene Harvey, PT, DPT 08/23/18 2:52 PM   [facility] [address]  [place], [place], [address] Phone: [phone]   Fax:  [phone]  Name: Seth [name] MRN: [id number] Date of Birth: [birth year]

## 2018-08-25 ENCOUNTER — Ambulatory Visit: Payer: Medicare Other

## 2018-08-25 DIAGNOSIS — M6281 Muscle weakness (generalized): Secondary | ICD-10-CM

## 2018-08-25 DIAGNOSIS — R600 Localized edema: Secondary | ICD-10-CM

## 2018-08-25 DIAGNOSIS — M25662 Stiffness of left knee, not elsewhere classified: Secondary | ICD-10-CM

## 2018-08-25 DIAGNOSIS — R262 Difficulty in walking, not elsewhere classified: Secondary | ICD-10-CM

## 2018-08-25 DIAGNOSIS — M25562 Pain in left knee: Secondary | ICD-10-CM | POA: Diagnosis not present

## 2018-08-25 NOTE — Therapy (Signed)
[facility] [address]  [place], [place], [address] Phone: [phone]   Fax:  [phone]  Physical Therapy Treatment  Patient Details  Name: Seth [name] MRN: [id number] Date of Birth: 1940/11/25 Referring Provider (PT): Gaynelle Arabian, MD   Encounter Date: 08/25/2018  PT End of Session - 08/25/18 1021    Visit Number  3    Number of Visits  13    Date for PT Re-Evaluation  09/17/18    Authorization Type  [employer] Medicare    PT Start Time  1014    PT Stop Time  1109    PT Time Calculation (min)  55 min    Activity Tolerance  Patient tolerated treatment well;Patient limited by pain    Behavior During Therapy  [facility] for tasks assessed/performed       Past Medical History:  Diagnosis Date  . Arthritis   . Cancer (HCC)    hx of prostate cancer   . History of kidney stones   . Hypertension   . Pneumonia    06/2015     Past Surgical History:  Procedure Laterality Date  . PROSTATECTOMY    . 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   Procedure: TOTAL KNEE ARTHROPLASTY;  Surgeon: Gaynelle Arabian, MD;  Location: [facility];  Service: Orthopedics;  Laterality: Left;  80min    There were no vitals filed for this visit.  Subjective Assessment - 08/25/18 1019    Subjective  Pt. reporting he is doing HEP daily.     Pertinent History  HTN, hx of kidney stones, hx of prostate CA with prostatectomy, R TKA    Patient Stated Goals  "get better in general"    Currently in Pain?  Yes    Pain Score  4     Pain Location  Knee    Pain Orientation  Left    Pain Descriptors / Indicators  Aching    Pain Type  Acute pain;Surgical pain    Pain Frequency  Intermittent                       [facility] Adult PT Treatment/Exercise - 08/25/18 1043       Ambulation/Gait   Ambulation/Gait  Yes    Ambulation/Gait Assistance  5: Supervision    Ambulation/Gait Assistance Details  Cues for upright posture, spacing to RW     Ambulation Distance (Feet)  90 Feet    Assistive device  Rolling walker      Self-Care   Self-Care  Other Self-Care Comments    Other Self-Care Comments   Instructed pt. on need to "get knee straight"; instructed pt. to sit with heel on chair as to promote extension       Knee/Hip Exercises: Aerobic   Nustep  L1 x 7 min UEs/LEs      Knee/Hip Exercises: Standing   Heel Raises  Both;1 set;15 reps    Heel Raises Limitations  in RW      Knee/Hip Exercises: Seated   Hamstring Curl  Left;1 set;10 reps;Strengthening    Hamstring Limitations  green TB    Sit to Sand  5 reps;1 set      Knee/Hip Exercises: Supine   Quad Sets  Left;1 set;10 reps;Limitations;Strengthening    [employer]  Limitations  pillow squeeze     Short Arc Quad Sets  Left;10 reps    Short Arc Quad Sets Limitations  8" bolster under knee; 3" hold    constant cueing required for proper hold and pacing    Straight Leg Raises  Strengthening;Left;1 set;10 reps    Straight Leg Raises Limitations  Cues to avoid holding breath, cues to maintain straightened knee positioning     Other Supine Knee/Hip Exercises  Straight leg bridge x 10 rpes with heels on peanut p-ball     Other Supine Knee/Hip Exercises  B ankle pumps 2 x 20 reps with heels on peanut p-ball       Vasopneumatic   Number Minutes Vasopneumatic   10 minutes    Vasopnuematic Location   Knee   white wedge bolster under knee    Vasopneumatic Pressure  Medium    Vasopneumatic Temperature   max cold               PT Short Term Goals - 08/23/18 1438      PT SHORT TERM GOAL #1   Title  Patient to be independent with HEP.    Time  2    Period  Weeks    Status  On-going        PT Long Term Goals - 08/23/18 1438      PT LONG TERM GOAL #1   Title  Patient to be independent with  advanced HEP.    Time  4    Period  Weeks    Status  On-going      PT LONG TERM GOAL #2   Title  Patient to demonstrate L kneee AROM/PROM pain-free and symmetrical to opposite LE.     Time  4    Period  Weeks    Status  On-going      PT LONG TERM GOAL #3   Title  Patient to demonstrate >=4+/5 strength in B LEs.     Time  4    Period  Weeks    Status  On-going      PT LONG TERM GOAL #4   Title  Patient to score <14 sec on TUG with LRAD.    Time  4    Period  Weeks    Status  On-going   29.0 sec with rolling walker     PT LONG TERM GOAL #5   Title  Patient to demonstrate symmetrical step length, weight shift, and knee flexion with ambulation with LRAD.    Time  4    Period  Weeks    Status  On-going            Plan - 08/25/18 1022    Clinical Impression Statement  Seth [name] reporting he is performing HEP daily.  Tolerated all gentle ROM and LE strengthening activities in session well today however required cueing throughout session today for quad activation, TKE with gait, and to avoid holding breath with SLR.  Pt. flexion ROM visibly improved today however not formally measured.  Therapist adjusted RW height today lowering it as RW too high.  Strongly encouraged pt. to avoid "leaning on RW" as to promote increased wt. bearing and for upright posture + RW spacing closer to BOS.  Pt. verbalized understanding with good carryover.  Ended visit with ice/compression to L knee to reduce pain and post-exercise swelling.  Will continue to progress toward goals.    Clinical Impairments Affecting Rehab Potential  HTN, hx of  kidney stones, hx of prostate CA with prostatectomy, R TKA    PT Frequency  3x / week    PT Duration  4 weeks    PT Treatment/Interventions  ADLs/Self Care Home Management;Cryotherapy;Electrical Stimulation;Functional mobility training;Stair training;Gait training;DME Instruction;Ultrasound;Moist Heat;Therapeutic activities;Therapeutic exercise;Balance  training;Neuromuscular re-education;Patient/family education;Orthotic Fit/Training;Passive range of motion;Scar mobilization;Manual techniques;Dry needling;Energy conservation;Splinting;Vasopneumatic Device;Taping    PT Next Visit Plan  progress LE strengthening as tolerated    Consulted and Agree with Plan of Care  Patient       Patient will benefit from skilled therapeutic intervention in order to improve the following deficits and impairments:  Decreased range of motion, Difficulty walking, Decreased activity tolerance, Decreased knowledge of precautions, Pain, Improper body mechanics, Impaired flexibility, Decreased scar mobility, Decreased knowledge of use of DME, Decreased balance, Increased edema, Postural dysfunction, Decreased strength  Visit Diagnosis: Acute pain of left knee  Stiffness of left knee, not elsewhere classified  Muscle weakness (generalized)  Difficulty in walking, not elsewhere classified  Localized edema     Problem List Patient Active Problem List   Diagnosis Date Noted  . OA (osteoarthritis) of knee 09/10/2015    Bess Harvest, PTA 08/25/18 12:17 PM   [facility] [address]  [place], [place], [address] Phone: [phone]   Fax:  [phone]  Name: Seth [name] MRN: [id number] Date of Birth: 03-24-1941

## 2018-08-27 ENCOUNTER — Encounter: Payer: Self-pay | Admitting: Physical Therapy

## 2018-08-27 ENCOUNTER — Ambulatory Visit: Payer: Medicare Other | Admitting: Physical Therapy

## 2018-08-27 DIAGNOSIS — M25562 Pain in left knee: Secondary | ICD-10-CM | POA: Diagnosis not present

## 2018-08-27 DIAGNOSIS — M25662 Stiffness of left knee, not elsewhere classified: Secondary | ICD-10-CM

## 2018-08-27 DIAGNOSIS — R262 Difficulty in walking, not elsewhere classified: Secondary | ICD-10-CM

## 2018-08-27 DIAGNOSIS — R600 Localized edema: Secondary | ICD-10-CM

## 2018-08-27 DIAGNOSIS — M6281 Muscle weakness (generalized): Secondary | ICD-10-CM

## 2018-08-27 NOTE — Therapy (Signed)
[facility] [address]  [place], [place], [address] Phone: [phone]   Fax:  [phone]  Physical Therapy Treatment  Patient Details  Name: Seth [name] MRN: [id number] Date of Birth: 09-19-1940 Referring Provider (PT): Gaynelle Arabian, MD   Encounter Date: 08/27/2018  PT End of Session - 08/27/18 1141    Visit Number  4    Number of Visits  13    Date for PT Re-Evaluation  09/17/18    Authorization Type  [employer] Medicare    PT Start Time  1053    PT Stop Time  1137    PT Time Calculation (min)  44 min    Equipment Utilized During Treatment  Gait belt    Activity Tolerance  Patient tolerated treatment well;Patient limited by pain    Behavior During Therapy  [facility] for tasks assessed/performed       Past Medical History:  Diagnosis Date  . Arthritis   . Cancer (HCC)    hx of prostate cancer   . History of kidney stones   . Hypertension   . Pneumonia    06/2015     Past Surgical History:  Procedure Laterality Date  . PROSTATECTOMY    . 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   Procedure: TOTAL KNEE ARTHROPLASTY;  Surgeon: Gaynelle Arabian, MD;  Location: [facility];  Service: Orthopedics;  Laterality: Left;  48min    There were no vitals filed for this visit.  Subjective Assessment - 08/27/18 1056    Subjective  Patient reporting that he feels more stiff today d/t the cold weather. Notes that he has F/U with MD on Tuesday the 21st. Starting to use the [facility], figured that he should try it out.    Pertinent History  HTN, hx of kidney stones, hx of prostate CA with prostatectomy, R TKA    Diagnostic tests  none    Patient Stated Goals  "get better in general"    Currently in Pain?  Yes    Pain Score  6     Pain Location  Knee    Pain  Orientation  Left    Pain Descriptors / Indicators  Aching    Pain Type  Acute pain;Surgical pain                       [facility] Adult PT Treatment/Exercise - 08/27/18 0001      Ambulation/Gait   Ambulation/Gait  Yes    Ambulation/Gait Assistance  4: Min guard    Ambulation Distance (Feet)  80 Feet    Assistive device  Straight cane    Gait Comments  gait training with SPC and CGA with cues for increased step length on R LE and upright trunk      Knee/Hip Exercises: Stretches   ITB Stretch  Left;2 reps;30 seconds    ITB Stretch Limitations  supine w/ strap and assistance to maintain position of stretch      Knee/Hip Exercises: Aerobic   Nustep  L1 x 6 min UEs/LEs      Knee/Hip Exercises: Seated   Long Arc Quad  Strengthening;Left;10 reps;1 set    [employer] Weight  3 lbs.    Long [employer] Limitations  cues for [employer]  to Sand  5 reps;1 set;without UE support   cues for upright trunk; unable to tolerate red TB around LEs     Knee/Hip Exercises: Supine   Quad Sets  Left;1 set;10 reps;Limitations;Strengthening    Quad Sets Limitations  10x10";bolster under L ankle    Heel Slides  AAROM;Left;1 set;10 reps    Heel Slides Limitations  10x5" to tolerance with strap on orange pball    Bridges  Strengthening;Both;1 set;10 reps    Bridges Limitations  straight leg bridge on orange pball with heavy cues for rhythmic breathing    Straight Leg Raises  Strengthening;Left;1 set;10 reps    Straight Leg Raises Limitations  heavy cues to avoid hip ER and promote slow controlled lower      Vasopneumatic   Number Minutes Vasopneumatic   10 minutes   could not tolerate 15 min d/t hip pain   Vasopnuematic Location   Knee   L   Vasopneumatic Pressure  Medium    Vasopneumatic Temperature   max cold             PT Education - 08/27/18 1140    Education Details  advised to keep using rolling walker out in the community, practice [place] at home for safety    Person(s)  Educated  Patient;Spouse    Methods  Explanation;Demonstration    Comprehension  Verbalized understanding       PT Short Term Goals - 08/23/18 1438      PT SHORT TERM GOAL #1   Title  Patient to be independent with HEP.    Time  2    Period  Weeks    Status  On-going        PT Long Term Goals - 08/23/18 1438      PT LONG TERM GOAL #1   Title  Patient to be independent with advanced HEP.    Time  4    Period  Weeks    Status  On-going      PT LONG TERM GOAL #2   Title  Patient to demonstrate L kneee AROM/PROM pain-free and symmetrical to opposite LE.     Time  4    Period  Weeks    Status  On-going      PT LONG TERM GOAL #3   Title  Patient to demonstrate >=4+/5 strength in B LEs.     Time  4    Period  Weeks    Status  On-going      PT LONG TERM GOAL #4   Title  Patient to score <14 sec on TUG with LRAD.    Time  4    Period  Weeks    Status  On-going   29.0 sec with rolling walker     PT LONG TERM GOAL #5   Title  Patient to demonstrate symmetrical step length, weight shift, and knee flexion with ambulation with LRAD.    Time  4    Period  Weeks    Status  On-going            Plan - 08/27/18 1142    Clinical Impression Statement  Patient ambulated into clinic today with use of SPC, reporting that he weaned himself off the walker. Notes increased stiffness in L knee today d/t cold weather. Worked on gait training with SPC and CGA with cues for increased step length on R LE and upright trunk. Patient did have one occurrence of sharp L lateral knee pain  which decreased after sitting break. D/t patient's gait deviations and short step length with SPC, advised to keep using rolling walker out in the community, practice SPC at home for safety. Patient and wife reported understanding. Worked on LE ROM and strengthening this session with patient requiring heavy cues to avoid hip ER positioning and Valsalva maneuver. Offered assistance with TFL stretch to L LE for  hopeful relief of lateral knee pain. Patient requesting not to perform standing ther-ex today, opted for [place] to L knee at end of session d/t pain. No complaints at end of session.     Clinical Impairments Affecting Rehab Potential  HTN, hx of kidney stones, hx of prostate CA with prostatectomy, R TKA    PT Treatment/Interventions  ADLs/Self Care Home Management;Cryotherapy;Electrical Stimulation;Functional mobility training;Stair training;Gait training;DME Instruction;Ultrasound;Moist Heat;Therapeutic activities;Therapeutic exercise;Balance training;Neuromuscular re-education;Patient/family education;Orthotic Fit/Training;Passive range of motion;Scar mobilization;Manual techniques;Dry needling;Energy conservation;Splinting;Vasopneumatic Device;Taping    PT Next Visit Plan  MD F/U note     Consulted and Agree with Plan of Care  Patient       Patient will benefit from skilled therapeutic intervention in order to improve the following deficits and impairments:  Decreased range of motion, Difficulty walking, Decreased activity tolerance, Decreased knowledge of precautions, Pain, Improper body mechanics, Impaired flexibility, Decreased scar mobility, Decreased knowledge of use of DME, Decreased balance, Increased edema, Postural dysfunction, Decreased strength  Visit Diagnosis: Acute pain of left knee  Stiffness of left knee, not elsewhere classified  Muscle weakness (generalized)  Difficulty in walking, not elsewhere classified  Localized edema     Problem List Patient Active Problem List   Diagnosis Date Noted  . OA (osteoarthritis) of knee 09/10/2015    Janene Harvey, PT, DPT 08/27/18 11:53 AM   [facility] [address]  [place], [place], [address] Phone: [phone]   Fax:  [phone]  Name: Seth [name] MRN: [id number] Date of Birth: 08/30/1940

## 2018-08-30 ENCOUNTER — Emergency Department (HOSPITAL_BASED_OUTPATIENT_CLINIC_OR_DEPARTMENT_OTHER): Payer: Medicare Other

## 2018-08-30 ENCOUNTER — Encounter (HOSPITAL_BASED_OUTPATIENT_CLINIC_OR_DEPARTMENT_OTHER): Payer: Self-pay | Admitting: *Deleted

## 2018-08-30 ENCOUNTER — Other Ambulatory Visit: Payer: Self-pay

## 2018-08-30 ENCOUNTER — Observation Stay (HOSPITAL_BASED_OUTPATIENT_CLINIC_OR_DEPARTMENT_OTHER)
Admission: EM | Admit: 2018-08-30 | Discharge: 2018-08-31 | Disposition: A | Discharge status: Home / Self Care | Payer: Medicare Other | Attending: Family Medicine | Admitting: Family Medicine

## 2018-08-30 ENCOUNTER — Ambulatory Visit: Payer: Medicare Other

## 2018-08-30 VITALS — BP 88/66 | HR 118

## 2018-08-30 DIAGNOSIS — Z8546 Personal history of malignant neoplasm of prostate: Secondary | ICD-10-CM | POA: Diagnosis not present

## 2018-08-30 DIAGNOSIS — Z79891 Long term (current) use of opiate analgesic: Secondary | ICD-10-CM | POA: Insufficient documentation

## 2018-08-30 DIAGNOSIS — I10 Essential (primary) hypertension: Secondary | ICD-10-CM

## 2018-08-30 DIAGNOSIS — Z79899 Other long term (current) drug therapy: Secondary | ICD-10-CM | POA: Diagnosis not present

## 2018-08-30 DIAGNOSIS — R55 Syncope and collapse: Principal | ICD-10-CM

## 2018-08-30 DIAGNOSIS — Z87891 Personal history of nicotine dependence: Secondary | ICD-10-CM | POA: Insufficient documentation

## 2018-08-30 DIAGNOSIS — Z7901 Long term (current) use of anticoagulants: Secondary | ICD-10-CM | POA: Insufficient documentation

## 2018-08-30 DIAGNOSIS — R42 Dizziness and giddiness: Secondary | ICD-10-CM | POA: Diagnosis present

## 2018-08-30 DIAGNOSIS — N179 Acute kidney failure, unspecified: Secondary | ICD-10-CM

## 2018-08-30 DIAGNOSIS — M1712 Unilateral primary osteoarthritis, left knee: Secondary | ICD-10-CM

## 2018-08-30 DIAGNOSIS — M1710 Unilateral primary osteoarthritis, unspecified knee: Secondary | ICD-10-CM | POA: Diagnosis not present

## 2018-08-30 DIAGNOSIS — E871 Hypo-osmolality and hyponatremia: Secondary | ICD-10-CM

## 2018-08-30 DIAGNOSIS — E869 Volume depletion, unspecified: Secondary | ICD-10-CM | POA: Diagnosis not present

## 2018-08-30 DIAGNOSIS — M179 Osteoarthritis of knee, unspecified: Secondary | ICD-10-CM | POA: Diagnosis present

## 2018-08-30 LAB — COMPREHENSIVE METABOLIC PANEL
ALT: 34 U/L (ref 0–44)
AST: 31 U/L (ref 15–41)
Albumin: 3.3 g/dL — ABNORMAL LOW (ref 3.5–5.0)
Alkaline Phosphatase: 135 U/L — ABNORMAL HIGH (ref 38–126)
Anion gap: 6 (ref 5–15)
BUN: 14 mg/dL (ref 8–23)
CO2: 22 mmol/L (ref 22–32)
Calcium: 8.9 mg/dL (ref 8.9–10.3)
Chloride: 99 mmol/L (ref 98–111)
Creatinine, Ser: 1.5 mg/dL — ABNORMAL HIGH (ref 0.61–1.24)
GFR calc non Af Amer: 45 mL/min — ABNORMAL LOW (ref >60)
GFR, EST AFRICAN AMERICAN: 52 mL/min — AB (ref >60)
GLUCOSE: 129 mg/dL — AB (ref 70–99)
Potassium: 4 mmol/L (ref 3.5–5.1)
Sodium: 127 mmol/L — ABNORMAL LOW (ref 135–145)
Total Bilirubin: 1 mg/dL (ref 0.3–1.2)
Total Protein: 7.4 g/dL (ref 6.5–8.1)

## 2018-08-30 LAB — URINALYSIS, ROUTINE W REFLEX MICROSCOPIC
Bilirubin Urine: NEGATIVE
Glucose, UA: NEGATIVE mg/dL
Hgb urine dipstick: NEGATIVE
Ketones, ur: NEGATIVE mg/dL
Leukocytes, UA: NEGATIVE
Nitrite: NEGATIVE
Protein, ur: NEGATIVE mg/dL
pH: 7 (ref 5.0–8.0)

## 2018-08-30 LAB — CBC WITH DIFFERENTIAL/PLATELET
Abs Immature Granulocytes: 0.03 10*3/uL (ref 0.00–0.07)
Basophils Absolute: 0 10*3/uL (ref 0.0–0.1)
Basophils Relative: 1 %
EOS ABS: 0.1 10*3/uL (ref 0.0–0.5)
Eosinophils Relative: 2 %
HEMATOCRIT: 36.4 % — AB (ref 39.0–52.0)
Hemoglobin: 11.3 g/dL — ABNORMAL LOW (ref 13.0–17.0)
Immature Granulocytes: 0 %
Lymphocytes Relative: 20 %
Lymphs Abs: 1.4 10*3/uL (ref 0.7–4.0)
MCH: 27.2 pg (ref 26.0–34.0)
MCHC: 31 g/dL (ref 30.0–36.0)
MCV: 87.5 fL (ref 80.0–100.0)
Monocytes Absolute: 0.7 10*3/uL (ref 0.1–1.0)
Monocytes Relative: 11 %
NEUTROS PCT: 66 %
NRBC: 0 % (ref 0.0–0.2)
Neutro Abs: 4.6 10*3/uL (ref 1.7–7.7)
Platelets: 494 10*3/uL — ABNORMAL HIGH (ref 150–400)
RBC: 4.16 MIL/uL — ABNORMAL LOW (ref 4.22–5.81)
RDW: 14.2 % (ref 11.5–15.5)
WBC: 6.8 10*3/uL (ref 4.0–10.5)

## 2018-08-30 LAB — TROPONIN I
Troponin I: <0.03 ng/mL (ref <0.03)
Troponin I: <0.03 ng/mL (ref <0.03)

## 2018-08-30 LAB — I-STAT CG4 LACTIC ACID, ED: Lactic Acid, Venous: 1.15 mmol/L (ref 0.5–1.9)

## 2018-08-30 LAB — CBG MONITORING, ED: Glucose-Capillary: 119 mg/dL — ABNORMAL HIGH (ref 70–99)

## 2018-08-30 MED ORDER — SODIUM CHLORIDE 0.9 % IV SOLN
INTRAVENOUS | Status: DC
  Administered 2018-08-30 – 2018-08-31 (×2): via INTRAVENOUS

## 2018-08-30 MED ORDER — ACETAMINOPHEN 650 MG RE SUPP: 650.0000 mg | Freq: Four times a day (QID) | RECTAL | Status: DC | PRN

## 2018-08-30 MED ORDER — ONDANSETRON HCL 4 MG PO TABS: 4.0000 mg | ORAL_TABLET | Freq: Four times a day (QID) | ORAL | Status: DC | PRN

## 2018-08-30 MED ORDER — RIVAROXABAN 10 MG PO TABS
10.0000 mg | ORAL_TABLET | Freq: Every day | ORAL | Status: DC
  Administered 2018-08-31: 10 mg via ORAL
  Filled 2018-08-30: qty 1

## 2018-08-30 MED ORDER — ASPIRIN EC 81 MG PO TBEC: 81.0000 mg | DELAYED_RELEASE_TABLET | Freq: Every day | ORAL | Status: DC

## 2018-08-30 MED ORDER — SODIUM CHLORIDE 0.9 % IV BOLUS
1000.0000 mL | Freq: Once | INTRAVENOUS | Status: AC
  Administered 2018-08-30: 1000 mL via INTRAVENOUS

## 2018-08-30 MED ORDER — TRIAMCINOLONE ACETONIDE 0.5 % EX CREA: TOPICAL_CREAM | Freq: Two times a day (BID) | CUTANEOUS | Status: DC

## 2018-08-30 MED ORDER — ONDANSETRON HCL 4 MG/2ML IJ SOLN: 4.0000 mg | Freq: Four times a day (QID) | INTRAMUSCULAR | Status: DC | PRN

## 2018-08-30 MED ORDER — TRAMADOL HCL 50 MG PO TABS: 50.0000 mg | ORAL_TABLET | Freq: Four times a day (QID) | ORAL | Status: DC | PRN

## 2018-08-30 MED ORDER — ALBUTEROL SULFATE (2.5 MG/3ML) 0.083% IN NEBU: 2.5000 mg | INHALATION_SOLUTION | Freq: Four times a day (QID) | RESPIRATORY_TRACT | Status: DC | PRN

## 2018-08-30 MED ORDER — ALBUTEROL SULFATE HFA 108 (90 BASE) MCG/ACT IN AERS: 1.0000 | INHALATION_SPRAY | Freq: Four times a day (QID) | RESPIRATORY_TRACT | Status: DC | PRN

## 2018-08-30 MED ORDER — IOPAMIDOL (ISOVUE-370) INJECTION 76%
100.0000 mL | Freq: Once | INTRAVENOUS | Status: AC | PRN
  Administered 2018-08-30: 100 mL via INTRAVENOUS

## 2018-08-30 MED ORDER — LATANOPROST 0.005 % OP SOLN
1.0000 [drp] | Freq: Every day | OPHTHALMIC | Status: DC
  Administered 2018-08-30: 1 [drp] via OPHTHALMIC
  Filled 2018-08-30: qty 2.5

## 2018-08-30 MED ORDER — POLYETHYLENE GLYCOL 3350 17 G PO PACK: 17.0000 g | PACK | Freq: Every day | ORAL | Status: DC | PRN

## 2018-08-30 MED ORDER — DOCUSATE SODIUM 100 MG PO CAPS
100.0000 mg | ORAL_CAPSULE | Freq: Two times a day (BID) | ORAL | Status: DC
  Administered 2018-08-30 – 2018-08-31 (×2): 100 mg via ORAL
  Filled 2018-08-30 (×2): qty 1

## 2018-08-30 MED ORDER — ACETAMINOPHEN 325 MG PO TABS: 650.0000 mg | ORAL_TABLET | Freq: Four times a day (QID) | ORAL | Status: DC | PRN

## 2018-08-30 MED ORDER — METHOCARBAMOL 500 MG PO TABS: 500.0000 mg | ORAL_TABLET | Freq: Four times a day (QID) | ORAL | Status: DC | PRN

## 2018-08-30 MED ORDER — PREDNISONE 10 MG PO TABS: 10.0000 mg | ORAL_TABLET | ORAL | Status: DC | PRN

## 2018-08-30 NOTE — ED Triage Notes (Signed)
Pt brought to ed in w/c by [facility] staff and physical therapy staff, pt bp was 80's over 50's and hr was 115, pta was concerned so wheeled him to [facility]. While waiting in their waiting room pt had brief syncopal episode witnessed by staff and his wife. Upon arrival to ed, pt is a/a/ox4, denies any pain, states "I just feel so dizzy". Pt able to stand and pivot to bed with sba. ekg and iv access obtained while pt being triaged. fsbs 119. Dr. Ralene Bathe at bedside.

## 2018-08-30 NOTE — ED Provider Notes (Signed)
[facility] EMERGENCY DEPARTMENT Provider Note   CSN: [id number] Arrival date & time: 08/30/18  1057     History   Chief Complaint Chief Complaint  Patient presents with  . Dizziness    HPI Seth [name] is a 78 y.o. male.  The history is provided by the patient, the spouse and medical records. No language interpreter was used.  Dizziness   Seth [name] is a 78 y.o. male who presents to the Emergency Department complaining of syncope. He presents to the emergency department for evaluation following a syncopal event that occurred earlier today. He was going to physical therapy today and was noted to be hypotensive and tachycardic. Physical therapy was bringing him to his primary care office for further evaluation when he had a syncopal episode lasting about 30 minutes in the lobby. His head was slumped to the side and his eyes were open and staring into space. He is unsure what happened. He states that when he woke this morning he felt dizzy and lightheaded. His symptoms improved after drinking coffee and he proceeded to physical therapy. He currently denies any complaints. He denies any fevers, headache, chest pain, shortness of breath, Donald pain, nausea, vomiting. He does have some pain and swelling in his left leg following recent surgery on January 6. Past Medical History:  Diagnosis Date  . Arthritis   . Cancer (HCC)    hx of prostate cancer   . History of kidney stones   . Hypertension   . Pneumonia    06/2015     Patient Active Problem List   Diagnosis Date Noted  . Syncope 08/30/2018  . Essential hypertension 08/30/2018  . AKI (acute kidney injury) ([place]) 08/30/2018  . Hyponatremia 08/30/2018  . Volume depletion 08/30/2018  . OA (osteoarthritis) of knee 09/10/2015    Past Surgical History:  Procedure Laterality Date  . PROSTATECTOMY    . 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   Procedure: RIGHT TOTAL KNEE ARTHROPLASTY;  Surgeon: Gaynelle Arabian, MD;   Location: [facility];  Service: Orthopedics;  Laterality: Right;  . 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   Procedure: TOTAL KNEE ARTHROPLASTY;  Surgeon: Gaynelle Arabian, MD;  Location: [facility];  Service: Orthopedics;  Laterality: Left;  19min        Home Medications    Prior to Admission medications   Medication Sig Start Date End Date Taking? Authorizing Provider  albuterol (PROVENTIL HFA;VENTOLIN HFA) 108 (90 Base) MCG/ACT inhaler Inhale 1-2 puffs into the lungs every 6 (six) hours as needed for wheezing or shortness of breath. Patient not taking: Reported on 08/02/2018 11/08/17   Tegeler, Gwenyth Allegra, MD  amLODipine (NORVASC) 10 MG tablet Take 10 mg by mouth every morning. 05/14/14   [provider]  atorvastatin (LIPITOR) 40 MG tablet Take 40 mg by mouth every morning.  08/17/15   [provider]  augmented betamethasone dipropionate (DIPROLENE) 0.05 % ointment Apply topically 2 (two) times daily. Patient not taking: Reported on 08/02/2018 12/10/15   Domenic Moras, PA-C  Brinzolamide-Brimonidine [facility]) 1-0.2 % SUSP Place 1 drop into both eyes 3 (three) times daily.    [provider]  fluocinonide ointment (LIDEX) 8.31 % Apply 1 application topically 2 (two) times daily. Patient not taking: Reported on 08/02/2018 12/10/15   Domenic Moras, PA-C  methocarbamol (ROBAXIN) 500 MG tablet Take 1 tablet (500 mg total) by mouth every 6 (six) hours as needed for muscle spasms. 08/18/18   Edmisten, Ok Anis, PA  predniSONE (  DELTASONE) 10 MG tablet Take 10 mg by mouth as needed (for skin rash).    [provider]  quinapril (ACCUPRIL) 40 MG tablet Take 40 mg by mouth every morning.  08/17/15   [provider]  rivaroxaban (XARELTO) 10 MG TABS tablet Take 1 tablet (10 mg total) by mouth daily with breakfast for 19 days. Then take one 81 mg aspirin once a day for three weeks. Then discontinue aspirin. 08/18/18 09/06/18  Edmisten, Ok Anis, PA  traMADol (ULTRAM) 50 MG  tablet Take 1-2 tablets (50-100 mg total) by mouth every 6 (six) hours as needed for moderate pain. 08/18/18   Edmisten, Kristie L, PA  TRAVATAN Z 0.004 % SOLN ophthalmic solution Place 1 drop into both eyes at bedtime. 07/16/15   [provider]    Family History History reviewed. No pertinent family history.  Social History Social History   Tobacco Use  . Smoking status: Former Smoker    Years: 15.00    Types: Cigarettes    Last attempt to quit: 08/12/2008    Years since quitting: 10.0  . Smokeless tobacco: Never Used  Substance Use Topics  . Alcohol use: Not Currently    Comment: occasionally   . Drug use: No     Allergies   Patient has no known allergies.   Review of Systems Review of Systems  Neurological: Positive for dizziness.  All other systems reviewed and are negative.    Physical Exam Updated Vital Signs BP 116/87   Pulse 94   Temp 97.9 F (36.6 C)   Resp 18   Ht 5\' 11"  (1.803 m)   Wt 117.5 kg   SpO2 96%   BMI 36.12 kg/m   Physical Exam Vitals signs and nursing note reviewed.  Constitutional:      Appearance: He is well-developed. He is diaphoretic.  HENT:     Head: Normocephalic and atraumatic.  Cardiovascular:     Rate and Rhythm: Regular rhythm.     Heart sounds: No murmur.     Comments: tachycardic Pulmonary:     Effort: Pulmonary effort is normal. No respiratory distress.     Comments: Decreased air movement in left lung fields.   Abdominal:     Palpations: Abdomen is soft.     Tenderness: There is no abdominal tenderness. There is no guarding or rebound.  Musculoskeletal:     Comments: 1+ pitting edema to BLE  Skin:    General: Skin is warm.     Capillary Refill: Capillary refill takes less than 2 seconds.  Neurological:     Mental Status: He is alert and oriented to person, place, and time.  Psychiatric:        Behavior: Behavior normal.      ED Treatments / Results  Labs (all labs ordered are listed, but only  abnormal results are displayed) Labs Reviewed  COMPREHENSIVE METABOLIC PANEL - Abnormal; Notable for the following components:      Result Value   Sodium 127 (*)    Glucose, Bld 129 (*)    Creatinine, Ser 1.50 (*)    Albumin 3.3 (*)    Alkaline Phosphatase 135 (*)    GFR calc non Af Amer 45 (*)    GFR calc Af Amer 52 (*)    All other components within normal limits  CBC WITH DIFFERENTIAL/PLATELET - Abnormal; Notable for the following components:   RBC 4.16 (*)    Hemoglobin 11.3 (*)    HCT 36.4 (*)  Platelets 494 (*)    All other components within normal limits  URINALYSIS, ROUTINE W REFLEX MICROSCOPIC - Abnormal; Notable for the following components:   Specific Gravity, Urine <1.005 (*)    All other components within normal limits  CBG MONITORING, ED - Abnormal; Notable for the following components:   Glucose-Capillary 119 (*)    All other components within normal limits  TROPONIN I  I-STAT CG4 LACTIC ACID, ED  I-STAT CG4 LACTIC ACID, ED    EKG EKG Interpretation  Date/Time:  Monday August 30 2018 11:00:08 EST Ventricular Rate:  102 PR Interval:    QRS Duration: 89 QT Interval:  347 QTC Calculation: 452 R Axis:   13 Text Interpretation:  Sinus tachycardia Probable left atrial enlargement Low voltage, precordial leads Abnormal R-wave progression, early transition Confirmed by Quintella Reichert [phone]) on 08/30/2018 12:48:15 PM   Radiology Ct Angio Chest Pe W/cm &/or Wo Cm  Result Date: 08/30/2018 CLINICAL DATA:  Syncope and shortness of breath. History of prostate carcinoma EXAM: CT ANGIOGRAPHY CHEST WITH CONTRAST TECHNIQUE: Multidetector CT imaging of the chest was performed using the standard protocol during bolus administration of intravenous contrast. Multiplanar CT image reconstructions and MIPs were obtained to evaluate the vascular anatomy. CONTRAST:  139mL ISOVUE-370 IOPAMIDOL (ISOVUE-370) INJECTION 76% COMPARISON:  Chest radiograph August 30, 2018 FINDINGS:  Cardiovascular: There is no demonstrable pulmonary embolus. There is no thoracic aortic aneurysm or dissection. The visualized great vessels appear unremarkable. Note that the right innominate and left common carotid arteries arise as a common trunk. The left proximal common carotid artery is quite tortuous. There is aortic atherosclerosis. There are occasional foci of coronary artery calcification. There is no pericardial effusion or pericardial thickening. Mediastinum/Nodes: Thyroid appears unremarkable. There is no appreciable thoracic adenopathy. No esophageal lesions are evident. Lungs/Pleura: There is atelectatic change in both lower lobes. There is no appreciable edema or consolidation. There is no appreciable pleural effusion or pleural thickening evident. Upper Abdomen: There is a cyst in the posterior segment right lobe of the liver measuring 2.3 x 2.3 cm. There is a cyst arising from the lateral upper pole right kidney measuring 2.1 x 2.1 cm. There is a probable cyst arising from the upper pole left kidney measuring approximately 2 cm, incompletely visualized. 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 Visualized upper abdominal structures otherwise appear unremarkable. Musculoskeletal: There are no appreciable blastic or lytic bone lesions. No evident chest wall lesions. Review of the MIP images confirms the above findings. IMPRESSION: 1. No demonstrable pulmonary embolus. No thoracic aortic aneurysm or dissection. There is aortic atherosclerosis. There are foci of coronary artery calcification. 2.  Areas of atelectatic change.  No edema or consolidation. 3.  No appreciable thoracic adenopathy. 4. 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 Aortic Atherosclerosis (ICD10-I70.0). Electronically Signed   By: Lowella Grip III M.D.   On: 08/30/2018 12:33   Dg Chest Port 1  View  Result Date: 08/30/2018 CLINICAL DATA:  Syncope, hypotension, dizziness EXAM: PORTABLE CHEST 1 VIEW COMPARISON:  11/08/2017 FINDINGS: Chronic elevation of the left hemidiaphragm. Low lung volumes with minor basilar atelectasis. No definite focal pneumonia, collapse or consolidation. Negative for edema, effusion or pneumothorax. Trachea is midline. Normal heart size and vascularity. Thoracic spondylosis noted. IMPRESSION: Low lung volumes with basilar atelectasis. Chronic left hemidiaphragm elevation Electronically Signed   By: Jerilynn Mages.  Shick M.D.  On: 08/30/2018 11:30    Procedures Procedures (including critical care time) CRITICAL CARE Performed by: Quintella Reichert   Total critical care time: 35 minutes  Critical care time was exclusive of separately billable procedures and treating other patients.  Critical care was necessary to treat or prevent imminent or life-threatening deterioration.  Critical care was time spent personally by me on the following activities: development of treatment plan with patient and/or surrogate as well as nursing, discussions with consultants, evaluation of patient's response to treatment, examination of patient, obtaining history from patient or surrogate, ordering and performing treatments and interventions, ordering and review of laboratory studies, ordering and review of radiographic studies, pulse oximetry and re-evaluation of patient's condition.  Medications Ordered in ED Medications  sodium chloride 0.9 % bolus 1,000 mL ( Intravenous Stopped 08/30/18 1238)  iopamidol (ISOVUE-370) 76 % injection 100 mL (100 mLs Intravenous Contrast Given 08/30/18 1212)  sodium chloride 0.9 % bolus 1,000 mL (1,000 mLs Intravenous Transfusing/Transfer 08/30/18 1413)     Initial Impression / Assessment and Plan / ED Course  I have reviewed the triage vital signs and the nursing notes.  Pertinent labs & imaging results that were available during my care of the patient were  reviewed by me and considered in my medical decision making (see chart for details).     Patient presents to the emergency department for evaluation of dizziness and syncopal episode. He is mildly dizzy on ED arrival with diaphoresis. He is hypotensive and tachycardic on ED arrival. He was treated with IV fluid resuscitation. Labs demonstrate hyponatremia with mild elevation is creatinine. Given recent surgery CTA was obtained that is negative for PE. It is unclear exactly what medication regimen he is taking at home but states that his blood pressure medicines were recently discontinued and then restarted. He is also taking pain medications following his knee surgery. Concern that he may be accidentally taking too many anti-hypertensive medications contributing to his dizziness. No evidence of acute infectious process at this time. Plan to admit for observation for AKI and hypotension.  Patient updated of findings of studies and recommendation for admission and he is in agreement with treatment plan.    Final Clinical Impressions(s) / ED Diagnoses   Final diagnoses:  None    ED Discharge Orders    None       Quintella Reichert, MD 08/30/18 1452

## 2018-08-30 NOTE — H&P (Addendum)
[facility] History and Physical  WILLEY DUE CBJ:[id number] DOB: [birth year] DOA: 08/30/2018  Referring physician: ED  PCP: Jamesetta Orleans, PA-C   Chief Complaint: Syncope  HPI: Seth [name] is a 78 y.o. male with past medical history of hypertension, arthritis, prostate cancer, status post left total knee replacement on August 16, 2018, presented to [facility] from [facility] with complaints of syncope.  Patient was at the physical therapy clinic where he was noted to be hypotensive with tachycardia.  Patient was then taken to the PCP office where he syncopized briefly for 30 seconds or so.  He had been having lightheadedness and dizziness today.  Subsequently, he was taken to emergency at [facility] med center.  Patient stated that he had occasional dizziness after surgery but today he was lightheaded and dizzy.    At the time of my interview, patient denied any dizziness, lightheadedness.  Denied any chest pain, palpitation or shortness of breath.  Denied any fever, chills or rigor.  Denies nausea, vomiting but has poor appetite.  He has been recently constipated.  Denies urinary burning or dysuria.  Denies any cough congestion or dyspnea.  ED Course: In the ED at [facility], patient received IV fluid boluses.  Patient was then considered for admission to the hospital for syncope, volume depletion, mild acute kidney injury.  Review of Systems:  All systems were reviewed and were negative unless otherwise mentioned in the HPI  Past Medical History:  Diagnosis Date  . Arthritis   . Cancer (HCC)    hx of prostate cancer   . History of kidney stones   . Hypertension   . Pneumonia    06/2015    Past Surgical History:  Procedure Laterality Date  . PROSTATECTOMY    . 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   Procedure: RIGHT TOTAL KNEE ARTHROPLASTY;  Surgeon: Gaynelle Arabian, MD;  Location: [facility] ORS;  Service: Orthopedics;  Laterality: Right;    . 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   Procedure: TOTAL KNEE ARTHROPLASTY;  Surgeon: Gaynelle Arabian, MD;  Location: [facility] ORS;  Service: Orthopedics;  Laterality: Left;  39min    Social History:  reports that he quit smoking about 10 years ago. His smoking use included cigarettes. He quit after 15.00 years of use. He has never used smokeless tobacco. He reports previous alcohol use. He reports that he does not use drugs.  No Known Allergies  History reviewed. No pertinent family history.   Prior to Admission medications   Medication Sig Start Date End Date Taking? Authorizing Provider  albuterol (PROVENTIL HFA;VENTOLIN HFA) 108 (90 Base) MCG/ACT inhaler Inhale 1-2 puffs into the lungs every 6 (six) hours as needed for wheezing or shortness of breath. Patient not taking: Reported on 08/02/2018 11/08/17   Tegeler, Gwenyth Allegra, MD  amLODipine (NORVASC) 10 MG tablet Take 10 mg by mouth every morning. 05/14/14   [provider]  atorvastatin (LIPITOR) 40 MG tablet Take 40 mg by mouth every morning.  08/17/15   [provider]  augmented betamethasone dipropionate (DIPROLENE) 0.05 % ointment Apply topically 2 (two) times daily. Patient not taking: Reported on 08/02/2018 12/10/15   Domenic Moras, PA-C  Brinzolamide-Brimonidine [facility]) 1-0.2 % SUSP Place 1 drop into both eyes 3 (three) times daily.    [provider]  fluocinonide ointment (LIDEX) 1.60 % Apply 1 application topically 2 (two) times daily. Patient not taking: Reported on 08/02/2018 12/10/15   Domenic Moras, PA-C  methocarbamol (ROBAXIN) 500 MG tablet Take 1 tablet (500 mg total) by mouth every 6 (six) hours as needed for muscle spasms. 08/18/18   Edmisten, Kristie L, PA  predniSONE (DELTASONE) 10 MG tablet Take 10 mg by mouth as needed (for skin rash).    [provider]  quinapril (ACCUPRIL) 40 MG tablet Take 40 mg by mouth every morning.  08/17/15   [provider]  rivaroxaban (XARELTO) 10 MG  TABS tablet Take 1 tablet (10 mg total) by mouth daily with breakfast for 19 days. Then take one 81 mg aspirin once a day for three weeks. Then discontinue aspirin. 08/18/18 09/06/18  Edmisten, Ok Anis, PA  traMADol (ULTRAM) 50 MG tablet Take 1-2 tablets (50-100 mg total) by mouth every 6 (six) hours as needed for moderate pain. 08/18/18   Edmisten, Kristie L, PA  TRAVATAN Z 0.004 % SOLN ophthalmic solution Place 1 drop into both eyes at bedtime. 07/16/15   [provider]    Physical Exam: Vitals:   08/30/18 1400 08/30/18 1415 08/30/18 1430 08/30/18 1530  BP: 106/77 109/82 116/87 120/75  Pulse: 91  94 95  Resp: 19 (!) 21 18 16   Temp:    98 F (36.7 C)  TempSrc:    Oral  SpO2: 94%  96% 97%  Weight:      Height:       Wt Readings from Last 3 Encounters:  08/30/18 117.5 kg  08/16/18 117.5 kg  08/12/18 117.5 kg   Body mass index is 36.12 kg/m.  General:  Average built, not in obvious distress, obese HENT: Normocephalic, pupils equally reacting to light and accommodation.  No scleral pallor or icterus noted. Oral mucosa is moist.  Chest:  Clear breath sounds.  Diminished breath sounds bilaterally. No crackles or wheezes.  CVS: S1 &S2 heard. No murmur.  Regular rate and rhythm. Abdomen: Soft, nontender, nondistended.  Bowel sounds are heard.  Liver is not palpable, no abdominal mass palpated Extremities: No cyanosis, clubbing but left knee with trace edema.  Left total knee replacement with healed incision, mild edema without erythema.  Peripheral pulses are palpable. Psych: Alert, awake and oriented, normal mood CNS:  No cranial nerve deficits.  Power equal in all extremities.   No cerebellar signs.   Skin: Warm and dry.  Left knee healing incision  Labs on Admission:   CBC: Recent Labs  Lab 08/30/18 1117  WBC 6.8  NEUTROABS 4.6  HGB 11.3*  HCT 36.4*  MCV 87.5  PLT 494*    Basic Metabolic Panel: Recent Labs  Lab 08/30/18 1117  NA 127*  K 4.0  CL 99  CO2 22   GLUCOSE 129*  BUN 14  CREATININE 1.50*  CALCIUM 8.9    Liver Function Tests: Recent Labs  Lab 08/30/18 1117  AST 31  ALT 34  ALKPHOS 135*  BILITOT 1.0  PROT 7.4  ALBUMIN 3.3*   No results for input(s): LIPASE, AMYLASE in the last 168 hours. No results for input(s): AMMONIA in the last 168 hours.  Cardiac Enzymes: Recent Labs  Lab 08/30/18 1117  TROPONINI <0.03    BNP (last 3 results) Recent Labs    11/08/17 1058  BNP 25.8    ProBNP (last 3 results) No results for input(s): PROBNP in the last 8760 hours.  CBG: Recent Labs  Lab 08/30/18 1104  GLUCAP 119*    Lipase  No results found for: LIPASE   Urinalysis    Component Value Date/Time   COLORURINE  YELLOW 08/30/2018 [place] 08/30/2018 1318   LABSPEC <1.005 (L) 08/30/2018 1318   PHURINE 7.0 08/30/2018 1318   GLUCOSEU NEGATIVE 08/30/2018 [place] 08/30/2018 [place] 08/30/2018 [place] 08/30/2018 1318   PROTEINUR NEGATIVE 08/30/2018 1318   NITRITE NEGATIVE 08/30/2018 1318   [place] 08/30/2018 1318     Drugs of Abuse  No results found for: LABOPIA, COCAINSCRNUR, LABBENZ, AMPHETMU, THCU, LABBARB    Radiological Exams on Admission: Ct Angio Chest Pe W/cm &/or Wo Cm  Result Date: 08/30/2018 CLINICAL DATA:  Syncope and shortness of breath. History of prostate carcinoma EXAM: CT ANGIOGRAPHY CHEST WITH CONTRAST TECHNIQUE: Multidetector CT imaging of the chest was performed using the standard protocol during bolus administration of intravenous contrast. Multiplanar CT image reconstructions and MIPs were obtained to evaluate the vascular anatomy. CONTRAST:  157mL ISOVUE-370 IOPAMIDOL (ISOVUE-370) INJECTION 76% COMPARISON:  Chest radiograph August 30, 2018 FINDINGS: Cardiovascular: There is no demonstrable pulmonary embolus. There is no thoracic aortic aneurysm or dissection. The visualized great vessels appear unremarkable. Note  that the right innominate and left common carotid arteries arise as a common trunk. The left proximal common carotid artery is quite tortuous. There is aortic atherosclerosis. There are occasional foci of coronary artery calcification. There is no pericardial effusion or pericardial thickening. Mediastinum/Nodes: Thyroid appears unremarkable. There is no appreciable thoracic adenopathy. No esophageal lesions are evident. Lungs/Pleura: There is atelectatic change in both lower lobes. There is no appreciable edema or consolidation. There is no appreciable pleural effusion or pleural thickening evident. Upper Abdomen: There is a cyst in the posterior segment right lobe of the liver measuring 2.3 x 2.3 cm. There is a cyst arising from the lateral upper pole right kidney measuring 2.1 x 2.1 cm. There is a probable cyst arising from the upper pole left kidney measuring approximately 2 cm, incompletely visualized. 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 Visualized upper abdominal structures otherwise appear unremarkable. Musculoskeletal: There are no appreciable blastic or lytic bone lesions. No evident chest wall lesions. Review of the MIP images confirms the above findings. IMPRESSION: 1. No demonstrable pulmonary embolus. No thoracic aortic aneurysm or dissection. There is aortic atherosclerosis. There are foci of coronary artery calcification. 2.  Areas of atelectatic change.  No edema or consolidation. 3.  No appreciable thoracic adenopathy. 4. 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 Aortic Atherosclerosis (ICD10-I70.0). Electronically Signed   By: Lowella Grip III M.D.   On: 08/30/2018 12:33   Dg Chest Port 1 View  Result Date: 08/30/2018 CLINICAL DATA:  Syncope, hypotension, dizziness EXAM: PORTABLE CHEST 1 VIEW COMPARISON:  11/08/2017 FINDINGS: Chronic elevation of the left  hemidiaphragm. Low lung volumes with minor basilar atelectasis. No definite focal pneumonia, collapse or consolidation. Negative for edema, effusion or pneumothorax. Trachea is midline. Normal heart size and vascularity. Thoracic spondylosis noted. IMPRESSION: Low lung volumes with basilar atelectasis. Chronic left hemidiaphragm elevation Electronically Signed   By: Jerilynn Mages.  Shick M.D.   On: 08/30/2018 11:30    EKG: Personally reviewed by me which shows sinus tachycardia  Assessment/Plan Principal Problem:   Syncope Active Problems:   OA (osteoarthritis) of knee   Essential hypertension   AKI (acute kidney injury) ([place])   Hyponatremia   Volume depletion   Syncope likely secondary to volume depletion/ hypotension with use of narcotics  and antihypertensives.  Placed in observation, in telemetry.  Rule out cardiac arrhythmias.  Check orthostatic vitals.  Continue on IV fluid hydration.  Patient received IV fluid bolus at med center.  We will continue with hydration.  Will hold antihypertensives, sedative hypnotics for now.  Check 2D echocardiogram to assess for cardiac function.  Status post left knee replacement.  Undergoing physical therapy as outpatient.  Will get physical therapy, occupational therapy to evaluate in a.m.  Hypertension.  Will closely monitor.  Hold outpatient medications for now  due to syncope possibility of orthostatic hypotension..  Start antihypertensives as clinically indicated.  Patient was told not to take antihypertensives until seen by primary care physician after surgery  Acute kidney injury secondary to volume depletion.  Continue on IV fluids.  Check BMP in a.m.  Hyponatremia.  Likely secondary volume depletion.  Continue on IV fluid hydration.  Check BMP in a.m.  Consultant: None  Code Status: Full code  DVT Prophylaxis: Xarelto for DVT prophylaxis after knee surgery  Antibiotics: None  Family Communication:  Patients' condition and plan of care including  tests being ordered have been discussed with the patient and multiple family members who indicate understanding and agree with the plan.  Disposition Plan: Home with outpatient physical therapy.  Likely tomorrow  Severity of Illness: The appropriate patient status for this patient is OBSERVATION. Observation status is judged to be reasonable and necessary in order to provide the required intensity of service to ensure the patient's safety. The patient's presenting symptoms, physical exam findings, and initial radiographic and laboratory data in the context of their medical condition is felt to place them at decreased risk for further clinical deterioration. Furthermore, it is anticipated that the patient will be medically stable for discharge from the hospital within 2 midnights of admission.    Signed, Flora Lipps, MD [facility] 08/30/2018

## 2018-08-30 NOTE — ED Notes (Signed)
Per patient, he states that he did take his medications today, including BP and pain medication.  His wife states that he has not been eating and drinking well since his surgery.  Pt is sleepy but arouses with verbal stimulation.

## 2018-08-30 NOTE — Therapy (Signed)
[facility] [address]  [place], [place], [address] Phone: [phone]   Fax:  [phone]  Patient Details  Name: Seth [name] MRN: [id number] Date of Birth: 11-28-1940 Referring Provider:  Libby Maw,*  Encounter Date: 08/30/2018   Subjective: Pt. and wife reporting pt. not feeling well this morning and almost called to cancel therapy due to "lightheadedness".  Pt. wife reporting pt. had coffee before coming to therapy and felt better thus did not cancel session.     Vitals:   08/30/18 1037 08/30/18 1042  BP: (!) 86/66 (!) 88/66  Pulse: (!) 115 (!) 118  SpO2: 95% 92%    Impression: Pt. and wife seen in waiting room reporting pt. did not feel well this morning.  Pt. vitals recorded at 88/71mmHg resting BP, 115 bpm resting HR initially in waiting room and pt. and wife informed pt. was not therapeutic for today's session.  Supervising PT made aware of pt. status and present with discussion with wife regarding need to be seen by MD. [facility] retrieved and pt. transferred to primary care MD across hall.  Once in primary care office waiting room, pt. became unresponsive while sitting in WC with head slumped over and eyes open, unresponsive to verbal/tactile cueing x 30 sec.  Pt. quickly assessed by nurse with vitals recorded at 120bpm, 86% O2 sats while seated in WC and pt. became responsive with VC/TC's following 30 sec however with slurred speech and confusion once awake.  Pt. quickly transported to first floor ER via [place] accompanied by wife with medical personnel meeting therapist on way to ER in hallway.  Pt. and wife accompanied to ER with pt. increasing alertness and becoming more oriented to questions.  Pt. left in ER with ER personnel (see ER note).    Bess Harvest, PTA 08/30/18 12:53 PM   [facility] [address]  [place], [place],  [address] Phone: [phone]   Fax:  [phone]

## 2018-08-30 NOTE — Therapy (Deleted)
[facility] [address]  [place], [place], [address] Phone: [phone]   Fax:  [phone]  Patient Details  Name: Seth [name] MRN: [id number] Date of Birth: 1941-05-31 Referring Provider:  Libby Maw,*  Encounter Date: 08/30/2018   Bess Harvest 08/30/2018, 12:14 PM  [facility] [address]  [place], [place], [address] Phone: [phone]   Fax:  [phone]

## 2018-08-31 ENCOUNTER — Observation Stay (HOSPITAL_BASED_OUTPATIENT_CLINIC_OR_DEPARTMENT_OTHER): Payer: Medicare Other

## 2018-08-31 DIAGNOSIS — R55 Syncope and collapse: Secondary | ICD-10-CM

## 2018-08-31 LAB — CBC
HCT: 35.1 % — ABNORMAL LOW (ref 39.0–52.0)
Hemoglobin: 10.8 g/dL — ABNORMAL LOW (ref 13.0–17.0)
MCH: 27.1 pg (ref 26.0–34.0)
MCHC: 30.8 g/dL (ref 30.0–36.0)
MCV: 88.2 fL (ref 80.0–100.0)
Platelets: 457 10*3/uL — ABNORMAL HIGH (ref 150–400)
RBC: 3.98 MIL/uL — ABNORMAL LOW (ref 4.22–5.81)
RDW: 14.6 % (ref 11.5–15.5)
WBC: 6.3 10*3/uL (ref 4.0–10.5)
nRBC: 0 % (ref 0.0–0.2)

## 2018-08-31 LAB — BASIC METABOLIC PANEL WITH GFR
Anion gap: 7 (ref 5–15)
BUN: 12 mg/dL (ref 8–23)
CO2: 20 mmol/L — ABNORMAL LOW (ref 22–32)
Calcium: 8.3 mg/dL — ABNORMAL LOW (ref 8.9–10.3)
Chloride: 106 mmol/L (ref 98–111)
Creatinine, Ser: 1.09 mg/dL (ref 0.61–1.24)
GFR calc Af Amer: >60 mL/min
GFR calc non Af Amer: >60 mL/min
Glucose, Bld: 94 mg/dL (ref 70–99)
Potassium: 4.2 mmol/L (ref 3.5–5.1)
Sodium: 133 mmol/L — ABNORMAL LOW (ref 135–145)

## 2018-08-31 LAB — TROPONIN I: Troponin I: <0.03 ng/mL

## 2018-08-31 LAB — ECHOCARDIOGRAM COMPLETE
Height: 71 in
WEIGHTICAEL: 4000.03 [oz_av]

## 2018-08-31 MED ORDER — TRAMADOL HCL 50 MG PO TABS
100.0000 mg | ORAL_TABLET | Freq: Four times a day (QID) | ORAL | Status: DC
  Administered 2018-08-31: 100 mg via ORAL
  Filled 2018-08-31: qty 2

## 2018-08-31 NOTE — Progress Notes (Signed)
OT Cancellation Note  Patient Details Name: Seth [name] MRN: [id number] DOB: 30-Mar-1941   Cancelled Treatment:    Reason Eval/Treat Not Completed: PT screened, no needs identified, will sign off  [name] 08/31/2018, 11:13 AM  Lesle Chris, OTR/L Acute Rehabilitation Services [phone] [facility] pager [phone] office 08/31/2018

## 2018-08-31 NOTE — Progress Notes (Signed)
  Echocardiogram 2D Echocardiogram has been performed.  Leana Springston L Androw 08/31/2018, 9:01 AM

## 2018-08-31 NOTE — Discharge Summary (Signed)
Physician Discharge Summary  Seth [name] EQA:[id number] DOB: 12/08/1940 DOA: 08/30/2018  PCP: Jamesetta Orleans, PA-C  Admit date: 08/30/2018 Discharge date: 08/31/2018  Time spent: 35 minutes  Recommendations for Outpatient Follow-up:  1. Recommend outpatient referral to orthopedics for probable trochanteric bursitis on the right side 2. Recommend use of Tylenol as first choice and tramadol second choice for pain-have recommended not to use high-dose aspirin for pain as is already on Xarelto 3. I have discontinued prednisone this admission given inherent risks please follow-up as an outpatient 4. 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  Procedures: Impressions:   - LVEF 60-65%, mild LVH, normal wall motion, normal diastolic   function, trivial MR, normal LA size, normal IVC Consultations:  nad  Discharge Exam: Vitals:   08/31/18  0456 08/31/18 0834  BP: 117/85   Pulse: (!) 108 97  Resp: 18   Temp: 98.1 F (36.7 C)   SpO2: 96%     General: Awake alert pleasant no distress EOMI NCAT looking about stated age Cardiovascular: S1-S2 no murmur rub or gallop Respiratory: Clinically clear no rales no rhonchi Abdomen soft no rebound no guarding Left leg is slightly swollen knee is a little bit tender right hip is painful  Discharge Instructions   Discharge Instructions    Diet - low sodium heart healthy   Complete by:  As directed    Discharge instructions   Complete by:  As directed    You were found to have some dizziness likely secondary to use of blood pressure medications and dehydration so please do not take any of your blood pressure meds until you see your primary care physician In addition I have prescribed for you tramadol which is a pain medicine that you can use aspirin and high doses can cause you to have some dehydration and kidney damage so be careful using that if your pain is only mild in your hip or in your knees use Tylenol 500 over-the-counter 3-4 times a day if you need to You will need labs in about 1 week's time and discussion with your primary care physician about your hip pains and may be a referral to the orthopedic surgeon that has seen you in the past   Increase activity slowly   Complete by:  As directed      Allergies as of 08/31/2018   No Known Allergies     Medication List    STOP taking these medications  amLODipine 10 MG tablet Commonly known as:  NORVASC   predniSONE 10 MG tablet Commonly known as:  DELTASONE   quinapril 40 MG tablet Commonly known as:  ACCUPRIL     TAKE these medications   albuterol 108 (90 Base) MCG/ACT inhaler Commonly known as:  PROVENTIL HFA;VENTOLIN HFA Inhale 1-2 puffs into the lungs every 6 (six) hours as needed for wheezing or shortness of breath.   aspirin 500 MG tablet Take 500 mg by mouth every 6 (six) hours as needed for pain.    atorvastatin 40 MG tablet Commonly known as:  LIPITOR Take 40 mg by mouth every morning.   augmented betamethasone dipropionate 0.05 % ointment Commonly known as:  DIPROLENE Apply topically 2 (two) times daily.   fluocinonide ointment 0.05 % Commonly known as:  LIDEX Apply 1 application topically 2 (two) times daily.   methocarbamol 500 MG tablet Commonly known as:  ROBAXIN Take 1 tablet (500 mg total) by mouth every 6 (six) hours as needed for muscle spasms.   rivaroxaban 10 MG Tabs tablet Commonly known as:  XARELTO Take 1 tablet (10 mg total) by mouth daily with breakfast for 19 days. Then take one 81 mg aspirin once a day for three weeks. Then discontinue aspirin.   SIMBRINZA 1-0.2 % Susp Generic drug:  Brinzolamide-Brimonidine Place 1 drop into both eyes 2 (two) times daily.   traMADol 50 MG tablet Commonly known as:  ULTRAM Take 1-2 tablets (50-100 mg total) by mouth every 6 (six) hours as needed for moderate pain.   TRAVATAN Z 0.004 % Soln ophthalmic solution Generic drug:  Travoprost (BAK Free) Place 1 drop into both eyes at bedtime.      No Known Allergies    The results of significant diagnostics from this hospitalization (including imaging, microbiology, ancillary and laboratory) are listed below for reference.    Significant Diagnostic Studies: Ct Angio Chest Pe W/cm &/or Wo Cm  Result Date: 08/30/2018 CLINICAL DATA:  Syncope and shortness of breath. History of prostate carcinoma EXAM: CT ANGIOGRAPHY CHEST WITH CONTRAST TECHNIQUE: Multidetector CT imaging of the chest was performed using the standard protocol during bolus administration of intravenous contrast. Multiplanar CT image reconstructions and MIPs were obtained to evaluate the vascular anatomy. CONTRAST:  159mL ISOVUE-370 IOPAMIDOL (ISOVUE-370) INJECTION 76% COMPARISON:  Chest radiograph August 30, 2018 FINDINGS: Cardiovascular: There is no demonstrable pulmonary embolus. There is no thoracic aortic  aneurysm or dissection. The visualized great vessels appear unremarkable. Note that the right innominate and left common carotid arteries arise as a common trunk. The left proximal common carotid artery is quite tortuous. There is aortic atherosclerosis. There are occasional foci of coronary artery calcification. There is no pericardial effusion or pericardial thickening. Mediastinum/Nodes: Thyroid appears unremarkable. There is no appreciable thoracic adenopathy. No esophageal lesions are evident. Lungs/Pleura: There is atelectatic change in both lower lobes. There is no appreciable edema or consolidation. There is no appreciable pleural effusion or pleural thickening evident. Upper Abdomen: There is a cyst in the posterior segment right lobe of the liver measuring 2.3 x 2.3 cm. There is a cyst arising from the lateral upper pole right kidney measuring 2.1 x 2.1 cm. There is a probable cyst arising from the upper pole left kidney measuring approximately 2 cm, incompletely visualized. 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 Visualized upper abdominal structures otherwise appear unremarkable. Musculoskeletal: There are no appreciable blastic or lytic bone lesions. No evident  chest wall lesions. Review of the MIP images confirms the above findings. IMPRESSION: 1. No demonstrable pulmonary embolus. No thoracic aortic aneurysm or dissection. There is aortic atherosclerosis. There are foci of coronary artery calcification. 2.  Areas of atelectatic change.  No edema or consolidation. 3.  No appreciable thoracic adenopathy. 4. 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 Aortic Atherosclerosis (ICD10-I70.0). Electronically Signed   By: Lowella Grip III M.D.   On: 08/30/2018 12:33   Dg Chest Port 1 View  Result Date: 08/30/2018 CLINICAL DATA:  Syncope, hypotension, dizziness EXAM: PORTABLE  CHEST 1 VIEW COMPARISON:  11/08/2017 FINDINGS: Chronic elevation of the left hemidiaphragm. Low lung volumes with minor basilar atelectasis. No definite focal pneumonia, collapse or consolidation. Negative for edema, effusion or pneumothorax. Trachea is midline. Normal heart size and vascularity. Thoracic spondylosis noted. IMPRESSION: Low lung volumes with basilar atelectasis. Chronic left hemidiaphragm elevation Electronically Signed   By: Jerilynn Mages.  Shick M.D.   On: 08/30/2018 11:30    Microbiology: No results found for this or any previous visit (from the past 240 hour(s)).   Labs: Basic Metabolic Panel: Recent Labs  Lab 08/30/18 1117 08/31/18 0317  NA 127* 133*  K 4.0 4.2  CL 99 106  CO2 22 20*  GLUCOSE 129* 94  BUN 14 12  CREATININE 1.50* 1.09  CALCIUM 8.9 8.3*   Liver Function Tests: Recent Labs  Lab 08/30/18 1117  AST 31  ALT 34  ALKPHOS 135*  BILITOT 1.0  PROT 7.4  ALBUMIN 3.3*   No results for input(s): LIPASE, AMYLASE in the last 168 hours. No results for input(s): AMMONIA in the last 168 hours. CBC: Recent Labs  Lab 08/30/18 1117 08/31/18 0317  WBC 6.8 6.3  NEUTROABS 4.6  --   HGB 11.3* 10.8*  HCT 36.4* 35.1*  MCV 87.5 88.2  PLT 494* 457*   Cardiac Enzymes: Recent Labs  Lab 08/30/18 1117 08/30/18 1605 08/31/18 0317  TROPONINI <0.03 <0.03 <0.03   BNP: BNP (last 3 results) Recent Labs    11/08/17 1058  BNP 25.8    ProBNP (last 3 results) No results for input(s): PROBNP in the last 8760 hours.  CBG: Recent Labs  Lab 08/30/18 1104  GLUCAP 119*       Signed:  Nita Sells MD   [facility] 08/31/2018, 11:18 AM

## 2018-08-31 NOTE — Progress Notes (Signed)
Pt discharged home today per MD. Pt's IV site D/C'd and WDL. Pt's VSS. Pt provided with home medication list, discharge instructions and prescriptions. Verbalized understanding. Pt left floor via WC in stable condition accompanied by RN. 

## 2018-08-31 NOTE — Evaluation (Signed)
Physical Therapy Evaluation Patient Details Name: Seth [name] MRN: [id number] DOB: 03/26/1941 Today's Date: 08/31/2018   History of Present Illness  78 yo male s/p L TKA 08/16/17. Hx of R TKA 2017, experienced a syncopal episode at [facility] on 1/20.  Clinical Impression  The patient ambulated x 150', no dizziness. HR 123. BP sup=132/87, post ambulation=120/98. RN aware. Patient plans DC today. Defer to MD when patient can resume OPPT. If not DC'd, will continue PT/set goals.     Follow Up Recommendations Outpatient PT    Equipment Recommendations  None recommended by PT    Recommendations for Other Services       Precautions / Restrictions Precautions Precautions: Fall;Knee      Mobility  Bed Mobility Overal bed mobility: Needs Assistance Bed Mobility: Supine to Sit     Supine to sit: Supervision     General bed mobility comments: extra time  Transfers Overall transfer level: Needs assistance Equipment used: Rolling walker (2 wheeled) Transfers: Sit to/from Stand Sit to Stand: Min guard            Ambulation/Gait Ambulation/Gait assistance: Min guard Gait Distance (Feet): 150 Feet Assistive device: Rolling walker (2 wheeled) Gait Pattern/deviations: Step-through pattern     General Gait Details: gait is steady with RW, stady turns  Stairs            Wheelchair Mobility    Modified Rankin (Stroke Patients Only)       Balance                                             Pertinent Vitals/Pain Pain Assessment: Faces Pain Score: 0-No pain Faces Pain Scale: Hurts little more Pain Location: right hip Pain Descriptors / Indicators: Cramping;Discomfort;Sharp Pain Intervention(s): Monitored during session    Home Living Family/patient expects to be discharged to:: Private residence Living Arrangements: Spouse/significant other Available Help at Discharge: Family Type of Home: House Home Access: Stairs to enter Entrance  Stairs-Rails: Right Entrance Stairs-Number of Steps: 2 Home Layout: One [place]: Environmental consultant - 2 wheels;Cane - single point      Prior Function Level of Independence: Needs assistance   Gait / Transfers Assistance Needed: amb. w/ RW, sponge bath           Hand Dominance        Extremity/Trunk Assessment   Upper Extremity Assessment Upper Extremity Assessment: Overall WFL for tasks assessed    Lower Extremity Assessment Lower Extremity Assessment: LLE deficits/detail LLE Deficits / Details: knee flexion passive stretch 10-80 flexion    Cervical / Trunk Assessment Cervical / Trunk Assessment: Normal  Communication   Communication: No difficulties  Cognition   Behavior During Therapy: WFL for tasks assessed/performed Overall Cognitive Status: Within Functional Limits for tasks assessed                                        General Comments      Exercises     Assessment/Plan    PT Assessment All further PT needs can be met in the next venue of care(pt to DC home today)  PT Problem List Decreased strength;Decreased range of motion;Decreased balance;Decreased mobility;Decreased activity tolerance;Pain;Decreased knowledge of use of DME       PT Treatment Interventions DME instruction;Gait  training;Functional mobility training;Balance training;Therapeutic activities;Therapeutic exercise;Stair training;Patient/family education    PT Goals (Current goals can be found in the Care Plan section)  Acute Rehab PT Goals Patient Stated Goal: home.  PT Goal Formulation: All assessment and education complete, DC therapy    Frequency     Barriers to discharge        Co-evaluation               AM-PAC PT "6 Clicks" Mobility  Outcome Measure Help needed turning from your back to your side while in a flat bed without using bedrails?: A Little Help needed moving from lying on your back to sitting on the side of a flat bed without using  bedrails?: A Little Help needed moving to and from a bed to a chair (including a wheelchair)?: A Little Help needed standing up from a chair using your arms (e.g., wheelchair or bedside chair)?: A Little Help needed to walk in hospital room?: A Little Help needed climbing 3-5 steps with a railing? : A Little 6 Click Score: 18    End of Session Equipment Utilized During Treatment: Gait belt Activity Tolerance: Patient tolerated treatment well Patient left: in chair;with call bell/phone within reach;with chair alarm set Nurse Communication: Mobility status PT Visit Diagnosis: Difficulty in walking, not elsewhere classified (R26.2);Pain;Muscle weakness (generalized) (M62.81);Other abnormalities of gait and mobility (R26.89) Pain - Right/Left: Right Pain - part of body: Hip    Time: 1030-1106 PT Time Calculation (min) (ACUTE ONLY): 36 min   Charges:   PT Evaluation $PT Eval Low Complexity: 1 Low PT Treatments $Gait Training: 8-22 mins        [name] PT Acute Rehabilitation Services Pager [phone] Office [phone]   [name] 08/31/2018, 11:42 AM

## 2018-09-01 ENCOUNTER — Ambulatory Visit: Payer: Medicare Other | Admitting: Physical Therapy

## 2018-09-03 ENCOUNTER — Ambulatory Visit: Payer: Medicare Other

## 2018-09-03 VITALS — BP 116/84 | HR 134

## 2018-09-03 DIAGNOSIS — M25562 Pain in left knee: Secondary | ICD-10-CM

## 2018-09-03 DIAGNOSIS — R262 Difficulty in walking, not elsewhere classified: Secondary | ICD-10-CM

## 2018-09-03 DIAGNOSIS — M25662 Stiffness of left knee, not elsewhere classified: Secondary | ICD-10-CM

## 2018-09-03 DIAGNOSIS — R600 Localized edema: Secondary | ICD-10-CM

## 2018-09-03 DIAGNOSIS — M6281 Muscle weakness (generalized): Secondary | ICD-10-CM

## 2018-09-03 NOTE — Therapy (Signed)
[facility] [address]  [place], [place], [address] Phone: [phone]   Fax:  [phone]  Patient Details  Name: Seth [name] MRN: [id number] Date of Birth: 12/10/1940 Referring Provider:  Libby Maw,*  Encounter Date: 09/03/2018  Subjective:  Pt. wife reporting pt. feeling better today.  Pt. reporting some "dizziness" today in waiting room.     Vitals:   09/03/18 1016 09/03/18 1025  BP: 110/80 116/84  Pulse: (!) 137 (!) 134  SpO2: 98% 97%    Impression: Pt. Seen in waiting room after being seen in ER and hospital over last few days for unstable vitals and syncope.  Pt. was unable to be seen for last therapy session due to unstable vitals on Monday.  Pt. seen in waiting room today with wife reporting pt. feeling much better.  Pt. with report initially of some dizziness upon standing with RW.  Vitals taken and BP 110/39mmHG, resting HR 137 bpm in seated position.  Re-recorded vitals ~ 5 min later at 116/48mmHg, 134bpm.  O2% remained 95-98%.  Pt. unable to be seen for therapy session today due to elevated HR and wife and pt. educated regarding this contraindication for exercise.  Supervising PT made aware of pt. status.  Pt. and wife encouraged to contact PCP regarding elevated HR.  Wife requiring further education regarding reasoning behind contraindication to exercise session today and heavy cueing to contact MD regarding Pt. elevated resting HR.  Will monitor pt. status in upcoming session.    Bess Harvest, PTA 09/03/18 12:45 PM   [facility] [address]  [place], [place], [address] Phone: [phone]   Fax:  [phone]

## 2018-09-06 ENCOUNTER — Ambulatory Visit: Payer: Medicare Other | Admitting: Physical Therapy

## 2018-09-08 ENCOUNTER — Ambulatory Visit: Payer: Medicare Other

## 2018-09-10 ENCOUNTER — Ambulatory Visit: Payer: Medicare Other | Admitting: Physical Therapy

## 2018-09-10 ENCOUNTER — Encounter: Payer: Self-pay | Admitting: Physical Therapy

## 2018-09-10 VITALS — BP 110/84 | HR 105

## 2018-09-10 NOTE — Therapy (Signed)
Patient present with wife- both reporting that patient saw his PCP who recently changed his meds. Patient reports that he started taking this medication last night and has been feeling fine. Vitals taken, with patient still tachycardic at rest even after sitting rest break. Advised patient and wife that he is not safe for exercise at this time, advised to continue taking meds as directed by MD. Plan to see patient at next visit to monitor vitals and readiness for resumption of exercise. Both reported understanding.   Vitals:   09/10/18 1000 09/10/18 1004  BP: 110/84   Pulse: (!) 115 (!) 105  SpO2: 95% 96%    Janene Harvey, PT, DPT 09/10/18 10:15 AM

## 2018-09-13 ENCOUNTER — Ambulatory Visit: Payer: Medicare Other | Attending: Student

## 2018-09-13 VITALS — BP 120/80 | HR 104

## 2018-09-13 DIAGNOSIS — R600 Localized edema: Secondary | ICD-10-CM | POA: Diagnosis present

## 2018-09-13 DIAGNOSIS — M6281 Muscle weakness (generalized): Secondary | ICD-10-CM | POA: Insufficient documentation

## 2018-09-13 DIAGNOSIS — M25562 Pain in left knee: Secondary | ICD-10-CM | POA: Insufficient documentation

## 2018-09-13 DIAGNOSIS — R262 Difficulty in walking, not elsewhere classified: Secondary | ICD-10-CM | POA: Insufficient documentation

## 2018-09-13 DIAGNOSIS — M25662 Stiffness of left knee, not elsewhere classified: Secondary | ICD-10-CM | POA: Insufficient documentation

## 2018-09-13 NOTE — Therapy (Addendum)
[facility] [address]  [place], [place], [address] Phone: [phone]   Fax:  [phone]  Physical Therapy Treatment  Patient Details  Name: Seth [name] MRN: [id number] Date of Birth: 06-Jan-1941 Referring Provider (PT): Gaynelle Arabian, MD   Encounter Date: 09/13/2018  PT End of Session - 09/13/18 1032    Visit Number  7    Number of Visits  13    Date for PT Re-Evaluation  09/17/18    Authorization Type  [employer] Medicare    PT Start Time  1941    PT Stop Time  1100    PT Time Calculation (min)  45 min    Activity Tolerance  Patient tolerated treatment well    Behavior During Therapy  [facility] for tasks assessed/performed       Past Medical History:  Diagnosis Date  . Arthritis   . Cancer (HCC)    hx of prostate cancer   . History of kidney stones   . Hypertension   . Pneumonia    06/2015     Past Surgical History:  Procedure Laterality Date  . PROSTATECTOMY    . 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   Procedure: TOTAL KNEE ARTHROPLASTY;  Surgeon: Gaynelle Arabian, MD;  Location: [facility];  Service: Orthopedics;  Laterality: Left;  40min    Vitals:   09/13/18 1022 09/13/18 1027 09/13/18 1028 09/13/18 1031  BP: 120/80     Pulse: 98 (!) 104 96 (!) 104  SpO2: 97%       Subjective Assessment - 09/13/18 1022    Subjective  Pt. and wife reporting he has been taking medications as instructed by MD.      Pertinent History  HTN, hx of kidney stones, hx of prostate CA with prostatectomy, R TKA    Patient Stated Goals  "get better in general"    Currently in Pain?  Yes    Pain Score  3     Pain Location  Knee    Pain Orientation  Left    Pain Descriptors / Indicators  Aching;Dull    Pain Type  Acute pain;Surgical pain    Multiple Pain  Sites  No         [facility] PT Assessment - 09/13/18 0001      AROM   AROM Assessment Site  Knee    Right/Left Knee  Left    Left Knee Extension  4    Left Knee Flexion  96      PROM   PROM Assessment Site  Knee    Right/Left Knee  Left    Left Knee Extension  3    Left Knee Flexion  108   Improved to 108 dg after manual patellar mobs, stretching      Ambulation/Gait   Ambulation/Gait  Yes    Ambulation/Gait Assistance  5: Supervision    Ambulation Distance (Feet)  90 Feet    Assistive device  Straight cane    Gait Comments  cues provided for upright posture                    [facility] Adult PT Treatment/Exercise - 09/13/18 0001      Knee/Hip Exercises: Stretches   Passive Hamstring Stretch  Left;1 rep;30 seconds  Passive Hamstring Stretch Limitations  strap     Hip Flexor Stretch  Left;1 rep;60 seconds    Hip Flexor Stretch Limitations  quad+ hip flexor stretch with therapist overpressure and cueing not to hold breath      Knee/Hip Exercises: Seated   Long Arc Quad  Left;10 reps;Weights;Strengthening;2 sets   Cues to avoid holding breath; 2nd set with red TB    Long Arc Quad Weight  3 lbs.    Long [employer] Limitations  cues for [employer]    Hamstring Curl  Left;1 set;15 reps;Strengthening    Hamstring Limitations  green TB      Knee/Hip Exercises: Supine   Bridges  Both;10 reps    Bridges Limitations  cues not to hold breath    Straight Leg Raises  2 sets;Left;10 reps;Strengthening    Straight Leg Raises Limitations  cues to avoid holding breath     Knee Flexion  Both;AAROM;10 reps    Knee Flexion Limitations  with heels on peanut p-ball and strap assistance       Knee/Hip Exercises: Sidelying   Hip ABduction  Left;10 reps    Hip ABduction Limitations  manual cueing for positioning       Manual Therapy   Manual Therapy  Joint mobilization;Passive ROM    Manual therapy comments  supine     Joint Mobilization  L patellar mobs for improved ROM     Passive  ROM  L knee HS stretch with therapist x 30 sec              PT Education - 09/13/18 1838    Education Details  HEP update; red looped TB issued to pt.     Person(s) Educated  Patient    Methods  Explanation;Demonstration;Verbal cues;Handout    Comprehension  Verbalized understanding;Returned demonstration;Verbal cues required;Need further instruction       PT Short Term Goals - 09/13/18 1302      PT SHORT TERM GOAL #1   Title  Patient to be independent with HEP.    Time  2    Period  Weeks    Status  Achieved        PT Long Term Goals - 08/23/18 1438      PT LONG TERM GOAL #1   Title  Patient to be independent with advanced HEP.    Time  4    Period  Weeks    Status  On-going      PT LONG TERM GOAL #2   Title  Patient to demonstrate L kneee AROM/PROM pain-free and symmetrical to opposite LE.     Time  4    Period  Weeks    Status  On-going      PT LONG TERM GOAL #3   Title  Patient to demonstrate >=4+/5 strength in B LEs.     Time  4    Period  Weeks    Status  On-going      PT LONG TERM GOAL #4   Title  Patient to score <14 sec on TUG with LRAD.    Time  4    Period  Weeks    Status  On-going   29.0 sec with rolling walker     PT LONG TERM GOAL #5   Title  Patient to demonstrate symmetrical step length, weight shift, and knee flexion with ambulation with LRAD.    Time  4    Period  Weeks    Status  On-going            Plan - 09/13/18 1302    Clinical Impression Statement  Pt. arrived to session with wife reporting he has been taking medications as directed by MD.  Initial HR elevated today however vitals WFL within session today.  Supervising PT made aware of pt. status and therex reserved to gentle supine/seated strengthening and ROM activities due to elevated HR however pt. tolerated all activities well in session and denied dizziness, lightheadedness, or other unusual symptoms.  Pt. able to demo improved L knee AROM today 4-96 dg, PROM 3-108  dg demonstrating progress toward LTG #2.  ROM greatly improved today after manual therapy addressing decreased patellar mobility, manual LE stretching, and scar mobility.  Progressing well toward goals.      Clinical Impairments Affecting Rehab Potential  HTN, hx of kidney stones, hx of prostate CA with prostatectomy, R TKA    PT Treatment/Interventions  ADLs/Self Care Home Management;Cryotherapy;Electrical Stimulation;Functional mobility training;Stair training;Gait training;DME Instruction;Ultrasound;Moist Heat;Therapeutic activities;Therapeutic exercise;Balance training;Neuromuscular re-education;Patient/family education;Orthotic Fit/Training;Passive range of motion;Scar mobilization;Manual techniques;Dry needling;Energy conservation;Splinting;Vasopneumatic Device;Taping    Consulted and Agree with Plan of Care  Patient       Patient will benefit from skilled therapeutic intervention in order to improve the following deficits and impairments:  Decreased range of motion, Difficulty walking, Decreased activity tolerance, Decreased knowledge of precautions, Pain, Improper body mechanics, Impaired flexibility, Decreased scar mobility, Decreased knowledge of use of DME, Decreased balance, Increased edema, Postural dysfunction, Decreased strength  Visit Diagnosis: Acute pain of left knee  Stiffness of left knee, not elsewhere classified  Muscle weakness (generalized)  Difficulty in walking, not elsewhere classified  Localized edema     Problem List Patient Active Problem List   Diagnosis Date Noted  . Syncope 08/30/2018  . Essential hypertension 08/30/2018  . AKI (acute kidney injury) ([place]) 08/30/2018  . Hyponatremia 08/30/2018  . Volume depletion 08/30/2018  . OA (osteoarthritis) of knee 09/10/2015    Bess Harvest, PTA 09/13/18 6:38 PM   [facility] [address]  [place], [place], [address] Phone: [phone]   Fax:   [phone]  Name: NATHANYL ANDUJO MRN: [id number] Date of Birth: Jun 02, 1941

## 2018-09-15 ENCOUNTER — Ambulatory Visit: Payer: Medicare Other

## 2018-09-15 VITALS — BP 122/80 | HR 107

## 2018-09-15 DIAGNOSIS — R262 Difficulty in walking, not elsewhere classified: Secondary | ICD-10-CM

## 2018-09-15 DIAGNOSIS — M25562 Pain in left knee: Secondary | ICD-10-CM

## 2018-09-15 DIAGNOSIS — M25662 Stiffness of left knee, not elsewhere classified: Secondary | ICD-10-CM

## 2018-09-15 DIAGNOSIS — M6281 Muscle weakness (generalized): Secondary | ICD-10-CM

## 2018-09-15 DIAGNOSIS — R600 Localized edema: Secondary | ICD-10-CM

## 2018-09-15 NOTE — Therapy (Addendum)
[name] [address]  [name], [name], [name] Phone: [phone]   Fax:  [phone]  Physical Therapy Treatment  Patient Details  Name: Seth [name] MRN: [id number] Date of Birth: January 13, 1941 Referring Provider (PT): Gaynelle Arabian, MD   Progress Note Reporting Period 08/20/18 to 09/15/18  See note below for Objective Data and Assessment of Progress/Goals.    Encounter Date: 09/15/2018  PT End of Session - 09/15/18 1019    Visit Number  8    Number of Visits  13    Date for PT Re-Evaluation  09/17/18    Authorization Type  [employer] Medicare    PT Start Time  7124    PT Stop Time  1110    PT Time Calculation (min)  55 min    Activity Tolerance  Patient tolerated treatment well    Behavior During Therapy  WFL for tasks assessed/performed       Past Medical History:  Diagnosis Date  . Arthritis   . Cancer (HCC)    hx of prostate cancer   . History of kidney stones   . Hypertension   . Pneumonia    06/2015     Past Surgical History:  Procedure Laterality Date  . PROSTATECTOMY    . 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   Procedure: TOTAL KNEE ARTHROPLASTY;  Surgeon: Gaynelle Arabian, MD;  Location: [facility];  Service: Orthopedics;  Laterality: Left;  30mn    Vitals:   09/15/18 1019 09/15/18 1029 09/15/18 1042  BP: 122/80    Pulse: 97 97 (!) 107  SpO2: 97% 93% 93%    Subjective Assessment - 09/15/18 1019    Subjective  Pt. seen without wife today reporting she had doctors appointment.      Pertinent History  HTN, hx of kidney stones, hx of prostate CA with prostatectomy, R TKA    Patient Stated Goals  "get better in general"    Currently in Pain?  Yes    Pain Score  5     Pain Location  Knee    Pain Orientation  Left     Pain Descriptors / Indicators  Aching;Dull    Pain Type  Acute pain;Surgical pain    Pain Frequency  Intermittent    Multiple Pain Sites  No         [facility] PT Assessment - 09/15/18 0001      Timed Up and Go Test   TUG  Normal TUG    Normal TUG (seconds)  15.34   no AD                  [facility] Adult PT Treatment/Exercise - 09/15/18 1017      Ambulation/Gait   Ambulation/Gait  Yes    Ambulation/Gait Assistance  5: Supervision    Ambulation Distance (Feet)  150 Feet    Assistive device  Straight cane    Gait Comments  Cues required for proper sequencing and upright posture; poor carryover with pt. likely requiring further instruction; Improved gait speed without SPC however poor L LE clearance without SPC       Knee/Hip Exercises: Aerobic   Nustep  L1 x 3 min LEs only    Stopped when HR > 100 bpm     Knee/Hip Exercises:  Standing   Heel Raises  Both;1 set;15 reps    Heel Raises Limitations  counter     Forward Step Up  Left;10 reps;Hand Hold: 1;Step Height: 6"    Forward Step Up Limitations  Cues for proper sequencing; UE support on TM rail     Functional Squat  10 reps;3 seconds;1 set    Functional Squat Limitations  counter to chair with two airex pads;       Knee/Hip Exercises: Seated   Long Arc Quad  Left;1 set;15 reps;Weights    Long Arc Quad Weight  3 lbs.    Long [employer] Limitations  cues for [employer]    Other Seated Knee/Hip Exercises  L Seated fitter leg press (1 black band, 1 blue band) 2 x 15 reps     Hamstring Curl  Left;1 set;15 reps;Strengthening    Hamstring Limitations  green TB    Sit to Sand  2 sets;10 reps;without UE support   from table with airex pad; second set from chair only     Vasopneumatic   Number Minutes Vasopneumatic   10 minutes    Vasopnuematic Location   Knee    Vasopneumatic Pressure  Low    Vasopneumatic Temperature   max cold               PT Short Term Goals - 09/13/18 1302      PT SHORT TERM GOAL #1   Title   Patient to be independent with HEP.    Time  2    Period  Weeks    Status  Achieved        PT Long Term Goals - 09/15/18 1046      PT LONG TERM GOAL #1   Title  Patient to be independent with advanced HEP.    Time  4    Period  Weeks    Status  Partially Met      PT LONG TERM GOAL #2   Title  Patient to demonstrate L kneee AROM/PROM pain-free and symmetrical to opposite LE.     Time  4    Period  Weeks    Status  On-going      PT LONG TERM GOAL #3   Title  Patient to demonstrate >=4+/5 strength in B LEs.     Time  4    Period  Weeks    Status  On-going      PT LONG TERM GOAL #4   Title  Patient to score <14 sec on TUG with LRAD.    Time  4    Period  Weeks    Status  On-going   29.0 sec with rolling walker     PT LONG TERM GOAL #5   Title  Patient to demonstrate symmetrical step length, weight shift, and knee flexion with ambulation with LRAD.    Time  4    Period  Weeks    Status  On-going            Plan - 09/15/18 1047    Clinical Impression Statement  Seth [name] seen to start session with elevated HR however therapeutic for session.  Monitored HR throughout session to avoid overexertion with pt. requiring frequent sitting rest breaks with standing activities for recovery.  Able to initiate forward 6" step-ups, counter squats, and seated fitter leg press without issue however did require cueing to avoid pt. holding breath during exerting activities.  Ended visit with ice/compression to L knee to  reduce post-exercise soreness/swelling.  Progressing well toward goals.      Clinical Impairments Affecting Rehab Potential  HTN, hx of kidney stones, hx of prostate CA with prostatectomy, R TKA    PT Treatment/Interventions  ADLs/Self Care Home Management;Cryotherapy;Electrical Stimulation;Functional mobility training;Stair training;Gait training;DME Instruction;Ultrasound;Moist Heat;Therapeutic activities;Therapeutic exercise;Balance training;Neuromuscular  re-education;Patient/family education;Orthotic Fit/Training;Passive range of motion;Scar mobilization;Manual techniques;Dry needling;Energy conservation;Splinting;Vasopneumatic Device;Taping    PT Next Visit Plan  L knee ROM and strengtheing; monitor VS     Consulted and Agree with Plan of Care  Patient       Patient will benefit from skilled therapeutic intervention in order to improve the following deficits and impairments:  Decreased range of motion, Difficulty walking, Decreased activity tolerance, Decreased knowledge of precautions, Pain, Improper body mechanics, Impaired flexibility, Decreased scar mobility, Decreased knowledge of use of DME, Decreased balance, Increased edema, Postural dysfunction, Decreased strength  Visit Diagnosis: Acute pain of left knee  Stiffness of left knee, not elsewhere classified  Muscle weakness (generalized)  Difficulty in walking, not elsewhere classified  Localized edema     Problem List Patient Active Problem List   Diagnosis Date Noted  . Syncope 08/30/2018  . Essential hypertension 08/30/2018  . AKI (acute kidney injury) ([place]) 08/30/2018  . Hyponatremia 08/30/2018  . Volume depletion 08/30/2018  . OA (osteoarthritis) of knee 09/10/2015    Bess Harvest, PTA 09/15/18 12:22 PM   [name] [address]  [place], [name], [address] Phone: [phone]   Fax:  [phone]  Name: Seth [name] MRN: [id number] Date of Birth: 06-08-1941  PHYSICAL THERAPY DISCHARGE SUMMARY  Visits from Start of Care: 8  Current functional level related to goals / functional outcomes: Unable to assess; patient's wife called that they wanted to see the MD before scheduling more visits; patient did not return    Remaining deficits: Unable to assess    Education / Equipment: HEP  Plan: Patient agrees to discharge.  Patient goals were not met. Patient is being discharged due to not  returning since the last visit.  ?????     Janene Harvey, PT, DPT 10/14/18 1:46 PM

## 2018-09-17 ENCOUNTER — Ambulatory Visit: Payer: Medicare Other | Admitting: Physical Therapy

## 2018-09-27 ENCOUNTER — Other Ambulatory Visit: Payer: Self-pay

## 2018-09-27 ENCOUNTER — Encounter (HOSPITAL_BASED_OUTPATIENT_CLINIC_OR_DEPARTMENT_OTHER): Payer: Self-pay | Admitting: *Deleted

## 2018-09-27 ENCOUNTER — Emergency Department (HOSPITAL_BASED_OUTPATIENT_CLINIC_OR_DEPARTMENT_OTHER)
Admission: EM | Admit: 2018-09-27 | Discharge: 2018-09-27 | Disposition: A | Discharge status: Home / Self Care | Payer: Medicare Other | Attending: Emergency Medicine | Admitting: Emergency Medicine

## 2018-09-27 DIAGNOSIS — R040 Epistaxis: Secondary | ICD-10-CM | POA: Insufficient documentation

## 2018-09-27 DIAGNOSIS — Z5321 Procedure and treatment not carried out due to patient leaving prior to being seen by health care provider: Secondary | ICD-10-CM | POA: Insufficient documentation

## 2018-09-27 NOTE — ED Notes (Signed)
Pt reports that he no longer wants to wait to be seen.  No bleeding noted. Denies other symptoms. Reports that he has an appointment with his doctor tomorrow and will f/u if needed.  Ambulatory.

## 2018-09-27 NOTE — ED Triage Notes (Addendum)
Pt reports nose bleed today-lasted approx 10 minutes.  No bleeding at this time. Denies injury or pain.  Denies blood thinners.

## 2019-06-27 ENCOUNTER — Other Ambulatory Visit: Payer: Self-pay

## 2019-06-27 ENCOUNTER — Encounter: Payer: Self-pay | Admitting: Physical Therapy

## 2019-06-27 ENCOUNTER — Ambulatory Visit: Payer: Medicare Other | Attending: Physician Assistant | Admitting: Physical Therapy

## 2019-06-27 DIAGNOSIS — M6281 Muscle weakness (generalized): Secondary | ICD-10-CM | POA: Insufficient documentation

## 2019-06-27 DIAGNOSIS — M5441 Lumbago with sciatica, right side: Secondary | ICD-10-CM | POA: Diagnosis present

## 2019-06-27 DIAGNOSIS — R2681 Unsteadiness on feet: Secondary | ICD-10-CM | POA: Diagnosis present

## 2019-06-27 DIAGNOSIS — R262 Difficulty in walking, not elsewhere classified: Secondary | ICD-10-CM | POA: Insufficient documentation

## 2019-06-27 DIAGNOSIS — M62838 Other muscle spasm: Secondary | ICD-10-CM | POA: Diagnosis present

## 2019-06-27 NOTE — Therapy (Signed)
[facility] [address]  [place], [place], [address] Phone: [phone]   Fax:  [phone]  Physical Therapy Evaluation  Patient Details  Name: [name] MRN: [id number] Date of Birth: 01/14/1941 Referring Provider (PT): Gerrit Halls, PA-C   Encounter Date: 06/27/2019  PT End of Session - 06/27/19 0911    Visit Number  1    Number of Visits  16    Date for PT Re-Evaluation  08/22/19    Authorization Type  [employer]HC Medicare [place]: [place]    PT Start Time  0911    PT Stop Time  1023    PT Time Calculation (min)  72 min    Activity Tolerance  Patient tolerated treatment well    Behavior During Therapy  [facility] for tasks assessed/performed       Past Medical History:  Diagnosis Date  . Arthritis   . Cancer (HCC)    hx of prostate cancer   . History of kidney stones   . Hypertension   . Pneumonia    06/2015     Past Surgical History:  Procedure Laterality Date  . PROSTATECTOMY    . 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   Procedure: TOTAL KNEE ARTHROPLASTY;  Surgeon: Gaynelle Arabian, MD;  Location: [facility];  Service: Orthopedics;  Laterality: Left;  42min    There were no vitals filed for this visit.   Subjective Assessment - 06/27/19 0916    Subjective  Pt reporting recent onset of LBP and R hip pain a few months with gradual onset. Pain mostly localized to R buttock. Pt also noted to stumble 2x due to catching his R foot while walking back to treatment room for eval - pt attributes this to his shoes. Reports MD office provided him with a back brace but he has difficulty getting the brace on.    Pertinent History  B TKA    Limitations  Sitting;Standing;Walking;House hold activities;Other (comment)    How long can you sit comfortably?   1 hr    How long can you stand comfortably?  1 hr    How long can you walk comfortably?  not sure    Diagnostic tests  Lumbar x-ray: Per MD note, X-rays of his lumbar spine show a slight list to the left. On the lateral view he does have some mild degenerative disc changes at L4-5 and L5-S1 with some foraminal stenosis and grade 1 slips of L3-4, 4-5.    Patient Stated Goals  "become more mobile"    Currently in Pain?  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   Yes    Is the patient reluctant to leave their home because of a fear of falling?   No      Home Film/video editor residence    Living Arrangements  Spouse/significant other    Available Help at Discharge  Family    Type of [place] to enter    Entrance Stairs-Number of Steps  3    Entrance Stairs-Rails  None;Right;Left   rails at back door only   Home Layout  One level    [place] - single point      Prior Function    Level of [place]  Part time employment    Chief Strategy Officer business    Leisure  fishing; no regular exercise (bought an exercise bike but has not used it)      Cognition   Overall Cognitive Status  Within Functional Limits for tasks assessed      Observation/Other Assessments   Focus on Therapeutic Outcomes (FOTO)   Lumbar - 54% (46% limitation); Predicted 63% (37% limitation)      ROM / Strength   AROM / PROM / Strength  AROM;Strength      AROM   AROM Assessment Site  Lumbar    Lumbar Flexion  50% limited     Lumbar Extension  90% limited - pain in R buttock    Lumbar - Right Side Bend  70% limited - pain in R buttock    Lumbar - Left Side Bend  20% limited    Lumbar - Right Rotation  80% limited    Lumbar - Left Rotation  80% limited      Strength   Overall Strength Comments  tested in sitting    Strength Assessment Site  Hip;Knee;Ankle    Right/Left Hip  Right;Left    Right Hip Flexion  3+/5   pain in R buttock   Right Hip External Rotation   3+/5   pain in R buttock   Right Hip Internal Rotation  3+/5   pain in R buttock   Right Hip ABduction  4/5    Right Hip ADduction  4/5   pain in R buttock   Left Hip Flexion  4-/5   pain in R buttock   Left Hip External Rotation  4-/5    Left Hip Internal Rotation  4-/5    Left Hip ABduction  4/5    Left Hip ADduction  4/5    Right/Left Knee  Right;Left    Right Knee Flexion  4/5   pain in R buttock   Right Knee Extension  4/5   pain in R buttock   Left Knee Flexion  4+/5    Left Knee Extension  4+/5    Right/Left Ankle  Right;Left    Right Ankle Dorsiflexion  3+/5    Right Ankle Plantar Flexion  2+/5    Left Ankle Dorsiflexion  4/5    Left Ankle Plantar Flexion  2+/5      Flexibility   Soft Tissue Assessment /Muscle Length  yes    Hamstrings  mod tight B    ITB  mod tight B    Piriformis  mod tight R>L      Palpation   Palpation comment  ttp t/o R buttock,  denies ttp in lumbar paraspinals                Objective measurements completed on examination: See above findings.      [facility] PT Treatment/Exercise - 06/27/19 0911      Exercises   Exercises  Lumbar      Lumbar Exercises: Stretches   Single Knee to Chest Stretch  Right;30 seconds;1 rep    Single Knee to Chest Stretch Limitations  towel assist    Piriformis Stretch  Right;30 seconds;1 rep    Piriformis Stretch Limitations  KTOS - towel assist      Lumbar Exercises: Supine   Ab Set  5 reps;5 seconds    AB Set Limitations  cues to avoid holding breath    Pelvic Tilt  5 reps;5 seconds    Pelvic Tilt Limitations  limited motion - cues to avoid holding breath    Bridge  10 reps;3 seconds    Bridge Limitations  limited lift      Modalities   Modalities  Moist Heat      Moist Heat Therapy   Number Minutes Moist Heat  15 Minutes    Moist Heat Location  Lumbar Spine;Hip   R buttock            PT Education - 06/27/19 1015    Education Details  PT eval findings, anticipated POC & initial HEP    Person(s) Educated  Patient    Methods  Explanation;Demonstration;Handout    Comprehension  Verbalized understanding;Returned demonstration;Need further instruction       PT Short Term Goals - 06/27/19 1309      PT SHORT TERM GOAL #1   Title  Patient will be independent with initial HEP    Status  New    Target Date  07/18/19      PT SHORT TERM GOAL #2   Title  Formally assess balance if continued instability noted    Baseline  patient demonstrating LOB upon walking through clinic to treatment room    Status  New    Target Date  07/18/19        PT Long Term Goals - 06/27/19 1309      PT LONG TERM GOAL #1   Title  Patient to be independent with advanced HEP.    Status  New    Target Date  08/22/19      PT LONG TERM GOAL #2   Title  Patient will improve lumbar to ROM to >/=50% w/o limitation due to pain    Status  New    Target Date  08/22/19       PT LONG TERM GOAL #3   Title  Patient to demonstrate >/= 4 to 4+/5 strength in B LEs for improved stablity.    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 Patient felt like his pain originated from his hip, but states MD told him it was likely from his back. Lumbar ROM limited in all planes with extension and B rotation severely limited although pain only reported with extension and R lateral flexion. Moderate limited flexibility present throughout proximal musculature with significant ttp reported in R glutes and piriformis. Mild to moderate weakness noted in R LE with pain noted on all resisted motions of hip and knee except hip abduction. Pain interferes with daily mobility and his work in Technical sales engineer business, with patient reporting pain during evaluation causing him to be incontinent of urine. Patient also noted to demonstrate some gait instability with 2 LOB while walking through clinic to treatment room - he may also benefit from further balance assessment if further instability  noted.  Starling will benefit from skilled PT to address above deficits to restore normal muscle tension and reduce or eliminate LBP/buttock pain.    Personal Factors and Comorbidities  Comorbidity 3+;Fitness;Time since onset of injury/illness/exacerbation    Comorbidities  OA s/p B TKA, prostate cancer, HTN     Examination-Activity Limitations  Bend;Continence;Lift;Locomotion Level;Sit;Stand;Transfers    Examination-Participation Restrictions  Yard Work;Community Activity    Stability/Clinical Decision Making  Evolving/Moderate complexity    Clinical Decision Making  Moderate    Rehab Potential  Good    PT Frequency  2x / week    PT Duration  8 weeks    PT Treatment/Interventions  ADLs/Self Care Home Management;Cryotherapy;Electrical Stimulation;Iontophoresis 4mg /ml Dexamethasone;Moist Heat;Traction;Ultrasound;Gait training;Functional mobility training;Therapeutic activities;Therapeutic exercise;Balance training;Neuromuscular re-education;Patient/family education;Manual techniques;Passive range of motion;Dry needling;Taping;Spinal Manipulations;Joint Manipulations    PT Next Visit Plan  Review initial HEP; lumbopelvic stretching and strengthening; manual STM to R glutes/piriformis; modalities PRN    PT Home Exercise Plan  06/27/19 - [place] stretches, abdominal bracing/pelvic tilt, bridging    Consulted and Agree with Plan of Care  Patient       Patient will benefit from skilled therapeutic intervention in order to improve the following deficits and impairments:  Abnormal gait, Decreased activity tolerance, Decreased balance, Decreased coordination, Decreased endurance, Decreased mobility, Decreased range of motion, Decreased safety awareness, Decreased strength, Increased muscle spasms, Impaired perceived functional ability, Impaired flexibility, Improper body mechanics, Postural dysfunction, Pain  Visit Diagnosis: Acute right-sided low back pain with right-sided sciatica  Other muscle spasm  Muscle weakness (generalized)  Difficulty in walking, not elsewhere classified  Unsteadiness on feet     Problem List Patient Active Problem List   Diagnosis Date Noted  . Syncope 08/30/2018  . Essential hypertension 08/30/2018  . AKI (acute kidney injury) ([place]) 08/30/2018  . Hyponatremia  08/30/2018  . Volume depletion 08/30/2018  . OA (  osteoarthritis) of knee 09/10/2015    Percival Spanish, PT, MPT 06/27/2019, 7:04 PM  [facility] [address]  [place], [place], [address] Phone: [phone]   Fax:  [phone]  Name: Seth [name] MRN: [id number] Date of Birth: Dec 27, 1940

## 2019-07-01 ENCOUNTER — Other Ambulatory Visit: Payer: Self-pay

## 2019-07-01 ENCOUNTER — Ambulatory Visit: Payer: Medicare Other

## 2019-07-01 VITALS — BP 140/78 | HR 96

## 2019-07-01 DIAGNOSIS — M6281 Muscle weakness (generalized): Secondary | ICD-10-CM

## 2019-07-01 DIAGNOSIS — M5441 Lumbago with sciatica, right side: Secondary | ICD-10-CM

## 2019-07-01 DIAGNOSIS — R2681 Unsteadiness on feet: Secondary | ICD-10-CM

## 2019-07-01 DIAGNOSIS — R262 Difficulty in walking, not elsewhere classified: Secondary | ICD-10-CM

## 2019-07-01 DIAGNOSIS — M62838 Other muscle spasm: Secondary | ICD-10-CM

## 2019-07-01 NOTE — Therapy (Signed)
[facility] [address]  [name], [place], [address] Phone: [phone]   Fax:  [phone]  Physical Therapy Treatment  Patient Details  Name: STEPFON MEYEROWITZ MRN: [id number] Date of Birth: 03/01/1941 Referring Provider (PT): Gerrit Halls, PA-C   Encounter Date: 07/01/2019  PT End of Session - 07/01/19 0900    Visit Number  2    Number of Visits  16    Date for PT Re-Evaluation  08/22/19    Authorization Type  UHC Medicare [place]: [place]    PT Start Time  [id number]    PT Stop Time  0938   Ended visit with 10 min moist heat   PT Time Calculation (min)  52 min    Activity Tolerance  Patient tolerated treatment well    Behavior During Therapy  [facility] for tasks assessed/performed       Past Medical History:  Diagnosis Date  . Arthritis   . Cancer (HCC)    hx of prostate cancer   . History of kidney stones   . Hypertension   . Pneumonia    06/2015     Past Surgical History:  Procedure Laterality Date  . PROSTATECTOMY    . 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   Procedure: TOTAL KNEE ARTHROPLASTY;  Surgeon: Gaynelle Arabian, MD;  Location: [facility];  Service: Orthopedics;  Laterality: Left;  26min    Vitals:   07/01/19 0853 07/01/19 0902  BP: 140/78   Pulse: 96 96  SpO2: 97% 94%    Subjective Assessment - 07/01/19 0853    Subjective  Pt. reporting he is trying to get more pain medications from MD.    Pertinent History  B TKA    Diagnostic tests  Lumbar x-ray: Per MD note, X-rays of his lumbar spine show a slight list to the left. On the lateral view he does have some mild degenerative disc changes at L4-5 and L5-S1 with some foraminal stenosis and grade 1 slips of L3-4, 4-5.    Patient Stated Goals  "become more mobile"    Currently in  Pain?  Yes    Pain Score  7     Pain Location  Buttocks    Pain Orientation  Right    Pain Descriptors / Indicators  Aching    Pain Type  Acute pain    Pain Radiating Towards  down posterior R leg to calf    Pain Onset  More than a month ago    Pain Frequency  Intermittent                       [facility] Adult PT Treatment/Exercise - 07/01/19 0001      Lumbar Exercises: Stretches   Single Knee to Chest Stretch  Right;Left;1 rep;30 seconds   cues required for proper techniqeu    Single Knee to Chest Stretch Limitations  towel assist    Piriformis Stretch  Right;30 seconds;1 rep   minor cueing required for towel placement  - cues not bounce   Piriformis Stretch Limitations  KTOS - towel assist    Figure 4 Stretch  1 rep;30 seconds;Supine    Figure 4 Stretch Limitations  Manual with therapist       Lumbar Exercises: Aerobic   Nustep  Lvl 1, 6 min (LE only)      Lumbar Exercises: Supine   Ab Set  10 reps;5 seconds    AB Set Limitations  cues to not hold breath    Pelvic Tilt  5 seconds;10 reps    Pelvic Tilt Limitations  cues to avoid holding breath and for proper technique - limited ROM     Bridge  10 reps;3 seconds    Bridge Limitations  limited lift      Manual Therapy   Manual Therapy  Soft tissue mobilization;Myofascial release    Manual therapy comments  sidelying with R LE elevated on bolster     Soft tissue mobilization  STM/DTM to R glute med, piriformis, glute max    Myofascial Release  TPR to R glute med               PT Short Term Goals - 07/01/19 0900      PT SHORT TERM GOAL #1   Title  Patient will be independent with initial HEP    Status  On-going    Target Date  07/18/19      PT SHORT TERM GOAL #2   Title  Formally assess balance if continued instability noted    Baseline  patient demonstrating LOB upon walking through clinic to treatment room    Status  On-going    Target Date  07/18/19        PT Long Term Goals - 07/01/19  0901      PT LONG TERM GOAL #1   Title  Patient to be independent with advanced HEP.    Status  On-going      PT LONG TERM GOAL #2   Title  Patient will improve lumbar to ROM to >/=50% w/o limitation due to pain    Status  On-going      PT LONG TERM GOAL #3   Title  Patient to demonstrate >/= 4 to 4+/5 strength in B LEs for improved stablity.    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 reporting he tried HEP yesterday and had to stop due to LBP.  Unable to verbalize which of the initial HEP exercises he had pain with.  Required heavy cueing throughout HEP review today to avoid holding breath during all activities, for towel placement to assist with [facility] stretch and to avoid painful upper ROM with bridging.  Did demo somewhat improved understanding of HEP by end of session however will likely require further skilled instruction for proper technique.  Encouraged pt. to keep [facility] with him throughout session today as he demonstrates poor LE clearance and has tendency to try to leave [facility] and walk without AD.  Will continue to progress with gentle lumbopelvic flexibly and strengthening per pt. in coming sessions.    Personal Factors and Comorbidities  Comorbidity 3+;Fitness;Time since onset of injury/illness/exacerbation    Comorbidities  OA s/p B TKA, prostate cancer, HTN    Rehab  Potential  Good    PT Treatment/Interventions  ADLs/Self Care Home Management;Cryotherapy;Electrical Stimulation;Iontophoresis 4mg /ml Dexamethasone;Moist Heat;Traction;Ultrasound;Gait training;Functional mobility training;Therapeutic  activities;Therapeutic exercise;Balance training;Neuromuscular re-education;Patient/family education;Manual techniques;Passive range of motion;Dry needling;Taping;Spinal Manipulations;Joint Manipulations    PT Next Visit Plan  Further review initial HEP prn; lumbopelvic stretching and strengthening; further manual STM to R glutes/piriformis prn; modalities PRN    PT Home Exercise Plan  06/27/19 - [place] stretches, abdominal bracing/pelvic tilt, bridging    Consulted and Agree with Plan of Care  Patient       Patient will benefit from skilled therapeutic intervention in order to improve the following deficits and impairments:  Abnormal gait, Decreased activity tolerance, Decreased balance, Decreased coordination, Decreased endurance, Decreased mobility, Decreased range of motion, Decreased safety awareness, Decreased strength, Increased muscle spasms, Impaired perceived functional ability, Impaired flexibility, Improper body mechanics, Postural dysfunction, Pain  Visit Diagnosis: Acute right-sided low back pain with right-sided sciatica  Other muscle spasm  Muscle weakness (generalized)  Difficulty in walking, not elsewhere classified  Unsteadiness on feet     Problem List Patient Active Problem List   Diagnosis Date Noted  . Syncope 08/30/2018  . Essential hypertension 08/30/2018  . AKI (acute kidney injury) ([place]) 08/30/2018  . Hyponatremia 08/30/2018  . Volume depletion 08/30/2018  . OA (osteoarthritis) of knee 09/10/2015    Bess Harvest, PTA 07/01/19 12:52 PM   [facility] [address]  [place], [place], [address] Phone: [phone]   Fax:  [phone]  Name: MARK GAVIA MRN: [id number] Date of Birth: 26-Jan-1941

## 2019-07-05 ENCOUNTER — Ambulatory Visit: Payer: Medicare Other

## 2019-07-05 ENCOUNTER — Other Ambulatory Visit: Payer: Self-pay

## 2019-07-05 VITALS — BP 142/74 | HR 100

## 2019-07-05 DIAGNOSIS — M62838 Other muscle spasm: Secondary | ICD-10-CM

## 2019-07-05 DIAGNOSIS — M5441 Lumbago with sciatica, right side: Secondary | ICD-10-CM

## 2019-07-05 DIAGNOSIS — M6281 Muscle weakness (generalized): Secondary | ICD-10-CM

## 2019-07-05 DIAGNOSIS — R2681 Unsteadiness on feet: Secondary | ICD-10-CM

## 2019-07-05 DIAGNOSIS — R262 Difficulty in walking, not elsewhere classified: Secondary | ICD-10-CM

## 2019-07-05 NOTE — Therapy (Signed)
[facility] [address]  [place], [place], [address] Phone: [phone]   Fax:  [phone]  Physical Therapy Treatment  Patient Details  Name: Seth [name] MRN: [id number] Date of Birth: 08/04/1941 Referring Provider (PT): Gerrit Halls, PA-C   Encounter Date: 07/05/2019  PT End of Session - 07/05/19 0901    Visit Number  3    Number of Visits  16    Date for PT Re-Evaluation  08/22/19    Authorization Type  UHC Medicare [place]    PT Start Time  [id number]    PT Stop Time  0951   Ended visit with 10 min moist heat in sitting   PT Time Calculation (min)  61 min    Activity Tolerance  Patient tolerated treatment well    Behavior During Therapy  [facility] for tasks assessed/performed       Past Medical History:  Diagnosis Date  . Arthritis   . Cancer (HCC)    hx of prostate cancer   . History of kidney stones   . Hypertension   . Pneumonia    06/2015     Past Surgical History:  Procedure Laterality Date  . PROSTATECTOMY    . 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   Procedure: TOTAL KNEE ARTHROPLASTY;  Surgeon: Gaynelle Arabian, MD;  Location: [facility];  Service: Orthopedics;  Laterality: Left;  42min    Vitals:   07/05/19 0859  BP: (!) 142/74  Pulse: 100  SpO2: 97%    Subjective Assessment - 07/05/19 0859    Subjective  Pt. denies soreness after last visit.    Pertinent History  B TKA    Diagnostic tests  Lumbar x-ray: Per MD note, X-rays of his lumbar spine show a slight list to the left. On the lateral view he does have some mild degenerative disc changes at L4-5 and L5-S1 with some foraminal stenosis and grade 1 slips of L3-4, 4-5.    Patient Stated Goals  "become more mobile"    Currently in Pain?  Yes    Pain Score  7      Pain Location  Buttocks    Pain Orientation  Right    Pain Descriptors / Indicators  Aching    Pain Type  Acute pain    Pain Radiating Towards  denies today    Pain Onset  More than a month ago    Pain Frequency  Intermittent    Aggravating Factors   unsure    Multiple Pain Sites  No                       [facility] Adult PT Treatment/Exercise - 07/05/19 0001      Lumbar Exercises: Stretches   Passive Hamstring Stretch  Right;1 rep;30 seconds    Passive Hamstring Stretch Limitations  manual with therapist     Single Knee to Chest Stretch  Right;Left;1 rep;30 seconds    Single Knee to Chest Stretch Limitations  manual with therapist     Lower Trunk Rotation Limitations  5" x 10 resp     Lumbar Stabilization Level 1  3 reps;20 seconds    Lumbar Stabilization Level 1 Limitations  seated green p-ball 3-dir roll outs  Lumbar Exercises: Aerobic   Nustep  Lvl 1, 6 min (LE only)      Lumbar Exercises: Seated   Sit to Stand  5 reps   x 2 sets    Sit to Stand Limitations  from mat table       Lumbar Exercises: Supine   Ab Set  10 reps;5 seconds    AB Set Limitations  cues to not hold breath    Bridge  10 reps;3 seconds    Bridge Limitations  mild improved ROM with cueing       Moist Heat Therapy   Number Minutes Moist Heat  15 Minutes   seated    Moist Heat Location  Lumbar Spine;Hip   seated      Manual Therapy   Manual Therapy  Soft tissue mobilization;Myofascial release    Manual therapy comments  sidelying with R LE elevated on bolster     Soft tissue mobilization  STM/DTM to R glute med, piriformis, glute max    Myofascial Release  TPR to R glute med, R piriformis              PT Education - 07/05/19 1810    Education Details  HEP update;  seated piriformis stretch    Person(s) Educated  Patient    Methods  Explanation;Demonstration;Verbal cues;Handout    Comprehension  Verbalized understanding;Returned demonstration;Verbal cues required        PT Short Term Goals - 07/01/19 0900      PT SHORT TERM GOAL #1   Title  Patient will be independent with initial HEP    Status  On-going    Target Date  07/18/19      PT SHORT TERM GOAL #2   Title  Formally assess balance if continued instability noted    Baseline  patient demonstrating LOB upon walking through clinic to treatment room    Status  On-going    Target Date  07/18/19        PT Long Term Goals - 07/01/19 0901      PT LONG TERM GOAL #1   Title  Patient to be independent with advanced HEP.    Status  On-going      PT LONG TERM GOAL #2   Title  Patient will improve lumbar to ROM to >/=50% w/o limitation due to pain    Status  On-going      PT LONG TERM GOAL #3   Title  Patient to demonstrate >/= 4 to 4+/5 strength in B LEs for improved stablity.    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  Seth [name] reporting he has been performing HEP "everyday" which he actually feels has improved his buttocks/back pain.  MT did address ongoing tenderness in R buttocks today which seemed to improve significantly after LE stretching with therapist and MT.  Ended visit with pt. issued alternative version  of glute/piriformis stretch in seated position via handout (see pt. instruction section).  Pt. continue to progress lumbopelvic strengthening and stretching per pt. tolerance in coming sessions.    Comorbidities  OA s/p B TKA, prostate cancer, HTN    Rehab Potential  Good    PT Treatment/Interventions  ADLs/Self Care Home  Management;Cryotherapy;Electrical Stimulation;Iontophoresis 4mg /ml Dexamethasone;Moist Heat;Traction;Ultrasound;Gait training;Functional mobility training;Therapeutic activities;Therapeutic exercise;Balance training;Neuromuscular re-education;Patient/family education;Manual techniques;Passive range of motion;Dry needling;Taping;Spinal Manipulations;Joint Manipulations    PT Next Visit Plan  Further review initial HEP prn; lumbopelvic stretching and strengthening; further manual STM to R glutes/piriformis prn; modalities PRN    PT Home Exercise Plan  06/27/19 - [place] stretches, abdominal bracing/pelvic tilt, bridging, seated modified piriformis stretch    Consulted and Agree with Plan of Care  Patient       Patient will benefit from skilled therapeutic intervention in order to improve the following deficits and impairments:  Abnormal gait, Decreased activity tolerance, Decreased balance, Decreased coordination, Decreased endurance, Decreased mobility, Decreased range of motion, Decreased safety awareness, Decreased strength, Increased muscle spasms, Impaired perceived functional ability, Impaired flexibility, Improper body mechanics, Postural dysfunction, Pain  Visit Diagnosis: Acute right-sided low back pain with right-sided sciatica  Other muscle spasm  Muscle weakness (generalized)  Difficulty in walking, not elsewhere classified  Unsteadiness on feet     Problem List Patient Active Problem List   Diagnosis Date Noted  . Syncope 08/30/2018  . Essential hypertension 08/30/2018  . AKI (acute kidney injury) ([place]) 08/30/2018  . Hyponatremia 08/30/2018  . Volume depletion 08/30/2018  . OA (osteoarthritis) of knee 09/10/2015    Bess Harvest, PTA 07/05/19 6:15 PM   [facility] [address]  [place], [place], [address] Phone: [phone]   Fax:  [phone]  Name: Seth [name] MRN: [id number] Date of Birth:  [birth year]

## 2019-07-12 ENCOUNTER — Other Ambulatory Visit: Payer: Self-pay

## 2019-07-12 ENCOUNTER — Ambulatory Visit: Payer: Medicare Other | Attending: Physician Assistant

## 2019-07-12 VITALS — BP 134/78 | HR 87

## 2019-07-12 DIAGNOSIS — M5441 Lumbago with sciatica, right side: Secondary | ICD-10-CM | POA: Diagnosis present

## 2019-07-12 DIAGNOSIS — R2681 Unsteadiness on feet: Secondary | ICD-10-CM | POA: Insufficient documentation

## 2019-07-12 DIAGNOSIS — M62838 Other muscle spasm: Secondary | ICD-10-CM | POA: Insufficient documentation

## 2019-07-12 DIAGNOSIS — M6281 Muscle weakness (generalized): Secondary | ICD-10-CM | POA: Insufficient documentation

## 2019-07-12 DIAGNOSIS — R262 Difficulty in walking, not elsewhere classified: Secondary | ICD-10-CM | POA: Diagnosis present

## 2019-07-12 NOTE — Therapy (Signed)
[facility] [address]  [place], [place], [address] Phone: [phone]   Fax:  [phone]  Physical Therapy Treatment  Patient Details  Name: Seth [name] MRN: [id number] Date of Birth: Jul 09, 1941 Referring Provider (PT): Gerrit Halls, PA-C   Encounter Date: 07/12/2019  PT End of Session - 07/12/19 0900    Visit Number  4    Number of Visits  16    Date for PT Re-Evaluation  08/22/19    Authorization Type  UHC Medicare [place]    PT Start Time  7412    PT Stop Time  0925    PT Time Calculation (min)  38 min    Activity Tolerance  Patient tolerated treatment well    Behavior During Therapy  [facility] for tasks assessed/performed       Past Medical History:  Diagnosis Date  . Arthritis   . Cancer (HCC)    hx of prostate cancer   . History of kidney stones   . Hypertension   . Pneumonia    06/2015     Past Surgical History:  Procedure Laterality Date  . PROSTATECTOMY    . 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   Procedure: TOTAL KNEE ARTHROPLASTY;  Surgeon: Gaynelle Arabian, MD;  Location: [facility];  Service: Orthopedics;  Laterality: Left;  75mn    Vitals:   07/12/19 0857  BP: 134/78  Pulse: 87  SpO2: 97%    Subjective Assessment - 07/12/19 0857    Subjective  Pt. reporting some R lateral hip soreness today.  Notes he had to crawl under the house on Sunday (07/10/19) to fix a leaky pipe.  Notes he was under there for 45 min and "had to crawl out".  Pt. notes, "I wasn't sure I was going to get out of there".    Pertinent History  B TKA    Diagnostic tests  Lumbar x-ray: Per MD note, X-rays of his lumbar spine show a slight list to the left. On the lateral view he does have some mild degenerative disc changes at L4-5 and L5-S1  with some foraminal stenosis and grade 1 slips of L3-4, 4-5.    Patient Stated Goals  "become more mobile"    Currently in Pain?  Yes    Pain Score  4     Pain Location  Buttocks    Pain Orientation  Right    Pain Descriptors / Indicators  Aching    Pain Type  Acute pain    Pain Onset  More than a month ago    Aggravating Factors   Unsure    Multiple Pain Sites  No                       [facility] Adult PT Treatment/Exercise - 07/12/19 0001      Lumbar Exercises: Stretches   Passive Hamstring Stretch  Right;1 rep;30 seconds    Passive Hamstring Stretch Limitations  manual with therapist     Lumbar Stabilization Level 1  3 reps;20 seconds    Lumbar Stabilization Level 1 Limitations  seated green p-ball 3-dir roll outs     Other Lumbar Stretch Exercise  Modified Piriformis stretch seated on edge of table with LE resting on table x 30 sec  each side       Lumbar Exercises: Aerobic   Nustep  Lvl 3, 6 min (LE only)      Lumbar Exercises: Seated   Sit to Stand  5 reps    Sit to Stand Limitations  2x5 reps from mat table       Lumbar Exercises: Supine   Bridge  3 seconds   x 12 reps; cues to avoid holding breath   Bridge Limitations  near full ROM today      Lumbar Exercises: Sidelying   Clam  Right;Left;10 reps;3 seconds    Clam Limitations  no ressitance    cues to maintain sidellying      Moist Heat Therapy   Number Minutes Moist Heat  10 Minutes    Moist Heat Location  Hip   seated R hip     Manual Therapy   Manual Therapy  Soft tissue mobilization;Myofascial release    Manual therapy comments  sitting     Soft tissue mobilization  STM/DTM to R lateral glutes, glute med    Myofascial Release  TPR to R glute med             PT Education - 07/12/19 1243    Education Details  HEP update;  sit<>stand with UE pushoff, sidelying clam shell (no resistance)    Person(s) Educated  Patient    Methods  Explanation;Verbal cues;Handout;Demonstration     Comprehension  Verbalized understanding;Returned demonstration;Verbal cues required       PT Short Term Goals - 07/12/19 0923      PT SHORT TERM GOAL #1   Title  Patient will be independent with initial HEP    Status  Partially Met   limited ability to verbalize exercises however much improved demonstration with review   Target Date  07/18/19      PT SHORT TERM GOAL #2   Title  Formally assess balance if continued instability noted    Baseline  patient demonstrating LOB upon walking through clinic to treatment room    Status  On-going    Target Date  07/18/19        PT Long Term Goals - 07/01/19 0901      PT LONG TERM GOAL #1   Title  Patient to be independent with advanced HEP.    Status  On-going      PT LONG TERM GOAL #2   Title  Patient will improve lumbar to ROM to >/=50% w/o limitation due to pain    Status  On-going      PT LONG TERM GOAL #3   Title  Patient to demonstrate >/= 4 to 4+/5 strength in B LEs for improved stablity.    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 reporting he "crawled under the house" on Sunday to fix a leaky pipe.  Notes, "I had to crawl out of there, wasn't sure if I was going to get out".  Presenting  with improved buttocks pain back pain reports however with R lateral hip/glute med tenderness which bothered him with supine, seated therex today and may be linked to "crawling"/laying on R side under house two days ago.  MT therapy addressing R glute med with STM and pt. instructed in gentle stretching for this area.  STG #1 partially met as pt. able to perform  initial HEP with good overall technique with exception of pelvic tilt exercise which pt. has difficulty understanding despite multiple instruction.  Does frequently require cueing with therex for proper ROM and technique however seems to tolerate supine/seated therex well in session.  Pt. continues with visible instability ambulating into session with SPC and may benefit from balance assessment in coming sessions. Will benefit from further skilled therapy to maximize functional mobility and tolerance for functional tasks.    Comorbidities  OA s/p B TKA, prostate cancer, HTN    Rehab Potential  Good    PT Treatment/Interventions  ADLs/Self Care Home Management;Cryotherapy;Electrical Stimulation;Iontophoresis 68m/ml Dexamethasone;Moist Heat;Traction;Ultrasound;Gait training;Functional mobility training;Therapeutic activities;Therapeutic exercise;Balance training;Neuromuscular re-education;Patient/family education;Manual techniques;Passive range of motion;Dry needling;Taping;Spinal Manipulations;Joint Manipulations    PT Next Visit Plan  Further review initial HEP prn; lumbopelvic stretching and strengthening; further manual STM to R glutes/piriformis prn; modalities PRN    PT Home Exercise Plan  06/27/19 - [place], abdominal bracing/pelvic tilt, bridging, seated modified piriformis stretch;  07/12/19 - sit<>stand with UE pushoff, sidelying clam shell (no resistance)    Consulted and Agree with Plan of Care  Patient       Patient will benefit from skilled therapeutic intervention in order to improve the following deficits and impairments:  Abnormal gait, Decreased activity tolerance, Decreased balance, Decreased coordination, Decreased endurance, Decreased mobility, Decreased range of motion, Decreased safety awareness, Decreased strength, Increased muscle spasms, Impaired perceived functional ability, Impaired flexibility, Improper body mechanics, Postural dysfunction, Pain  Visit  Diagnosis: Acute right-sided low back pain with right-sided sciatica  Other muscle spasm  Muscle weakness (generalized)  Difficulty in walking, not elsewhere classified  Unsteadiness on feet     Problem List Patient Active Problem List   Diagnosis Date Noted  . Syncope 08/30/2018  . Essential hypertension 08/30/2018  . AKI (acute kidney injury) ([place] 08/30/2018  . Hyponatremia 08/30/2018  . Volume depletion 08/30/2018  . OA (osteoarthritis) of knee 09/10/2015    MBess Harvest PTA 07/12/19 12:45 PM   [facility] [address] [place] [address]Phone: [phone]  Fax:  [phone] Name: Seth PALINKASMRN: [id number] of Birth: [birth year]

## 2019-07-15 ENCOUNTER — Other Ambulatory Visit: Payer: Self-pay

## 2019-07-15 ENCOUNTER — Ambulatory Visit: Payer: Medicare Other | Admitting: Physical Therapy

## 2019-07-15 ENCOUNTER — Encounter: Payer: Self-pay | Admitting: Physical Therapy

## 2019-07-15 VITALS — BP 140/100 | HR 113

## 2019-07-15 DIAGNOSIS — R262 Difficulty in walking, not elsewhere classified: Secondary | ICD-10-CM

## 2019-07-15 DIAGNOSIS — M62838 Other muscle spasm: Secondary | ICD-10-CM

## 2019-07-15 DIAGNOSIS — M5441 Lumbago with sciatica, right side: Secondary | ICD-10-CM | POA: Diagnosis not present

## 2019-07-15 DIAGNOSIS — M6281 Muscle weakness (generalized): Secondary | ICD-10-CM

## 2019-07-15 DIAGNOSIS — R2681 Unsteadiness on feet: Secondary | ICD-10-CM

## 2019-07-15 NOTE — Patient Instructions (Signed)

## 2019-07-15 NOTE — Therapy (Signed)
[facility] [address]  [place], [place], [address] Phone: [phone]   Fax:  [phone]  Physical Therapy Treatment  Patient Details  Name: Seth [name] MRN: [id number] Date of Birth: 1941/06/11 Referring Provider (PT): Gerrit Halls, PA-C   Encounter Date: 07/15/2019  PT End of Session - 07/15/19 0852    Visit Number  5    Number of Visits  16    Date for PT Re-Evaluation  08/22/19    Authorization Type  UHC Medicare [place]    PT Start Time  0852    PT Stop Time  0943    PT Time Calculation (min)  51 min    Activity Tolerance  Patient tolerated treatment well    Behavior During Therapy  [facility] for tasks assessed/performed       Past Medical History:  Diagnosis Date  . Arthritis   . Cancer (HCC)    hx of prostate cancer   . History of kidney stones   . Hypertension   . Pneumonia    06/2015     Past Surgical History:  Procedure Laterality Date  . PROSTATECTOMY    . 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   Procedure: TOTAL KNEE ARTHROPLASTY;  Surgeon: Gaynelle Arabian, MD;  [facility]: [facility];  Service: Orthopedics;  Laterality: Left;  43mn    Vitals:   07/15/19 0902  BP: (!) 140/100  Pulse: (!) 113  SpO2: 96%    Subjective Assessment - 07/15/19 0904    Subjective  Pt reporting increased scaitic pain on R after riding the lawnmower yesterday.    Pertinent History  B TKA    Diagnostic tests  Lumbar x-ray: Per MD note, X-rays of his lumbar spine show a slight list to the left. On the lateral view he does have some mild degenerative disc changes at L4-5 and L5-S1 with some foraminal stenosis and grade 1 slips of L3-4, 4-5.    Patient Stated Goals  "become more mobile"    Currently in Pain?  Yes    Pain Score  7     Pain  Location  Buttocks    Pain Orientation  Right    Pain Onset  More than a month ago                       [facility] 0001      Modalities   Modalities  Electrical Stimulation;Moist Heat      Moist Heat Therapy   Number Minutes Moist Heat  15 Minutes    Moist Heat Location  Hip   R hip & buttock (L side lying)     Electrical Stimulation   Electrical Stimulation Location  R hip & buttock    Electrical Stimulation Action  IFC    Electrical Stimulation Parameters  80-150 Hz, intensity to pt tolerance x 15'    Electrical Stimulation Goals  Pain      Manual Therapy   Manual therapy comments  L side lying pillow btw knees    Soft tissue mobilization  STM/DTM to R glutes & piriformis - very ttp with increased muscle tension noted    Myofascial Release  manual TPR to R glute mediius/minimus & piriformis  PT Education - 07/15/19 0935    Education Details  Role of DN in addressing abnormal muscle tension and radicular pain    Person(s) Educated  Patient    Methods  Explanation;Handout    Comprehension  Verbalized understanding       PT Short Term Goals - 07/12/19 0923      PT SHORT TERM GOAL #1   Title  Patient will be independent with initial HEP    Status  Partially Met   limited ability to verbalize exercises however much improved demonstration with review   Target Date  07/18/19      PT SHORT TERM GOAL #2   Title  Formally assess balance if continued instability noted    Baseline  patient demonstrating LOB upon walking through clinic to treatment room    Status  On-going    Target Date  07/18/19        PT Long Term Goals - 07/01/19 0901      PT LONG TERM GOAL #1   Title  Patient to be independent with advanced HEP.    Status  On-going      PT LONG TERM GOAL #2   Title  Patient will improve lumbar to ROM to >/=50% w/o limitation due to pain    Status  On-going      PT LONG TERM GOAL #3   Title   Patient to demonstrate >/= 4 to 4+/5 strength in B LEs for improved stablity.    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 VS checked on arrival to PT with DBP significantly elevated (140/100) and HR in 110s at rest, therefore deferred aerobic and strengthening exercises and focused therapy session on manual STM/DTM, MFR and manual TPR to reduce R buttock pain and sciatica - able to reproduce sciatic pain with manual TPR to R glute minimus/medius and lateral piriformis with patient noting reduction in pain following manual therapy. Patient may benefit from DN if abnormal muscle tension persists, thus patient provided with educational information regarding DN. Treatment concluded with estim and moist heat to promoted further muscle relaxation and pain reduction. Patient would still benefit from balance assessment once vital signs and pain allow.    Comorbidities  OA s/p B TKA, prostate cancer, HTN    Rehab Potential  Good    PT Frequency  2x / week    PT Duration  8 weeks    PT Treatment/Interventions  ADLs/Self Care Home Management;Cryotherapy;Electrical Stimulation;Iontophoresis 47m/ml Dexamethasone;Moist Heat;Traction;Ultrasound;Gait training;Functional mobility training;Therapeutic activities;Therapeutic exercise;Balance training;Neuromuscular re-education;Patient/family education;Manual techniques;Passive range of motion;Dry needling;Taping;Spinal Manipulations;Joint Manipulations    PT Next Visit Plan  Further review initial HEP prn;  lumbopelvic stretching and strengthening; manual STM to R glutes/piriformis as indicated including DN prn; modalities prn; balance assessment    PT Home Exercise Plan  06/27/19 - [place]retches, abdominal bracing/pelvic tilt, bridging, seated modified piriformis stretch;  07/12/19 - sit<>stand with UE pushoff, sidelying clam shell (no resistance)    Consulted and Agree with Plan of Care  Patient       Patient will benefit from skilled therapeutic intervention in order to improve the following deficits and impairments:  Abnormal gait, Decreased activity tolerance, Decreased balance, Decreased coordination, Decreased endurance, Decreased mobility, Decreased range of motion, Decreased safety awareness, Decreased strength, Increased muscle spasms, Impaired perceived functional ability, Impaired flexibility, Improper body mechanics, Postural dysfunction, Pain  Visit Diagnosis: Acute right-sided low back pain with right-sided sciatica  Other muscle spasm  Muscle weakness (generalized)  Difficulty in walking, not elsewhere classified  Unsteadiness on feet     Problem List Patient Active Problem List   Diagnosis Date Noted  . Syncope 08/30/2018  . Essential hypertension 08/30/2018  . AKI (acute kidney injury) ([place]) 08/30/2018  . Hyponatremia 08/30/2018  . Volume depletion 08/30/2018  . OA (osteoarthritis) of knee 09/10/2015    Percival Spanish, PT, MPT 07/15/2019, 1:51 PM  [facility] [address]  [place], [place], [address] Phone: [phone]   Fax:  [phone]  Name: JOSHAUA EPPLE MRN: [id number] Date of Birth: 1941/02/08

## 2019-07-19 ENCOUNTER — Ambulatory Visit: Payer: Medicare Other

## 2019-07-19 VITALS — BP 148/88 | HR 106

## 2019-07-19 DIAGNOSIS — R2681 Unsteadiness on feet: Secondary | ICD-10-CM

## 2019-07-19 DIAGNOSIS — M5441 Lumbago with sciatica, right side: Secondary | ICD-10-CM

## 2019-07-19 DIAGNOSIS — M62838 Other muscle spasm: Secondary | ICD-10-CM

## 2019-07-19 DIAGNOSIS — M6281 Muscle weakness (generalized): Secondary | ICD-10-CM

## 2019-07-19 DIAGNOSIS — R262 Difficulty in walking, not elsewhere classified: Secondary | ICD-10-CM

## 2019-07-19 NOTE — Therapy (Signed)
[facility] [address]  [place], [place], [address] Phone: [phone]   Fax:  [phone]  Physical Therapy Treatment  Patient Details  Name: Seth [name] MRN: [id number] Date of Birth: 1941-05-13 Referring Provider (PT): Gerrit Halls, PA-C   Encounter Date: 07/19/2019  PT End of Session - 07/19/19 0905    Visit Number  6    Number of Visits  16    Date for PT Re-Evaluation  08/22/19    Authorization Type  [employer] Medicare [place]    PT Start Time  0849    PT Stop Time  0943   15 min moist heat to end session   PT Time Calculation (min)  54 min    Activity Tolerance  Patient tolerated treatment well    Behavior During Therapy  [facility] for tasks assessed/performed       Past Medical History:  Diagnosis Date  . Arthritis   . Cancer (HCC)    hx of prostate cancer   . History of kidney stones   . Hypertension   . Pneumonia    06/2015     Past Surgical History:  Procedure Laterality Date  . PROSTATECTOMY    . 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   Procedure: TOTAL KNEE ARTHROPLASTY;  Surgeon: Gaynelle Arabian, MD;  Location: [facility];  Service: Orthopedics;  Laterality: Left;  40mn    Vitals:   07/19/19 0854 07/19/19 0858  BP: (!) 148/88   Pulse: (!) 115 (!) 106  SpO2: 95% 94%    Subjective Assessment - 07/19/19 0854    Subjective  Pt. reporting he felt better afte rlast visit.  Had less pain for remainder of day however does have some remaining R hip pain today.    Pertinent History  B TKA    Diagnostic tests  Lumbar x-ray: Per MD note, X-rays of his lumbar spine show a slight list to the left. On the lateral view he does have some mild degenerative disc changes at L4-5 and L5-S1 with some foraminal stenosis and grade 1 slips  of L3-4, 4-5.    Patient Stated Goals  "become more mobile"    Currently in Pain?  Yes    Pain Score  8     Pain Location  Buttocks    Pain Orientation  Right    Pain Descriptors / Indicators  Aching    Pain Type  Acute pain    Pain Onset  More than a month ago    Pain Frequency  Intermittent    Aggravating Factors   sitting and putting more pressure on R side    Pain Relieving Factors  sitting up straight in chair    Multiple Pain Sites  No                       [facility] Adult PT Treatment/Exercise - 07/19/19 0001      Lumbar Exercises: Stretches   Passive Hamstring Stretch  Right;1 rep;30 seconds    Passive Hamstring Stretch Limitations  manual with therapist     Piriformis Stretch  Right;30 seconds;1 rep    Piriformis Stretch Limitations  manual with therapist KTOS    Figure 4 Stretch  1 rep;30 seconds;Supine    Figure 4 Stretch Limitations  Manual with therapist       Moist Heat Therapy   Number Minutes Moist Heat  15 Minutes    Moist Heat Location  Hip   R glutes     Manual Therapy   Manual Therapy  Soft tissue mobilization;Myofascial release;Taping    Manual therapy comments  L side lying with LE resting on bolster     Soft tissue mobilization  STM/DTM to R mid glute max, glute med & piriformis - very ttp with increased muscle tension noted    Myofascial Release  manual TPR to R glute mediius/minimus & piriformis    [employer]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 attributes to getting scratched by barbed wire while riding his lawn mower               PT Short Term Goals - 07/12/19 0923      PT SHORT TERM GOAL #1   Title  Patient will be independent with initial HEP    Status  Partially Met   limited ability to verbalize exercises however much improved demonstration with review   Target Date  07/18/19      PT SHORT TERM GOAL #2   Title   Formally assess balance if continued instability noted    Baseline  patient demonstrating LOB upon walking through clinic to treatment room    Status  On-going    Target Date  07/18/19        PT Long Term Goals - 07/01/19 0901      PT LONG TERM GOAL #1   Title  Patient to be independent with advanced HEP.    Status  On-going      PT LONG TERM GOAL #2   Title  Patient will improve lumbar to ROM to >/=50% w/o limitation due to pain    Status  On-going      PT LONG TERM GOAL #3   Title  Patient to demonstrate >/= 4 to 4+/5 strength in B LEs for improved stablity.    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  Primary complaint was R glute tenderness thus treatment avoiding physical exertion and instead focused on STM/DTM to R glute, trial of K-taping, and passive LE stretching with therapist.  Pt. tolerated session well.  As vitals not therapeutic, session somewhat limited today.  Will monitor response to K-taping and progress per pt. in coming sessions.    Comorbidities  OA s/p B TKA, prostate cancer, HTN    Rehab Potential  Good    PT Treatment/Interventions  ADLs/Self Care Home Management;Cryotherapy;Electrical Stimulation;Iontophoresis 66m/ml Dexamethasone;Moist Heat;Traction;Ultrasound;Gait training;Functional mobility training;Therapeutic activities;Therapeutic exercise;Balance training;Neuromuscular re-education;Patient/family education;Manual techniques;Passive range of motion;Dry needling;Taping;Spinal  Manipulations;Joint Manipulations    PT Next Visit Plan  Monitor vitals; Further review initial HEP prn; lumbopelvic stretching and strengthening; manual STM to R glutes/piriformis as indicated including DN prn; modalities prn; balance assessment    PT Home Exercise Plan  06/27/19 - [place] stretches, abdominal bracing/pelvic tilt, bridging, seated modified piriformis stretch;  07/12/19 - sit<>stand with UE pushoff, sidelying clam shell (no resistance)    Consulted and Agree with Plan of Care  Patient       Patient will benefit from skilled therapeutic intervention in order to improve the following deficits and impairments:  Abnormal gait, Decreased activity tolerance, Decreased balance, Decreased coordination, Decreased endurance, Decreased mobility, Decreased range of motion, Decreased safety awareness, Decreased strength, Increased muscle spasms, Impaired perceived functional ability, Impaired flexibility, Improper body mechanics, Postural dysfunction, Pain  Visit Diagnosis: Acute right-sided low back pain with right-sided sciatica  Other muscle spasm  Muscle weakness (generalized)  Difficulty in walking, not elsewhere classified  Unsteadiness on feet     Problem List Patient Active Problem List   Diagnosis Date Noted  . Syncope 08/30/2018  . Essential hypertension 08/30/2018  . AKI (acute kidney injury) ([place]) 08/30/2018  . Hyponatremia 08/30/2018  . Volume depletion 08/30/2018  . OA (osteoarthritis) of knee 09/10/2015    Bess Harvest, PTA 07/19/19 12:56 PM   [facility] [address]  [place], [place], [address] Phone: [phone]   Fax:  [phone]  Name: SHAUN RUNYON MRN: [id number] Date of Birth: March 04, 1941

## 2019-07-22 ENCOUNTER — Ambulatory Visit: Payer: Medicare Other | Admitting: Physical Therapy

## 2019-07-22 ENCOUNTER — Encounter: Payer: Self-pay | Admitting: Physical Therapy

## 2019-07-22 ENCOUNTER — Other Ambulatory Visit: Payer: Self-pay

## 2019-07-22 VITALS — BP 150/96 | HR 102

## 2019-07-22 DIAGNOSIS — R262 Difficulty in walking, not elsewhere classified: Secondary | ICD-10-CM

## 2019-07-22 DIAGNOSIS — M62838 Other muscle spasm: Secondary | ICD-10-CM

## 2019-07-22 DIAGNOSIS — M5441 Lumbago with sciatica, right side: Secondary | ICD-10-CM | POA: Diagnosis not present

## 2019-07-22 DIAGNOSIS — M6281 Muscle weakness (generalized): Secondary | ICD-10-CM

## 2019-07-22 DIAGNOSIS — R2681 Unsteadiness on feet: Secondary | ICD-10-CM

## 2019-07-22 NOTE — Therapy (Signed)
[facility] [address]  [place], [place], [address] Phone: [phone]   Fax:  [phone]  Physical Therapy Treatment  Patient Details  Name: Seth [name] MRN: [id number] Date of Birth: 05-Oct-1940 Referring Provider (PT): Gerrit Halls, PA-C   Encounter Date: 07/22/2019  PT End of Session - 07/22/19 0852    Visit Number  7    Number of Visits  16    Date for PT Re-Evaluation  08/22/19    Authorization Type  [employer]HC Medicare [place]: [place]    PT Start Time  0852    PT Stop Time  0959    PT Time Calculation (min)  67 min    Activity Tolerance  Patient tolerated treatment well    Behavior During Therapy  [facility] for tasks assessed/performed       Past Medical History:  Diagnosis Date  . Arthritis   . Cancer (HCC)    hx of prostate cancer   . History of kidney stones   . Hypertension   . Pneumonia    06/2015     Past Surgical History:  Procedure Laterality Date  . PROSTATECTOMY    . 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   Procedure: TOTAL KNEE ARTHROPLASTY;  Surgeon: Gaynelle Arabian, MD;  Location: [facility];  Service: Orthopedics;  Laterality: Left;  26mn    Vitals:   07/22/19 0905  BP: (!) 150/96  Pulse: (!) 102  SpO2: 95%    Subjective Assessment - 07/22/19 0906    Subjective  Pt reporting 2 falls in the past week - feels like his R leg gives way and he "just ends up on the floor". Denies dizziness prior to falls.    Pertinent History  B TKA    Diagnostic tests  Lumbar x-ray: Per MD note, X-rays of his lumbar spine show a slight list to the left. On the lateral view he does have some mild degenerative disc changes at L4-5 and L5-S1 with some foraminal stenosis and grade 1 slips of L3-4, 4-5.    Patient Stated Goals  "become more  mobile"    Currently in Pain?  Yes    Pain Score  5     Pain Location  Buttocks    Pain Orientation  Right    Pain Descriptors / Indicators  Constant;Aching    Pain Onset  More than a month ago         [facility] 07[facility] 0852      Ambulation/Gait   Ambulation/Gait Assistance  4: Min guard;5: Supervision    Ambulation Distance (Feet)  90 Feet   x2 - 1 rep each with SPC & RW   Assistive device  Straight cane;Rolling walker    Gait Pattern  Antalgic;Decreased weight shift to right;Decreased step length - right;Decreased step length - left;Decreased stride length;Decreased hip/knee flexion - right;Decreased hip/knee flexion - left;Poor foot clearance - right;Poor foot clearance - left    Ambulation Surface  Level;Indoor    Gait velocity  1.26 ft/sec with SPC    Gait Comments  Balance testing indicating high risk for falls and need for RW rather than cane to decrease fall risk - pt appears open to idea of using RW until pain better controlled and balance improved, therefore provided training in  safe use of RW for transfers and gait with cues for upright posture and RW proximity.      Standardized Balance Assessment   Standardized Balance Assessment  Berg Balance Test;Timed Up and Go Test;Five Times Sit to Stand;10 meter walk test    10 Meter Walk  26.06 sec with SPC      Berg Balance Test   Sit to Stand  Able to stand  independently using hands    Standing Unsupported  Able to stand 2 minutes with supervision   decreased weight shift to R   Sitting with Back Unsupported but Feet Supported on Floor or Stool  Able to sit safely and securely 2 minutes    Stand to Sit  Sits safely with minimal use of hands    Transfers  Able to transfer safely, minor use of hands    Standing Unsupported with Eyes Closed  Able to stand 10 seconds with supervision    Standing Unsupported with Feet Together  Able to place feet together independently and stand for 1 minute with supervision    decreased weight shift to R   From Standing, Reach Forward with Outstretched Arm  Can reach forward >5 cm safely (2")    From Standing Position, Pick up Object from Floor  Able to pick up shoe, needs supervision    From Standing Position, Turn to Look Behind Over each Shoulder  Needs supervision when turning   decreased weight shift/turn to R   Turn 360 Degrees  Able to turn 360 degrees safely but slowly    Standing Unsupported, Alternately Place Feet on Step/Stool  Able to complete 4 steps without aid or supervision    Standing Unsupported, One Foot in Front  Able to take small step independently and hold 30 seconds    Standing on One Leg  Tries to lift leg/unable to hold 3 seconds but remains standing independently    Total Score  37    Berg comment:  37-45 significant fall risk (>80%)      Timed Up and Go Test   Normal TUG (seconds)  32    TUG Comments  Normal: >13.5 sec indicates high fall risk                   [facility] Adult PT Treatment/Exercise - 07/22/19 0852      Lumbar Exercises: Aerobic   Nustep  warm-up deferred due to elevated HR & BP      Modalities   Modalities  Electrical Stimulation;Moist Heat      Moist Heat Therapy   Number Minutes Moist Heat  15 Minutes    Moist Heat Location  Hip   R hip & buttock (seated)     Electrical Stimulation   Electrical Stimulation Location  R hip & buttock    Electrical Stimulation Action  IFC    Electrical Stimulation Parameters  80-150 Hz, intensity to pt tolerance x 15'    Electrical Stimulation Goals  Pain               PT Short Term Goals - 07/22/19 0959      PT SHORT TERM GOAL #1   Title  Patient will be independent with initial HEP    Status  Partially Met   limited ability to verbalize exercises however much improved demonstration with review   Target Date  07/18/19      PT SHORT TERM GOAL #2   Title  Formally assess balance if continued instability noted  Baseline  patient demonstrating LOB  upon walking through clinic to treatment room    Status  Achieved   07/22/19       PT Long Term Goals - 07/22/19 0959      PT LONG TERM GOAL #1   Title  Patient to be independent with advanced HEP.    Status  On-going    Target Date  08/22/19      PT LONG TERM GOAL #2   Title  Patient will improve lumbar to ROM to >/=50% w/o limitation due to pain    Status  On-going    Target Date  08/22/19      PT LONG TERM GOAL #3   Title  Patient to demonstrate >/= 4 to 4+/5 strength in B LEs for improved stablity.    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 due to low back/buttock pain    Status  On-going    Target Date  08/22/19      PT LONG TERM GOAL #6   Title  Patient will demonstrate improved gait speed to >/= 2.0 ft/sec with LRAD and normal gait pattern to reduce risk for falls    Status  New    Target Date  08/22/19      PT LONG TERM GOAL #7   Title  Patient will decrease TUG time to </= 16 sec to decrease fall risk    Status  New    Target Date  08/22/19      PT LONG TERM GOAL #8   Title  Patient will improve Berg to >/= 46/56 to reduce fall risk and allow patient to safely return to using [facility] for ambulation    Status  New    Target Date  08/22/19            Plan - 07/22/19 0951    Clinical Impression Statement  Sayf reporting 2 falls in the past week where his R LE gave way due to pain and/or weakness. As such, proceeded with fall assessment and balance testing with Berg, TUG and gait speed all indicating significant to high risk for recurrent falls. Recommended patient start using RW (he states he already has one available at home) for now until pain  subsided and balance improves with patient stating willingness to try. Provided training in safe transfers and gait using RW, emphasizing upright posture and RW proximity for improved stability and ability to offload R LE as needed for pain. Given ongoing increased R hip/buttock and LE pain, treatment concluded with IFC estim and moist heat to R hip/buttock with patient noting good benefit from this and inquiring about potential home unit. Hence with patient verbal consent, will inquire into insurance coverage for home Flex-IT TENS unit from [facility].    Personal Factors and Comorbidities  Comorbidity 3+;Fitness;Time since onset of injury/illness/exacerbation;Age    Comorbidities  OA s/p B TKA, prostate cancer, HTN    Examination-Activity Limitations  Bend;Continence;Lift;Locomotion Level;Sit;Stand;Transfers    Examination-Participation Restrictions  Yard Work;Community Activity    Rehab Potential  Good    PT  Frequency  2x / week    PT Duration  8 weeks    PT Treatment/Interventions  ADLs/Self Care Home Management;Cryotherapy;Electrical Stimulation;Iontophoresis 73m/ml Dexamethasone;Moist Heat;Traction;Ultrasound;Gait training;Functional mobility training;Therapeutic activities;Therapeutic exercise;Balance training;Neuromuscular re-education;Patient/family education;Manual techniques;Passive range of motion;Dry needling;Taping;Spinal Manipulations;Joint Manipulations    PT Next Visit Plan  Monitor vitals; review gait safety with RW; further review initial HEP prn; lumbopelvic stretching and strengthening; manual STM to R glutes/piriformis as indicated including DN prn; modalities prn; balance assessment    PT Home Exercise Plan  06/27/19 - SKTC & KTOS stretches, abdominal bracing/pelvic tilt, bridging, seated modified piriformis stretch;  07/12/19 - sit<>stand with UE pushoff, sidelying clam shell (no resistance)    Consulted and Agree with Plan of Care  Patient       Patient will benefit from skilled  therapeutic intervention in order to improve the following deficits and impairments:  Abnormal gait, Decreased activity tolerance, Decreased balance, Decreased coordination, Decreased endurance, Decreased mobility, Decreased range of motion, Decreased safety awareness, Decreased strength, Increased muscle spasms, Impaired perceived functional ability, Impaired flexibility, Improper body mechanics, Postural dysfunction, Pain  Visit Diagnosis: Acute right-sided low back pain with right-sided sciatica  Other muscle spasm  Muscle weakness (generalized)  Difficulty in walking, not elsewhere classified  Unsteadiness on feet     Problem List Patient Active Problem List   Diagnosis Date Noted  . Syncope 08/30/2018  . Essential hypertension 08/30/2018  . [place] 08/30[place]  . Hyponatremia 08/30/2018  . Volume depletion 08/30/2018  . OA (osteoarthritis) of knee 09/10/2015    JPercival Spanish12/06/2019, 11:36 AM  [facility] [place]airlawnHM[place] [address]Phone: [phone]  Fax:  [phone] Name: LKYPTON ELTRINGHAMMRN: [id number] of Birth: 1Dec 30, 1942

## 2019-07-26 ENCOUNTER — Other Ambulatory Visit: Payer: Self-pay

## 2019-07-26 ENCOUNTER — Ambulatory Visit: Payer: Medicare Other

## 2019-07-26 VITALS — BP 142/80 | HR 105

## 2019-07-26 DIAGNOSIS — M5441 Lumbago with sciatica, right side: Secondary | ICD-10-CM

## 2019-07-26 DIAGNOSIS — R262 Difficulty in walking, not elsewhere classified: Secondary | ICD-10-CM

## 2019-07-26 DIAGNOSIS — M62838 Other muscle spasm: Secondary | ICD-10-CM

## 2019-07-26 DIAGNOSIS — R2681 Unsteadiness on feet: Secondary | ICD-10-CM

## 2019-07-26 DIAGNOSIS — M6281 Muscle weakness (generalized): Secondary | ICD-10-CM

## 2019-07-26 NOTE — Therapy (Signed)
[facility] [address]  [place], [place], [address] Phone: [phone]   Fax:  [phone]  Physical Therapy Treatment  Patient Details  Name: Seth [name] MRN: [id number] Date of Birth: July 27, 1941 Referring Provider (PT): Gerrit Halls, PA-C   Encounter Date: 07/26/2019  PT End of Session - 07/26/19 0855    Visit Number  8    Number of Visits  16    Date for PT Re-Evaluation  08/22/19    Authorization Type  [employer] Medicare [place]    PT Start Time  0848    PT Stop Time  0945    PT Time Calculation (min)  57 min    Activity Tolerance  Patient tolerated treatment well    Behavior During Therapy  [facility] for tasks assessed/performed       Past Medical History:  Diagnosis Date  . Arthritis   . Cancer (HCC)    hx of prostate cancer   . History of kidney stones   . Hypertension   . Pneumonia    06/2015     Past Surgical History:  Procedure Laterality Date  . PROSTATECTOMY    . 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   Procedure: TOTAL KNEE ARTHROPLASTY;  Surgeon: Gaynelle Arabian, MD;  Location: [facility];  Service: Orthopedics;  Laterality: Left;  34mn    Vitals:   07/26/19 0853 07/26/19 0908  BP: 132/78 (!) 142/80  Pulse: (!) 105 (!) 105  SpO2: 97% 95%    Subjective Assessment - 07/26/19 0853    Subjective  Pt. reporting he has been performing HEP daily.  Notes R buttocks pain is unchanged today.    Pertinent History  B TKA    Diagnostic tests  Lumbar x-ray: Per MD note, X-rays of his lumbar spine show a slight list to the left. On the lateral view he does have some mild degenerative disc changes at L4-5 and L5-S1 with some foraminal stenosis and grade 1 slips of L3-4, 4-5.    Currently in Pain?  Yes    Pain Score  7     Pain  Location  Buttocks    Pain Orientation  Right    Pain Descriptors / Indicators  Constant;Aching    Pain Type  Acute pain    Pain Onset  More than a month ago    Pain Frequency  Intermittent    Multiple Pain Sites  [name]                       [facility] Adult PT Treatment/Exercise - 07/26/19 0001      Transfers   Transfers  Sit to Stand;Stand to Sit    Sit to Stand  5: Supervision    Sit to Stand Details  Tactile cues for sequencing;Verbal cues for technique    Sit to Stand Details (indicate cue type and reason)  cues for proper hand positioning  and proper sequencing     Stand to Sit  5: Supervision    Stand to Sit Details (indicate cue type and reason)  Tactile cues for sequencing;Tactile cues for placement    Stand to Sit Details  cues required for RW spacing from chair and for hand placement to lower pt. down under control to chair  Ambulation/Gait   Ambulation/Gait  Yes    Ambulation/Gait Assistance  5: Supervision    Ambulation/Gait Assistance Details  Cues required for spacing of BOS from RW; cues for upright posture; cues for increased LE clearance     Ambulation Distance (Feet)  90 Feet    Assistive device  Rolling walker    Gait Pattern  Antalgic;Decreased weight shift to right;Decreased step length - right;Decreased step length - left;Decreased stride length;Decreased hip/knee flexion - right;Decreased hip/knee flexion - left;Poor foot clearance - right;Poor foot clearance - left    Ambulation Surface  Level;Indoor      Lumbar Exercises: Stretches   Piriformis Stretch  Right;30 seconds;1 rep   improved technqieu with less cueing    Piriformis Stretch Limitations  seated with R LE resting on table       Lumbar Exercises: Aerobic   Nustep  NuStep: lvl 1, 6 min       Lumbar Exercises: Seated   Sit to Stand  5 reps   2 sets    Sit to Stand Limitations  from chair with armrests       Lumbar Exercises: Supine   Bridge with clamshell  10 reps;3 seconds     Bridge with [employer] Limitations  + isometric hip abd/ER into red TB at knees       Moist Heat Therapy   Number Minutes Moist Heat  15 Minutes    Moist Heat Location  Hip   R buttocks      Electrical Stimulation   Electrical Stimulation Location  R hip & buttock    Electrical Stimulation Action  IFC    Electrical Stimulation Parameters  80-_0 , intensity to pt. tolerance, 15'    Electrical Stimulation Goals  Pain      Manual Therapy   Manual Therapy  Soft tissue mobilization;Myofascial release    Manual therapy comments  sitting and standing     Soft tissue mobilization  STM/DTM to L glute/piriformis - ttp     Myofascial Release  Manual TPR to R glute in standing leaning on RW               PT Short Term Goals - 07/22/19 0959      PT SHORT TERM GOAL #1   Title  Patient will be independent with initial HEP    Status  Partially Met   limited ability to verbalize exercises however much improved demonstration with review   Target Date  07/18/19      PT SHORT TERM GOAL #2   Title  Formally assess balance if continued instability noted    Baseline  patient demonstrating LOB upon walking through clinic to treatment room    Status  Achieved   07/22/19       PT Long Term Goals - 07/26/19 1443      PT LONG TERM GOAL #1   Title  Patient to be independent with advanced HEP.    Status  On-going      PT LONG TERM GOAL #2   Title  Patient will improve lumbar to ROM to >/=50% w/o limitation due to pain    Status  On-going      PT LONG TERM GOAL #3   Title  Patient to demonstrate >/= 4 to 4+/5 strength in B LEs for improved stablity.    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 due to low back/buttock pain     Status  On-going      PT LONG TERM GOAL #6   Title  Patient will demonstrate improved gait speed to >/= 2.0 ft/sec with LRAD and normal gait pattern to reduce risk for falls    Status  On-going      PT LONG TERM GOAL #7   Title  Patient will decrease TUG time to </= 16 sec to decrease fall risk    Status  On-going      PT LONG TERM GOAL #8   Title  Patient will improve Berg to >/= 46/56 to reduce fall risk and allow patient to safely return to using [facility] for ambulation    Status  On-going            Plan - 07/26/19 0904    Clinical Impression Statement  Mr. Gallon seen to start visit ambulating into clinic with [facility] with significant instability noted.  Pt. had previously verbalized that he had RW at home and had planned to start using this after last session for safety however reports, "I forgot to bring it".  Pt. encouraged to ambulate in his home and in community with RW for safety with pt. verbalized understanding.  Gait training with clinic RW in session today with cueing required for pt. maintain BOS close to RW and for upright posture.  Limited carryover after gait training today with pt. reverting back to "hunched forward RW positioning".  Did require heavy cueing at times today for RW spacing from chair during transfers.  Pt. with tendency for excessive posterior weight shift and "hard landing" with stand > sit transfer.  Pt. educated on proper sit<>stand technique with hand placement and RW spacing for improved safety with good carryover.  Some improvement with MT however pt. still with complaint of buttocks pain which has been persistent over last few visits thus ended session with E-stim/moist heat to hip.  Ended visit with pt. reporting good pain relief.     Comorbidities  OA s/p B TKA, prostate cancer, HTN    Rehab Potential  Good    PT Treatment/Interventions  ADLs/Self Care Home Management;Cryotherapy;Electrical Stimulation;Iontophoresis 72m/ml Dexamethasone;Moist  Heat;Traction;Ultrasound;Gait training;Functional mobility training;Therapeutic activities;Therapeutic exercise;Balance training;Neuromuscular re-education;Patient/family education;Manual techniques;Passive range of motion;Dry needling;Taping;Spinal Manipulations;Joint Manipulations    PT Next Visit Plan  Monitor vitals; review gait safety with RW; further review initial HEP prn; lumbopelvic stretching and strengthening; manual STM to R glutes/piriformis as indicated including DN prn; modalities prn; balance assessment    PT Home Exercise Plan  06/27/19 - SKTC & KTOS stretches, abdominal bracing/pelvic tilt, bridging, seated modified piriformis stretch;  07/12/19 - sit<>stand with UE pushoff, sidelying clam shell ([name] resistance)    Consulted and Agree with Plan of Care  Patient       Patient will benefit from skilled therapeutic intervention in order to improve the following deficits and impairments:  Abnormal gait, Decreased activity tolerance, Decreased balance, Decreased coordination, Decreased endurance, Decreased mobility, Decreased range of motion, Decreased safety awareness, Decreased strength, Increased muscle spasms, Impaired perceived functional ability, Impaired flexibility, Improper body mechanics, Postural dysfunction, Pain  Visit Diagnosis: Acute right-sided low back pain with right-sided sciatica  Other muscle spasm  Muscle weakness (generalized)  Difficulty in walking,  not elsewhere classified  Unsteadiness on feet     Problem List Patient Active Problem List   Diagnosis Date Noted  . Syncope 08/30/2018  . Essential hypertension 08/30/2018  . AKI (acute kidney injury) ([place]) 08/30/2018  . Hyponatremia 08/30/2018  . Volume depletion 08/30/2018  . OA (osteoarthritis) of knee 09/10/2015    Bess Harvest, PTA 07/26/19 6:10 PM   [facility] [address]  [place], [place], [address] Phone: [phone]    Fax:  [phone]  Name: GAD AYMOND MRN: [id number] Date of Birth: [birth year]

## 2019-07-29 ENCOUNTER — Other Ambulatory Visit: Payer: Self-pay

## 2019-07-29 ENCOUNTER — Ambulatory Visit: Payer: Medicare Other

## 2019-07-29 VITALS — BP 160/88 | HR 117

## 2019-07-29 DIAGNOSIS — M5441 Lumbago with sciatica, right side: Secondary | ICD-10-CM

## 2019-07-29 DIAGNOSIS — R262 Difficulty in walking, not elsewhere classified: Secondary | ICD-10-CM

## 2019-07-29 DIAGNOSIS — M62838 Other muscle spasm: Secondary | ICD-10-CM

## 2019-07-29 DIAGNOSIS — M6281 Muscle weakness (generalized): Secondary | ICD-10-CM

## 2019-07-29 DIAGNOSIS — R2681 Unsteadiness on feet: Secondary | ICD-10-CM

## 2019-07-29 NOTE — Therapy (Addendum)
[facility] [address]  [place], [place], [address] Phone: [phone]   Fax:  [phone]  Physical Therapy Treatment / Discharge Summary  Patient Details  Name: Seth [name] MRN: [id number] Date of Birth: 10/09/1940 Referring Provider (PT): Gerrit Halls, PA-C   Encounter Date: 07/29/2019  PT End of Session - 07/29/19 0858    Visit Number  9    Number of Visits  16    Date for PT Re-Evaluation  08/22/19    Authorization Type  [employer] [place]    PT Start Time  0852    PT Stop Time  0942   Ended visit with 10 min moist heat   PT Time Calculation (min)  50 min    Activity Tolerance  Patient tolerated treatment well    Behavior During Therapy  [facility] for tasks assessed/performed       Past Medical History:  Diagnosis Date  . Arthritis   . Cancer (HCC)    hx of prostate cancer   . History of kidney stones   . Hypertension   . Pneumonia    06/2015     Past Surgical History:  Procedure Laterality Date  . PROSTATECTOMY    . 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   Procedure: TOTAL KNEE ARTHROPLASTY;  Surgeon: Gaynelle Arabian, MD;  Location: [facility];  Service: Orthopedics;  Laterality: Left;  19mn    Vitals:   07/29/19 0856  BP: (!) 160/88  Pulse: (!) 117  SpO2: 97%    Subjective Assessment - 07/29/19 0856    Subjective  Pt. noting buttocks pain continues most when he is not moving.    Pertinent History  B TKA    Diagnostic tests  Lumbar x-ray: Per MD note, X-rays of his lumbar spine show a slight list to the left. On the lateral view he does have some mild degenerative disc changes at L4-5 and L5-S1 with some foraminal stenosis and grade 1 slips of L3-4, 4-5.    Patient Stated Goals  "become more mobile"    Currently  in Pain?  Yes    Pain Score  7     Pain Location  Buttocks    Pain Orientation  Right    Pain Descriptors / Indicators  Constant;Aching    Pain Type  Acute pain    Pain Onset  More than a month ago    Pain Frequency  Intermittent    Multiple Pain Sites  No                       [facility] Adult PT Treatment/Exercise - 07/29/19 0001      Transfers   Transfers  Sit to Stand;Stand to Sit    Sit to Stand  5: Supervision    Sit to Stand Details  Verbal cues for technique    Sit to Stand Details (indicate cue type and reason)  cues to not pull up on RW however push from chair     Stand to Sit  5: Supervision    Stand to Sit Details (indicate cue type and reason)  Verbal cues for sequencing    Stand to Sit Details  cues required for hand placement as pt. sitting down to chair to lower himself down  under control       Ambulation/Gait   Ambulation/Gait  Yes    Ambulation/Gait Assistance  5: Supervision    Ambulation/Gait Assistance Details  cues for closer RW spacing from BOS for upright posture     Ambulation Distance (Feet)  90 Feet    Assistive device  Rolling walker    Gait Pattern  Antalgic;Decreased weight shift to right;Decreased step length - right;Decreased step length - left;Decreased stride length;Decreased hip/knee flexion - right;Decreased hip/knee flexion - left;Poor foot clearance - right;Poor foot clearance - left    Ambulation Surface  Level;Indoor      Self-Care   Self-Care  Other Self-Care Comments    Other Self-Care Comments   Instruction on proper set, use, electrode placement, and to get help from wife with electrode placement if necessary for home TENS/IT unit pt. has covered and was issued today - increased time required for instruction and will likely require further review in upcoming sessions      Lumbar Exercises: Aerobic   Nustep  warm-up deferred due to elevated HR      Moist Heat Therapy   Number Minutes Moist Heat  10 Minutes    Moist Heat  Location  --   R buttocks - continues to note sig relief with moist heat               PT Short Term Goals - 07/22/19 0959      PT SHORT TERM GOAL #1   Title  Patient will be independent with initial HEP    Status  Partially Met   limited ability to verbalize exercises however much improved demonstration with review   Target Date  07/18/19      PT SHORT TERM GOAL #2   Title  Formally assess balance if continued instability noted    Baseline  patient demonstrating LOB upon walking through clinic to treatment room    Status  Achieved   07/22/19       PT Long Term Goals - 07/26/19 1443      PT LONG TERM GOAL #1   Title  Patient to be independent with advanced HEP.    Status  On-going      PT LONG TERM GOAL #2   Title  Patient will improve lumbar to ROM to >/=50% w/o limitation due to pain    Status  On-going      PT LONG TERM GOAL #3   Title  Patient to demonstrate >/= 4 to 4+/5 strength in B LEs for improved stablity.    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 due to low back/buttock pain    Status  On-going      PT LONG TERM GOAL #6   Title  Patient will demonstrate improved gait speed to >/= 2.0 ft/sec with LRAD and normal gait pattern to reduce risk for falls    Status  On-going      PT LONG TERM GOAL #7   Title  Patient will decrease TUG time to </= 16 sec to decrease fall risk    Status  On-going      PT LONG TERM GOAL #  8   Title  Patient will improve Berg to >/= 46/56 to reduce fall risk and allow patient to safely return to using [facility] for ambulation    Status  On-going            Plan - 07/29/19 1610    Clinical Impression Statement  Pt. seen ambulating into clinic using his RW however  with excessive forward lean onto RW requiring cueing for correction and limited carryover for upright posture.  Pt. vitals initially were HR 117, BP 160/88 in sitting resting position and pt. made aware of elevated HR thus deferred warmup today.  Pt. still with complaint of 7/10 average R buttocks pain and was just issued home TENS/IT unit since approval from insurance thus spent majority of today's session in TENS unit instruction for pain relief at home.  Avoided exerting exercise today as pt. vitals not therapeutic for session.  Ended session today with moist heat to buttocks per pt. request as he has gotten good pain relief from this in past sessions.  Will likely require further skilled instruction in proper use of home TENS unit.    Comorbidities  OA s/p B TKA, prostate cancer, HTN    Rehab Potential  Good    PT Treatment/Interventions  ADLs/Self Care Home Management;Cryotherapy;Electrical Stimulation;Iontophoresis 72m/ml Dexamethasone;Moist Heat;Traction;Ultrasound;Gait training;Functional mobility training;Therapeutic activities;Therapeutic exercise;Balance training;Neuromuscular re-education;Patient/family education;Manual techniques;Passive range of motion;Dry needling;Taping;Spinal Manipulations;Joint Manipulations    PT Next Visit Plan  Monitor vitals; review gait safety with RW; further review initial HEP prn; lumbopelvic stretching and strengthening; manual STM to R glutes/piriformis as indicated including DN prn; modalities prn; balance assessment    PT Home Exercise Plan  06/27/19 - SKTC & KTOS stretches, abdominal bracing/pelvic tilt, bridging, seated modified piriformis stretch;  07/12/19 - sit<>stand with UE pushoff, sidelying clam shell (no resistance)    Consulted and Agree with Plan of Care  Patient       Patient will benefit from skilled therapeutic intervention in order to improve the following deficits and impairments:  Abnormal gait, Decreased activity tolerance, Decreased balance,  Decreased coordination, Decreased endurance, Decreased mobility, Decreased range of motion, Decreased safety awareness, Decreased strength, Increased muscle spasms, Impaired perceived functional ability, Impaired flexibility, Improper body mechanics, Postural dysfunction, Pain  Visit Diagnosis: Acute right-sided low back pain with right-sided sciatica  Other muscle spasm  Muscle weakness (generalized)  Difficulty in walking, not elsewhere classified  Unsteadiness on feet     Problem List Patient Active Problem List   Diagnosis Date Noted  . Syncope 08/30/2018  . Essential hypertension 08/30/2018  . AKI (acute [place] injury) ([place] 08/30/2018  . Hyponatremia 08/30/2018  . Volume depletion 08/30/2018  . OA (osteoarthritis) of knee 09/10/2015    MBess Harvest PTA 07/29/19 1:07 PM    [facility] [address] [place] [address]Phone: [phone]  Fax:  [phone] Name: Seth GIVLERMRN: [id number] of Birth: [birth year]  PHYSICAL THERAPY DISCHARGE SUMMARY  Visits from Start of Care: 9  Current functional level related to goals / functional outcomes:   Refer to above clinical impression for status as of last visit on 07/29/2019. As of 08/15/2019, patient called to cancel all future appointment and has not returned to PT in >30 days, therefore will proceed with discharge from PT for this episode.   Remaining deficits:   As above. Unable to formally assess status at discharge due to failure to return to PT.  Education / Equipment:   HEP  Plan: Patient agrees to discharge.  Patient goals were partially met. Patient is being discharged due to not returning since the last visit.  ?????     Percival Spanish, PT, MPT 08/31/19, 10:31 AM  [facility] [address]  [place], [place], [address] Phone: [phone]   Fax:   [phone]

## 2019-08-02 ENCOUNTER — Ambulatory Visit: Payer: Medicare Other | Admitting: Physical Therapy

## 2019-08-03 ENCOUNTER — Ambulatory Visit: Payer: Medicare Other | Admitting: Physical Therapy

## 2019-08-08 ENCOUNTER — Ambulatory Visit: Payer: Medicare Other

## 2019-08-11 ENCOUNTER — Ambulatory Visit: Payer: Medicare Other | Admitting: Physical Therapy

## 2019-08-15 ENCOUNTER — Ambulatory Visit: Payer: Medicare Other

## 2019-08-18 ENCOUNTER — Encounter: Payer: Medicare Other | Admitting: Physical Therapy

## 2020-04-12 ENCOUNTER — Other Ambulatory Visit: Payer: Self-pay

## 2020-04-12 ENCOUNTER — Encounter (HOSPITAL_BASED_OUTPATIENT_CLINIC_OR_DEPARTMENT_OTHER): Payer: Self-pay

## 2020-04-12 ENCOUNTER — Inpatient Hospital Stay (HOSPITAL_BASED_OUTPATIENT_CLINIC_OR_DEPARTMENT_OTHER)
Admission: EM | Admit: 2020-04-12 | Discharge: 2020-04-14 | DRG: 177 | Disposition: A | Discharge status: Home Health | Payer: Medicare Other | Attending: Internal Medicine | Admitting: Internal Medicine

## 2020-04-12 ENCOUNTER — Emergency Department (HOSPITAL_BASED_OUTPATIENT_CLINIC_OR_DEPARTMENT_OTHER): Payer: Medicare Other

## 2020-04-12 DIAGNOSIS — Z79899 Other long term (current) drug therapy: Secondary | ICD-10-CM

## 2020-04-12 DIAGNOSIS — J9601 Acute respiratory failure with hypoxia: Secondary | ICD-10-CM

## 2020-04-12 DIAGNOSIS — R7989 Other specified abnormal findings of blood chemistry: Secondary | ICD-10-CM

## 2020-04-12 DIAGNOSIS — E872 Acidosis: Secondary | ICD-10-CM | POA: Diagnosis present

## 2020-04-12 DIAGNOSIS — N179 Acute kidney failure, unspecified: Secondary | ICD-10-CM | POA: Diagnosis present

## 2020-04-12 DIAGNOSIS — Z96653 Presence of artificial knee joint, bilateral: Secondary | ICD-10-CM | POA: Diagnosis present

## 2020-04-12 DIAGNOSIS — A419 Sepsis, unspecified organism: Secondary | ICD-10-CM

## 2020-04-12 DIAGNOSIS — J1282 Pneumonia due to coronavirus disease 2019: Secondary | ICD-10-CM | POA: Diagnosis present

## 2020-04-12 DIAGNOSIS — M1990 Unspecified osteoarthritis, unspecified site: Secondary | ICD-10-CM | POA: Diagnosis present

## 2020-04-12 DIAGNOSIS — Z87891 Personal history of nicotine dependence: Secondary | ICD-10-CM

## 2020-04-12 DIAGNOSIS — U071 COVID-19: Secondary | ICD-10-CM | POA: Diagnosis not present

## 2020-04-12 DIAGNOSIS — E785 Hyperlipidemia, unspecified: Secondary | ICD-10-CM | POA: Diagnosis present

## 2020-04-12 DIAGNOSIS — R778 Other specified abnormalities of plasma proteins: Secondary | ICD-10-CM

## 2020-04-12 DIAGNOSIS — I10 Essential (primary) hypertension: Secondary | ICD-10-CM | POA: Diagnosis present

## 2020-04-12 DIAGNOSIS — Z7901 Long term (current) use of anticoagulants: Secondary | ICD-10-CM

## 2020-04-12 DIAGNOSIS — Z8546 Personal history of malignant neoplasm of prostate: Secondary | ICD-10-CM

## 2020-04-12 LAB — CBC
HCT: 43.8 % (ref 39.0–52.0)
Hemoglobin: 13.9 g/dL (ref 13.0–17.0)
MCH: 25.6 pg — ABNORMAL LOW (ref 26.0–34.0)
MCHC: 31.7 g/dL (ref 30.0–36.0)
MCV: 80.5 fL (ref 80.0–100.0)
Platelets: 226 10*3/uL (ref 150–400)
RBC: 5.44 MIL/uL (ref 4.22–5.81)
RDW: 16.1 % — ABNORMAL HIGH (ref 11.5–15.5)
WBC: 4 10*3/uL (ref 4.0–10.5)
nRBC: 0 % (ref 0.0–0.2)

## 2020-04-12 LAB — TROPONIN I (HIGH SENSITIVITY)
Troponin I (High Sensitivity): 136 ng/L (ref <18)
Troponin I (High Sensitivity): 123 ng/L (ref <18)

## 2020-04-12 LAB — URINALYSIS, ROUTINE W REFLEX MICROSCOPIC
Glucose, UA: NEGATIVE mg/dL
Ketones, ur: NEGATIVE mg/dL
Leukocytes,Ua: NEGATIVE
Nitrite: NEGATIVE
Protein, ur: 100 mg/dL — AB
Specific Gravity, Urine: >1.03 — ABNORMAL HIGH (ref 1.005–1.030)
pH: 5.5 (ref 5.0–8.0)

## 2020-04-12 LAB — BASIC METABOLIC PANEL
Anion gap: 13 (ref 5–15)
BUN: 21 mg/dL (ref 8–23)
CO2: 20 mmol/L — ABNORMAL LOW (ref 22–32)
Calcium: 8.4 mg/dL — ABNORMAL LOW (ref 8.9–10.3)
Chloride: 102 mmol/L (ref 98–111)
Creatinine, Ser: 1.47 mg/dL — ABNORMAL HIGH (ref 0.61–1.24)
GFR calc Af Amer: 53 mL/min — ABNORMAL LOW (ref >60)
GFR calc non Af Amer: 45 mL/min — ABNORMAL LOW (ref >60)
Glucose, Bld: 118 mg/dL — ABNORMAL HIGH (ref 70–99)
Potassium: 3.9 mmol/L (ref 3.5–5.1)
Sodium: 135 mmol/L (ref 135–145)

## 2020-04-12 LAB — FIBRINOGEN: Fibrinogen: 536 mg/dL — ABNORMAL HIGH (ref 210–475)

## 2020-04-12 LAB — TRIGLYCERIDES: Triglycerides: 109 mg/dL (ref <150)

## 2020-04-12 LAB — C-REACTIVE PROTEIN: CRP: 5.6 mg/dL — ABNORMAL HIGH (ref <1.0)

## 2020-04-12 LAB — D-DIMER, QUANTITATIVE: D-Dimer, Quant: 2.01 ug/mL-FEU — ABNORMAL HIGH (ref 0.00–0.50)

## 2020-04-12 LAB — LACTIC ACID, PLASMA: Lactic Acid, Venous: 1.4 mmol/L (ref 0.5–1.9)

## 2020-04-12 LAB — URINALYSIS, MICROSCOPIC (REFLEX)

## 2020-04-12 LAB — LACTATE DEHYDROGENASE: LDH: 429 U/L — ABNORMAL HIGH (ref 98–192)

## 2020-04-12 LAB — PROCALCITONIN: Procalcitonin: 0.2 ng/mL

## 2020-04-12 LAB — SARS CORONAVIRUS 2 BY RT PCR (HOSPITAL ORDER, PERFORMED IN ~~LOC~~ HOSPITAL LAB): SARS Coronavirus 2: POSITIVE — AB

## 2020-04-12 LAB — FERRITIN: Ferritin: 98 ng/mL (ref 24–336)

## 2020-04-12 MED ORDER — SODIUM CHLORIDE 0.9 % IV SOLN
100.0000 mg | Freq: Every day | INTRAVENOUS | Status: DC
  Administered 2020-04-13 – 2020-04-14 (×2): 100 mg via INTRAVENOUS
  Filled 2020-04-12 (×2): qty 20

## 2020-04-12 MED ORDER — SODIUM CHLORIDE 0.9 % IV SOLN
100.0000 mg | INTRAVENOUS | Status: AC
  Administered 2020-04-12 (×2): 100 mg via INTRAVENOUS
  Filled 2020-04-12 (×2): qty 20

## 2020-04-12 MED ORDER — ACETAMINOPHEN 325 MG PO TABS
650.0000 mg | ORAL_TABLET | Freq: Once | ORAL | Status: AC
  Administered 2020-04-12: 650 mg via ORAL
  Filled 2020-04-12: qty 2

## 2020-04-12 MED ORDER — OXYCODONE-ACETAMINOPHEN 5-325 MG PO TABS
1.0000 | ORAL_TABLET | Freq: Once | ORAL | Status: AC
  Administered 2020-04-13: 1 via ORAL
  Filled 2020-04-12: qty 1

## 2020-04-12 MED ORDER — ASPIRIN 81 MG PO CHEW
324.0000 mg | CHEWABLE_TABLET | Freq: Once | ORAL | Status: AC
  Administered 2020-04-12: 324 mg via ORAL
  Filled 2020-04-12: qty 4

## 2020-04-12 MED ORDER — SODIUM CHLORIDE 0.9 % IV BOLUS
1000.0000 mL | Freq: Once | INTRAVENOUS | Status: AC
  Administered 2020-04-12: 1000 mL via INTRAVENOUS

## 2020-04-12 NOTE — ED Triage Notes (Signed)
Pt arrives to triage reporting concern for UTI, denies any urinary symptoms reports he has been dizzy for about a week. Also reports Covid exposure but denies any symptoms of Covid, states "I just want to get checked out".

## 2020-04-12 NOTE — ED Provider Notes (Signed)
[facility] EMERGENCY DEPARTMENT Provider Note   CSN: [id number] Arrival date & time: 04/12/20  1052     History Chief Complaint  Patient presents with  . Dizziness    + covid    Seth [name] is a 79 y.o. male.  HPI 79 year old male presents with generalized weakness.  This has been ongoing for about a week.  He states he was also exposed to Covid and want to get evaluated.  He has had both his vaccinations.  He denies headache, chest pain, shortness of breath, cough, vomiting, diarrhea.  Just generally feels weak.   Past Medical History:  Diagnosis Date  . Arthritis   . Cancer (HCC)    hx of prostate cancer   . History of kidney stones   . Hypertension   . Pneumonia    06/2015     Patient Active Problem List   Diagnosis Date Noted  . COVID-19 04/12/2020  . Syncope 08/30/2018  . Essential hypertension 08/30/2018  . AKI (acute kidney injury) ([place]) 08/30/2018  . Hyponatremia 08/30/2018  . Volume depletion 08/30/2018  . OA (osteoarthritis) of knee 09/10/2015    Past Surgical History:  Procedure Laterality Date  . PROSTATECTOMY    . 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   Procedure: TOTAL KNEE ARTHROPLASTY;  Surgeon: Gaynelle Arabian, MD;  Location: [facility];  Service: Orthopedics;  Laterality: Left;  56min       No family history on file.  Social History   Tobacco Use  . Smoking status: Former Smoker    Years: 15.00    Types: Cigarettes    Quit date: 08/12/2008    Years since quitting: 11.6  . Smokeless tobacco: Never Used  Vaping Use  . Vaping Use: Never used  Substance Use Topics  . Alcohol use: Not Currently    Comment: occasionally   . Drug use: No    Home Medications Prior to Admission medications   Medication Sig Start Date End Date Taking? Authorizing  Provider  albuterol (PROVENTIL HFA;VENTOLIN HFA) 108 (90 Base) MCG/ACT inhaler Inhale 1-2 puffs into the lungs every 6 (six) hours as needed for wheezing or shortness of breath. Patient not taking: Reported on 08/30/2018 11/08/17   Tegeler, Gwenyth Allegra, MD  aspirin 500 MG tablet Take 500 mg by mouth every 6 (six) hours as needed for pain.    [provider]  atorvastatin (LIPITOR) 40 MG tablet Take 40 mg by mouth every morning.  08/17/15   [provider]  augmented betamethasone dipropionate (DIPROLENE) 0.05 % ointment Apply topically 2 (two) times daily. Patient not taking: Reported on 08/30/2018 12/10/15   Domenic Moras, PA-C  Brinzolamide-Brimonidine [facility]) 1-0.2 % SUSP Place 1 drop into both eyes 2 (two) times daily.     [provider]  fluocinonide ointment (LIDEX) 8.11 % Apply 1 application topically 2 (two) times daily. 12/10/15   Domenic Moras, PA-C  methocarbamol (ROBAXIN) 500 MG tablet Take 1 tablet (500 mg total) by mouth every 6 (six) hours as needed for muscle spasms. 08/18/18   Edmisten, Ok Anis, PA  rivaroxaban (XARELTO) 10 MG TABS tablet Take 1 tablet (10 mg total) by mouth daily with breakfast for 19 days. Then take one 81 mg aspirin once a day for three weeks. Then discontinue aspirin. 08/18/18 09/06/18  Edmisten, Ok Anis, PA  traMADol (ULTRAM) 50 MG tablet Take 1-2 tablets (50-100 mg total) by mouth every 6 (six) hours as needed for moderate pain. 08/18/18   Edmisten, Kristie L, PA  TRAVATAN Z 0.004 % SOLN ophthalmic solution Place 1 drop into both eyes at bedtime. 07/16/15   [provider]    Allergies    Patient has no known allergies.  Review of Systems   Review of Systems  Constitutional: Negative for fever.  Respiratory: Negative for cough and shortness of breath.   Cardiovascular: Negative for chest pain.  Gastrointestinal: Negative for abdominal pain and vomiting.  Neurological: Positive for weakness. Negative for headaches.  All other  systems reviewed and are negative.   Physical Exam Updated Vital Signs BP (!) 151/99   Pulse 81   Temp 99.3 F (37.4 C) (Oral)   Resp 20   Ht 5\' 11"  (1.803 m)   Wt 69.4 kg   SpO2 95%   BMI 21.34 kg/m   Physical Exam Vitals and nursing note reviewed.  Constitutional:      General: He is not in acute distress.    Appearance: He is well-developed. He is not ill-appearing or diaphoretic.  HENT:     Head: Normocephalic and atraumatic.     Right Ear: External ear normal.     Left Ear: External ear normal.     Nose: Nose normal.  Eyes:     General:        Right eye: No discharge.        Left eye: No discharge.  Cardiovascular:     Rate and Rhythm: Normal rate and regular rhythm.     Heart sounds: Normal heart sounds.  Pulmonary:     Effort: Pulmonary effort is normal.     Breath sounds: Normal breath sounds.  Abdominal:     Palpations: Abdomen is soft.     Tenderness: There is no abdominal tenderness.  Musculoskeletal:     Cervical back: Neck supple.  Skin:    General: Skin is warm and dry.  Neurological:     Mental Status: He is alert.     Comments: CN 3-12 grossly intact. 5/5 strength in all 4 extremities. Grossly normal sensation.  Psychiatric:        Mood and Affect: Mood is not anxious.     ED Results / Procedures / Treatments   Labs (all labs ordered are listed, but only abnormal results are displayed) Labs Reviewed  SARS CORONAVIRUS 2 BY RT PCR ([facility]) - Abnormal; Notable for the following components:      Result Value   SARS Coronavirus 2 POSITIVE (*)    All other components within normal limits  BASIC METABOLIC PANEL - Abnormal; Notable for the following components:   CO2 20 (*)    Glucose, Bld 118 (*)    Creatinine, Ser 1.47 (*)    Calcium 8.4 (*)    GFR calc non Af Amer 45 (*)    GFR calc Af Amer 53 (*)    All other components within normal limits  CBC - Abnormal; Notable for the following  components:   MCH 25.6 (*)    RDW 16.1 (*)    All other components within normal limits  D-DIMER, QUANTITATIVE (NOT AT [facility] - Abnormal; Notable for the following components:   D-Dimer, Quant 2.01 (*)    All other components within normal limits  TROPONIN I (HIGH SENSITIVITY) - Abnormal; Notable for the following components:  Troponin I (High Sensitivity) 136 (*)    All other components within normal limits  TROPONIN I (HIGH SENSITIVITY) - Abnormal; Notable for the following components:   Troponin I (High Sensitivity) 123 (*)    All other components within normal limits  CULTURE, BLOOD (ROUTINE X 2)  CULTURE, BLOOD (ROUTINE X 2)  LACTIC ACID, PLASMA  PROCALCITONIN  LACTATE DEHYDROGENASE  FERRITIN  TRIGLYCERIDES  FIBRINOGEN  C-REACTIVE PROTEIN  URINALYSIS, ROUTINE W REFLEX MICROSCOPIC    EKG EKG Interpretation  Date/Time:  Thursday April 12 2020 11:31:18 EDT Ventricular Rate:  107 PR Interval:  146 QRS Duration: 68 QT Interval:  334 QTC Calculation: 445 R Axis:   11 Text Interpretation: Sinus tachycardia Minimal voltage criteria for LVH, may be normal variant ( R in aVL ) Borderline ECG similar to Jan 2020 Confirmed by Sherwood Gambler [phone]) on 04/12/2020 11:36:59 AM   Radiology DG Chest Portable 1 View  Result Date: 04/12/2020 CLINICAL DATA:  Dizziness, UTI EXAM: PORTABLE CHEST 1 VIEW COMPARISON:  August 30, 2018 FINDINGS: Trachea midline. Lung volumes are diminished with persistent elevation of the LEFT hemidiaphragm and juxta diaphragmatic atelectasis. Slightly increased airspace opacity over the LEFT hemidiaphragm aside from expected changes of atelectasis. No lobar consolidation. No pleural effusion. On limited assessment skeletal structures without acute process. IMPRESSION: 1. Persistent elevation of the LEFT hemidiaphragm and juxta diaphragmatic atelectasis. 2. Area of vague opacity over lying the LEFT hemidiaphragm may reflect more pronounced atelectatic change,  developing infection in this area is not excluded Electronically Signed   By: Zetta Bills M.D.   On: 04/12/2020 11:49    Procedures .Critical Care Performed by: Sherwood Gambler, MD Authorized by: Sherwood Gambler, MD   Critical care provider statement:    Critical care time (minutes):  30   Critical care time was exclusive of:  Separately billable procedures and treating other patients   Critical care was necessary to treat or prevent imminent or life-threatening deterioration of the following conditions:  Cardiac failure and renal failure   Critical care was time spent personally by me on the following activities:  Discussions with consultants, evaluation of patient's response to treatment, examination of patient, ordering and performing treatments and interventions, ordering and review of laboratory studies, ordering and review of radiographic studies, pulse oximetry, re-evaluation of patient's condition, obtaining history from patient or surrogate and review of old charts   (including critical care time)  Medications Ordered in ED Medications  remdesivir 100 mg in sodium chloride 0.9 % 100 mL IVPB (has no administration in time range)  remdesivir 100 mg in sodium chloride 0.9 % 100 mL IVPB (has no administration in time range)  sodium chloride 0.9 % bolus 1,000 mL (1,000 mLs Intravenous New Bag/Given 04/12/20 1348)  aspirin chewable tablet 324 mg (324 mg Oral Given 04/12/20 1255)    ED Course  I have reviewed the triage vital signs and the nursing notes.  Pertinent labs & imaging results that were available during my care of the patient were reviewed by me and considered in my medical decision making (see chart for details).    MDM Rules/Calculators/A&P                          Patient is not hypoxic, however he does get short of breath and tachypneic when walking in room. No chest pain. Nonspecific elevated troponin that is not rising, but higher than I would expect. Given this  with his weakness, will  admit for echo, monitoring, etc. Given IVF for AKI. D/w Dr. Roosevelt Locks, will give remdesivir but no steroids (not hypoxic at this time).  Hadley Pen was evaluated in Emergency Department on 04/12/2020 for the symptoms described in the history of present illness. He was evaluated in the context of the global COVID-19 pandemic, which necessitated consideration that the patient might be at risk for infection with the SARS-CoV-2 virus that causes COVID-19. Institutional protocols and algorithms that pertain to the evaluation of patients at risk for COVID-19 are in a state of rapid change based on information released by regulatory bodies including the CDC and federal and state organizations. These policies and algorithms were followed during the patient's care in the ED.  Final Clinical Impression(s) / ED Diagnoses Final diagnoses:  COVID-19 virus infection  Elevated troponin  Acute kidney injury [facility])    Rx / DC Orders ED Discharge Orders    None       Sherwood Gambler, MD 04/12/20 1554

## 2020-04-12 NOTE — ED Notes (Signed)
Patient dribbling urine when coughing; pants and brief removed; linens changed; condom cath placed.

## 2020-04-12 NOTE — ED Notes (Signed)
Pt ambulated with SpO2 monitor. SpO2 dropped to 92% and HR at 93 during ambulation. Pt became labored and tachypneic with exertion with RR of 44 and quickly returned to baseline at rest.

## 2020-04-13 ENCOUNTER — Encounter (HOSPITAL_COMMUNITY): Payer: Self-pay | Admitting: Internal Medicine

## 2020-04-13 ENCOUNTER — Inpatient Hospital Stay (HOSPITAL_COMMUNITY): Payer: Medicare Other

## 2020-04-13 DIAGNOSIS — Z79899 Other long term (current) drug therapy: Secondary | ICD-10-CM | POA: Diagnosis not present

## 2020-04-13 DIAGNOSIS — Z87891 Personal history of nicotine dependence: Secondary | ICD-10-CM | POA: Diagnosis not present

## 2020-04-13 DIAGNOSIS — U071 COVID-19: Principal | ICD-10-CM

## 2020-04-13 DIAGNOSIS — J9601 Acute respiratory failure with hypoxia: Secondary | ICD-10-CM

## 2020-04-13 DIAGNOSIS — N179 Acute kidney failure, unspecified: Secondary | ICD-10-CM

## 2020-04-13 DIAGNOSIS — E785 Hyperlipidemia, unspecified: Secondary | ICD-10-CM | POA: Diagnosis present

## 2020-04-13 DIAGNOSIS — J1282 Pneumonia due to coronavirus disease 2019: Secondary | ICD-10-CM | POA: Diagnosis present

## 2020-04-13 DIAGNOSIS — I10 Essential (primary) hypertension: Secondary | ICD-10-CM | POA: Diagnosis present

## 2020-04-13 DIAGNOSIS — Z7901 Long term (current) use of anticoagulants: Secondary | ICD-10-CM | POA: Diagnosis not present

## 2020-04-13 DIAGNOSIS — R778 Other specified abnormalities of plasma proteins: Secondary | ICD-10-CM

## 2020-04-13 DIAGNOSIS — A419 Sepsis, unspecified organism: Secondary | ICD-10-CM

## 2020-04-13 DIAGNOSIS — Z8546 Personal history of malignant neoplasm of prostate: Secondary | ICD-10-CM | POA: Diagnosis not present

## 2020-04-13 DIAGNOSIS — M1990 Unspecified osteoarthritis, unspecified site: Secondary | ICD-10-CM | POA: Diagnosis present

## 2020-04-13 DIAGNOSIS — E872 Acidosis: Secondary | ICD-10-CM | POA: Diagnosis present

## 2020-04-13 DIAGNOSIS — Z96653 Presence of artificial knee joint, bilateral: Secondary | ICD-10-CM | POA: Diagnosis present

## 2020-04-13 LAB — COMPREHENSIVE METABOLIC PANEL
ALT: 56 U/L — ABNORMAL HIGH (ref 0–44)
AST: 105 U/L — ABNORMAL HIGH (ref 15–41)
Albumin: 2.9 g/dL — ABNORMAL LOW (ref 3.5–5.0)
Alkaline Phosphatase: 88 U/L (ref 38–126)
Anion gap: 11 (ref 5–15)
BUN: 16 mg/dL (ref 8–23)
CO2: 20 mmol/L — ABNORMAL LOW (ref 22–32)
Calcium: 8.2 mg/dL — ABNORMAL LOW (ref 8.9–10.3)
Chloride: 103 mmol/L (ref 98–111)
Creatinine, Ser: 1.26 mg/dL — ABNORMAL HIGH (ref 0.61–1.24)
GFR calc Af Amer: >60 mL/min (ref >60)
GFR calc non Af Amer: 55 mL/min — ABNORMAL LOW (ref >60)
Glucose, Bld: 93 mg/dL (ref 70–99)
Potassium: 4.5 mmol/L (ref 3.5–5.1)
Sodium: 134 mmol/L — ABNORMAL LOW (ref 135–145)
Total Bilirubin: 0.7 mg/dL (ref 0.3–1.2)
Total Protein: 6.7 g/dL (ref 6.5–8.1)

## 2020-04-13 LAB — BASIC METABOLIC PANEL
Anion gap: 8 (ref 5–15)
BUN: 16 mg/dL (ref 8–23)
CO2: 22 mmol/L (ref 22–32)
Calcium: 8.4 mg/dL — ABNORMAL LOW (ref 8.9–10.3)
Chloride: 106 mmol/L (ref 98–111)
Creatinine, Ser: 1.23 mg/dL (ref 0.61–1.24)
GFR calc Af Amer: >60 mL/min (ref >60)
GFR calc non Af Amer: 56 mL/min — ABNORMAL LOW (ref >60)
Glucose, Bld: 87 mg/dL (ref 70–99)
Potassium: 4.1 mmol/L (ref 3.5–5.1)
Sodium: 136 mmol/L (ref 135–145)

## 2020-04-13 LAB — CBC WITH DIFFERENTIAL/PLATELET
Abs Immature Granulocytes: 0.02 10*3/uL (ref 0.00–0.07)
Basophils Absolute: 0 10*3/uL (ref 0.0–0.1)
Basophils Relative: 0 %
Eosinophils Absolute: 0 10*3/uL (ref 0.0–0.5)
Eosinophils Relative: 0 %
HCT: 42.9 % (ref 39.0–52.0)
Hemoglobin: 12.9 g/dL — ABNORMAL LOW (ref 13.0–17.0)
Immature Granulocytes: 0 %
Lymphocytes Relative: 20 %
Lymphs Abs: 1 10*3/uL (ref 0.7–4.0)
MCH: 24.9 pg — ABNORMAL LOW (ref 26.0–34.0)
MCHC: 30.1 g/dL (ref 30.0–36.0)
MCV: 82.7 fL (ref 80.0–100.0)
Monocytes Absolute: 0.4 10*3/uL (ref 0.1–1.0)
Monocytes Relative: 7 %
Neutro Abs: 3.6 10*3/uL (ref 1.7–7.7)
Neutrophils Relative %: 73 %
Platelets: 214 10*3/uL (ref 150–400)
RBC: 5.19 MIL/uL (ref 4.22–5.81)
RDW: 16.5 % — ABNORMAL HIGH (ref 11.5–15.5)
WBC: 5 10*3/uL (ref 4.0–10.5)
nRBC: 0 % (ref 0.0–0.2)

## 2020-04-13 LAB — GLUCOSE, CAPILLARY
Glucose-Capillary: 90 mg/dL (ref 70–99)
Glucose-Capillary: 108 mg/dL — ABNORMAL HIGH (ref 70–99)
Glucose-Capillary: 127 mg/dL — ABNORMAL HIGH (ref 70–99)

## 2020-04-13 LAB — C-REACTIVE PROTEIN: CRP: 6.4 mg/dL — ABNORMAL HIGH (ref <1.0)

## 2020-04-13 LAB — ECHOCARDIOGRAM COMPLETE
Area-P 1/2: 1.79 cm2
Height: 71 in
Weight: 2448 oz

## 2020-04-13 LAB — ABO/RH: ABO/RH(D): O POS

## 2020-04-13 LAB — D-DIMER, QUANTITATIVE: D-Dimer, Quant: 2.03 ug/mL-FEU — ABNORMAL HIGH (ref 0.00–0.50)

## 2020-04-13 MED ORDER — PERFLUTREN LIPID MICROSPHERE
1.0000 mL | INTRAVENOUS | Status: AC | PRN
  Administered 2020-04-13: 3 mL via INTRAVENOUS
  Filled 2020-04-13: qty 10

## 2020-04-13 MED ORDER — GUAIFENESIN-DM 100-10 MG/5ML PO SYRP: 10.0000 mL | ORAL_SOLUTION | ORAL | Status: DC | PRN

## 2020-04-13 MED ORDER — HYDROCOD POLST-CPM POLST ER 10-8 MG/5ML PO SUER: 5.0000 mL | Freq: Two times a day (BID) | ORAL | Status: DC | PRN

## 2020-04-13 MED ORDER — ASCORBIC ACID 500 MG PO TABS
500.0000 mg | ORAL_TABLET | Freq: Every day | ORAL | Status: DC
  Administered 2020-04-13 – 2020-04-14 (×2): 500 mg via ORAL
  Filled 2020-04-13 (×2): qty 1

## 2020-04-13 MED ORDER — ENOXAPARIN SODIUM 40 MG/0.4ML ~~LOC~~ SOLN
40.0000 mg | SUBCUTANEOUS | Status: DC
  Administered 2020-04-13 – 2020-04-14 (×2): 40 mg via SUBCUTANEOUS
  Filled 2020-04-13 (×2): qty 0.4

## 2020-04-13 MED ORDER — VITAMIN D 25 MCG (1000 UNIT) PO TABS
1000.0000 [IU] | ORAL_TABLET | Freq: Every day | ORAL | Status: DC
  Administered 2020-04-13 – 2020-04-14 (×2): 1000 [IU] via ORAL
  Filled 2020-04-13 (×2): qty 1

## 2020-04-13 MED ORDER — ZINC SULFATE 220 (50 ZN) MG PO CAPS
220.0000 mg | ORAL_CAPSULE | Freq: Every day | ORAL | Status: DC
  Administered 2020-04-13 – 2020-04-14 (×2): 220 mg via ORAL
  Filled 2020-04-13 (×2): qty 1

## 2020-04-13 MED ORDER — ACETAMINOPHEN 325 MG PO TABS: 650.0000 mg | ORAL_TABLET | Freq: Four times a day (QID) | ORAL | Status: DC | PRN

## 2020-04-13 MED ORDER — METHYLPREDNISOLONE SODIUM SUCC 40 MG IJ SOLR
0.5000 mg/kg | Freq: Two times a day (BID) | INTRAMUSCULAR | Status: DC
  Administered 2020-04-13 – 2020-04-14 (×3): 34.8 mg via INTRAVENOUS
  Filled 2020-04-13 (×3): qty 1

## 2020-04-13 NOTE — Progress Notes (Signed)
Pt transferred to 5N in stable condition. All belongings taken with Pt. Report given to receiving RN.

## 2020-04-13 NOTE — Progress Notes (Signed)
Patient admitted to 68w29. Alert and oriented x 4. Admissions paged for orders.

## 2020-04-13 NOTE — Progress Notes (Signed)
PROGRESS NOTE                                                                                                                                                                                                             Patient Demographics:    Seth [name], is a 79 y.o. male, DOB - 1941/03/28, OFB:[id number]  Outpatient Primary MD for the patient is Drosinis, Pamalee Leyden, PA-C   Admit date - 04/12/2020   LOS - 0  Chief Complaint  Patient presents with  . Dizziness    + covid       Brief Narrative: Patient is a 79 y.o. male with PMHx of HTN, HLD-recently-completed his second dose of COVID-19 vaccine (less than 14 weeks back per patient)-presenting with weakness-he was found to have COVID-19 infection with pneumonia and mild hypoxemia and subsequently admitted to the hospitalist service.  See below for further details.   COVID-19 vaccinated status: vaccinated  Significant Events: 9/2>> Admit to [facility] for mild hypoxia due to COVID-19 pneumonia  Significant studies: 9/2>>Chest x-ray: Vague area of opacity in the left hemidiaphragm-may represent developing infection.  COVID-19 medications: Steroids: 9/2>> Remdesivir: 9/2>>  Antibiotics: None  Microbiology data: 9/2 >>blood culture: No growth  Procedures: None  Consults: None  DVT prophylaxis: enoxaparin (LOVENOX) injection 40 mg Start: 04/13/20 0630    Subjective:    Seth [name] today was lying comfortably in bed-he is a very poor historian but thinks that he is doing better than yesterday.  On room air this morning.   Assessment  & Plan :   Acute Hypoxic Resp Failure due to Covid 19 Viral pneumonia: Appears to have mild hypoxemia-continue steroids/remdesivir.  Follow inflammatory markers.  Needs to be ambulated before consideration of discharge.  Do not think patient had sepsis physiology on admission-sepsis ruled out.  Fever: afebrile O2 requirements:    SpO2: 90 %   COVID-19 Labs: Recent Labs    04/12/20 1340 04/13/20 0930  DDIMER 2.01* 2.03*  FERRITIN 98  --   LDH 429*  --   CRP 5.6* 6.4*       Component Value Date/Time   BNP 25.8 11/08/2017 1058    Recent Labs  Lab 04/12/20 1340  PROCALCITON 0.20    Lab Results  Component Value Date   SARSCOV2NAA POSITIVE (A) 04/12/2020    Prone/Incentive Spirometry: encouraged  incentive spirometry use 3-4/hour.  AKI: Very mild-likely hemodynamically mediated-plan is to monitor without initiating further work-continue supportive care.  Transaminitis: Mild-unlikely secondary to COVID-19-follow.  HLD: Await medication reconciliation by pharmacy before initiating statins.  HTN: BP controlled-not on any antihypertensives-await medication reconciliation by pharmacy.  Minimally elevated troponin: Does not have chest pain-trend is flat-very minimally elevated-doubt of any clinical significance.  EKG nonacute.  Doubt any further work-up required.  Weakness/debility/deconditioning: Secondary to COVID-19-await PT/OT eval.   ABG: No results found for: PHART, PCO2ART, PO2ART, HCO3, TCO2, ACIDBASEDEF, O2SAT  Vent Settings: N/A  Condition - Stable  Family Communication  :  Spouse Benjamine Mola [phone]) updated over the phone 9/3  Code Status :  Full Code  Diet :  Diet Order            Diet Heart Room service appropriate? Yes; Fluid consistency: Thin  Diet effective now                  Disposition Plan  :   Status is: Inpatient  Remains inpatient appropriate because:Inpatient level of care appropriate due to severity of illness   Dispo: The patient is from: Home              Anticipated d/c is to: Home              Anticipated d/c date is: 2 days              Patient currently is not medically stable to d/c.         Barriers to discharge: Hypoxia requiring O2 supplementation/complete 5 days of IV Remdesivir  Antimicorbials  :    Anti-infectives (From  admission, onward)   Start     Dose/Rate Route Frequency Ordered Stop   04/13/20 1000  remdesivir 100 mg in sodium chloride 0.9 % 100 mL IVPB        100 mg 200 mL/hr over 30 Minutes Intravenous Daily 04/12/20 1552 04/17/20 0959   04/12/20 1630  remdesivir 100 mg in sodium chloride 0.9 % 100 mL IVPB        100 mg 200 mL/hr over 30 Minutes Intravenous Every 30 min 04/12/20 1552 04/12/20 1810      Inpatient Medications  Scheduled Meds: . vitamin C  500 mg Oral Daily  . cholecalciferol  1,000 Units Oral Daily  . enoxaparin (LOVENOX) injection  40 mg Subcutaneous Q24H  . methylPREDNISolone (SOLU-MEDROL) injection  0.5 mg/kg Intravenous Q12H  . zinc sulfate  220 mg Oral Daily   Continuous Infusions: . remdesivir 100 mg in NS 100 mL 100 mg (04/13/20 0917)   PRN Meds:.acetaminophen, chlorpheniramine-HYDROcodone, guaiFENesin-dextromethorphan   Time Spent in minutes  25  See all Orders from today for further details   Oren Binet M.D on 04/13/2020 at 12:47 PM  To page go to [email or web address] - use universal password  [facility] -  Office  [phone]    Objective:   Vitals:   04/13/20 0126 04/13/20 0400 04/13/20 0747 04/13/20 1149  BP: (!) 133/100 126/85 (!) 143/96 (!) 134/96  Pulse: 100 86 89 83  Resp: 19 20 16 20   Temp: 98.2 F (36.8 C) 99.4 F (37.4 C) 99.4 F (37.4 C) 99.7 F (37.6 C)  TempSrc: Oral Oral Oral Oral  SpO2: 98% (!) 89% 90% 90%  Weight:      Height:        Wt Readings from Last 3 Encounters:  04/12/20 69.4 kg  09/27/18 112.5 kg  08/31/18  113.4 kg     Intake/Output Summary (Last 24 hours) at 04/13/2020 1247 Last data filed at 04/13/2020 0900 Gross per 24 hour  Intake 1147.04 ml  Output --  Net 1147.04 ml     Physical Exam Gen Exam:Alert awake-not in any distress HEENT:atraumatic, normocephalic Chest: B/L clear to auscultation anteriorly CVS:S1S2 regular Abdomen:soft non tender, non distended Extremities:no edema Neurology: Non  focal Skin: no rash   Data Review:    CBC Recent Labs  Lab 04/12/20 1137 04/13/20 0930  WBC 4.0 5.0  HGB 13.9 12.9*  HCT 43.8 42.9  PLT 226 214  MCV 80.5 82.7  MCH 25.6* 24.9*  MCHC 31.7 30.1  RDW 16.1* 16.5*  LYMPHSABS  --  1.0  MONOABS  --  0.4  EOSABS  --  0.0  BASOSABS  --  0.0    Chemistries  Recent Labs  Lab 04/12/20 1137 04/13/20 0652 04/13/20 0930  NA 135 136 134*  K 3.9 4.1 4.5  CL 102 106 103  CO2 20* 22 20*  GLUCOSE 118* 87 93  BUN 21 16 16   CREATININE 1.47* 1.23 1.26*  CALCIUM 8.4* 8.4* 8.2*  AST  --   --  105*  ALT  --   --  56*  ALKPHOS  --   --  88  BILITOT  --   --  0.7   ------------------------------------------------------------------------------------------------------------------ Recent Labs    04/12/20 1340  TRIG 109    No results found for: HGBA1C ------------------------------------------------------------------------------------------------------------------ No results for input(s): TSH, T4TOTAL, T3FREE, THYROIDAB in the last 72 hours.  Invalid input(s): FREET3 ------------------------------------------------------------------------------------------------------------------ Recent Labs    04/12/20 1340  FERRITIN 98    Coagulation profile No results for input(s): INR, PROTIME in the last 168 hours.  Recent Labs    04/12/20 1340 04/13/20 0930  DDIMER 2.01* 2.03*    Cardiac Enzymes No results for input(s): CKMB, TROPONINI, MYOGLOBIN in the last 168 hours.  Invalid input(s): CK ------------------------------------------------------------------------------------------------------------------    Component Value Date/Time   BNP 25.8 11/08/2017 1058    Micro Results Recent Results (from the past 240 hour(s))  SARS Coronavirus 2 by RT PCR (hospital order, performed in [facility] hospital lab) Nasopharyngeal Nasopharyngeal Swab     Status: Abnormal   Collection Time: 04/12/20 11:37 AM   Specimen: Nasopharyngeal  Swab  Result Value Ref Range Status   SARS Coronavirus 2 POSITIVE (A) NEGATIVE Final    Comment: RESULT CALLED TO, READ BACK BY AND VERIFIED WITH: MARVA SIMMS RN @1235  04/12/2020 OLSONM (NOTE) SARS-CoV-2 target nucleic acids are DETECTED  SARS-CoV-2 RNA is generally detectable in upper respiratory specimens  during the acute phase of infection.  Positive results are indicative  of the presence of the identified virus, but do not rule out bacterial infection or co-infection with other pathogens not detected by the test.  Clinical correlation with patient history and  other diagnostic information is necessary to determine patient infection status.  The expected result is negative.  Fact Sheet for Patients:   StrictlyIdeas.no   Fact Sheet for Healthcare Providers:   BankingDealers.co.za    This test is not yet approved or cleared by the [place] FDA and  has been authorized for detection and/or diagnosis of SARS-CoV-2 by FDA under an Emergency Use Authorization (EUA).  This EUA will remain in effect (meaning thi s test can be used) for the duration of  the COVID-19 declaration under Section 564(b)(1) of the Act, 21 U.S.C. section 360-bbb-3(b)(1), unless the authorization is terminated or  revoked sooner.  Performed at [facility], [address] [place], [place] [address]   Blood Culture (routine x 2)     Status: None (Preliminary result)   Collection Time: 04/12/20  1:41 PM   Specimen: BLOOD  Result Value Ref Range Status   Specimen Description   Final    BLOOD RIGHT UPPER ARM Performed at [facility], [address] [place], [place] [address]    Special Requests   Final    BOTTLES DRAWN AEROBIC AND ANAEROBIC Blood Culture adequate volume Performed at [facility], [address] [place], [place] [address]    Culture   Final    NO GROWTH < 12 HOURS Performed at [facility],  [place] [address], [place], [place] [address]    Report Status PENDING  Incomplete    Radiology Reports DG Chest Portable 1 View  Result Date: 04/12/2020 CLINICAL DATA:  Dizziness, UTI EXAM: PORTABLE CHEST 1 VIEW COMPARISON:  August 30, 2018 FINDINGS: Trachea midline. Lung volumes are diminished with persistent elevation of the LEFT hemidiaphragm and juxta diaphragmatic atelectasis. Slightly increased airspace opacity over the LEFT hemidiaphragm aside from expected changes of atelectasis. No lobar consolidation. No pleural effusion. On limited assessment skeletal structures without acute process. IMPRESSION: 1. Persistent elevation of the LEFT hemidiaphragm and juxta diaphragmatic atelectasis. 2. Area of vague opacity over lying the LEFT hemidiaphragm may reflect more pronounced atelectatic change, developing infection in this area is not excluded Electronically Signed   By: Zetta Bills M.D.   On: 04/12/2020 11:49

## 2020-04-13 NOTE — Progress Notes (Signed)
  Echocardiogram 2D Echocardiogram has been performed.  Fidel Levy 04/13/2020, 4:18 PM

## 2020-04-13 NOTE — Progress Notes (Signed)
Pt arrived to the unit. Pt not in distress and tolerated well. 

## 2020-04-13 NOTE — H&P (Addendum)
History and Physical    Seth [name]:[id number] DOB: 1941-07-30 DOA: 04/12/2020  PCP: Mina Marble, PA-C Patient coming from: [facility]  Chief Complaint: "I twisted my back"  HPI: Seth [name] is a 79 y.o. male with medical history significant of history of prostate cancer, hypertension presented to the ED with complaints of dizziness and generalized weakness.  Patient is a poor historian.  He denies any dizziness or weakness.  Patient reports having chronic back pain.  States he went to the ED as he twisted his lower back while laying in the bed yesterday.  Denies any recent falls.  States he is no longer experiencing any back pain at this time.  He denies any other symptoms including fevers, chills, fatigue, cough, shortness of breath, chest pain, nausea, vomiting, abdominal pain, diarrhea, or dysuria.  States he received 2 doses of Covid vaccine last month.  Denies history of CAD.  ED Course: Febrile with temperature 100.5 F. Not tachycardic. Not hypoxic at rest but oxygen saturation dropped to 91-92% and became tachypneic and dyspneic when walking in the room. SARS-CoV-2 PCR test positive. WBC 4.0, hemoglobin 13.9, hematocrit 43.8, platelet 226. Sodium 135, potassium 3.9, chloride 102, bicarb 20, BUN 21, creatinine 1.4, glucose 118. Anion gap 13. Baseline creatinine 1.0. Initial high-sensitivity troponin 136, repeat stable at 123. EKG without acute ischemic changes. Blood culture x2 pending. Lactic acid normal. Procalcitonin 0.20. Inflammatory markers elevated: D-dimer 2.0, LDH 429, fibrinogen 536, and CRP 5.6.  Chest x-ray showing persistent elevation of the left hemidiaphragm and juxtadiaphragmatic atelectasis. Area of vague opacity overlying the left hemidiaphragm, may reflect atelectasis versus developing infection.  Patient was given Tylenol, aspirin 324 mg, Percocet, remdesivir, and 1 L normal saline bolus.  Review of Systems:  All systems reviewed and apart from history of  presenting illness, are negative.  Past Medical History:  Diagnosis Date  . Arthritis   . Cancer (HCC)    hx of prostate cancer   . History of kidney stones   . Hypertension   . Pneumonia    06/2015     Past Surgical History:  Procedure Laterality Date  . PROSTATECTOMY    . 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 His smoking use included cigarettes. He quit after 15.00 years of use. He has never used smokeless tobacco. He reports previous alcohol use. He reports that he does not use drugs.  No Known Allergies  History reviewed. No pertinent family history.  Prior to Admission medications   Medication Sig Start Date End Date Taking? Authorizing Provider  albuterol (PROVENTIL HFA;VENTOLIN HFA) 108 (90 Base) MCG/ACT inhaler Inhale 1-2 puffs into the lungs every 6 (six) hours as needed for wheezing or shortness of breath. Patient not taking: Reported on 08/30/2018 11/08/17   Tegeler, Gwenyth Allegra, MD  aspirin 500 MG tablet Take 500 mg by mouth every 6 (six) hours as needed for pain.    [provider]  atorvastatin (LIPITOR) 40 MG tablet Take 40 mg by mouth every morning.  08/17/15   [provider]  augmented betamethasone dipropionate (DIPROLENE) 0.05 % ointment Apply topically 2 (two) times  daily. Patient not taking: Reported on 08/30/2018 12/10/15   Domenic Moras, PA-C  Brinzolamide-Brimonidine [facility]) 1-0.2 % SUSP Place 1 drop into both eyes 2 (two) times daily.     [provider]  fluocinonide ointment (LIDEX) 1.85 % Apply 1 application topically 2 (two) times daily. 12/10/15   Domenic Moras, PA-C    methocarbamol (ROBAXIN) 500 MG tablet Take 1 tablet (500 mg total) by mouth every 6 (six) hours as needed for muscle spasms. 08/18/18   Edmisten, Ok Anis, PA  rivaroxaban (XARELTO) 10 MG TABS tablet Take 1 tablet (10 mg total) by mouth daily with breakfast for 19 days. Then take one 81 mg aspirin once a day for three weeks. Then discontinue aspirin. 08/18/18 09/06/18  Edmisten, Ok Anis, PA  traMADol (ULTRAM) 50 MG tablet Take 1-2 tablets (50-100 mg total) by mouth every 6 (six) hours as needed for moderate pain. 08/18/18   Edmisten, Kristie L, PA  TRAVATAN Z 0.004 % SOLN ophthalmic solution Place 1 drop into both eyes at bedtime. 07/16/15   [provider]    Physical Exam: Vitals:   04/12/20 2300 04/13/20 0001 04/13/20 0126 04/13/20 0400  BP: (!) 145/95 (!) 135/97 (!) 133/100 126/85  Pulse: 94 93 100 86  Resp:  (!) 26 19 20   Temp:  (!) 100.5 F (38.1 C) 98.2 F (36.8 C) 99.4 F (37.4 C)  TempSrc:  Oral Oral Oral  SpO2: 95% 94% 98% (!) 89%  Weight:      Height:        Physical Exam Constitutional:      General: He is not in acute distress. HENT:     Head: Normocephalic and atraumatic.  Eyes:     Extraocular Movements: Extraocular movements intact.     Conjunctiva/sclera: Conjunctivae normal.  Cardiovascular:     Rate and Rhythm: Normal rate and regular rhythm.     Pulses: Normal pulses.  Pulmonary:     Effort: Pulmonary effort is normal.     Breath sounds: No wheezing or rales.     Comments: Satting around 90% on room air at rest Abdominal:     General: Bowel sounds are normal. There is no distension.     Palpations: Abdomen is soft.     Tenderness: There is no abdominal tenderness.  Musculoskeletal:        General: No tenderness.     Cervical back: Normal range of motion and neck supple.     Comments: Mild nonpitting edema at the dorsal of the feet Spine nontender to palpation.  No paraspinal muscle tenderness.  Skin:    General: Skin is warm and dry.   Neurological:     General: No focal deficit present.     Mental Status: He is alert and oriented to person, place, and time.     Labs on Admission: I have personally reviewed following labs and imaging studies  CBC: Recent Labs  Lab 04/12/20 1137  WBC 4.0  HGB 13.9  HCT 43.8  MCV 80.5  PLT 631   Basic Metabolic Panel: Recent Labs  Lab 04/12/20 1137  NA 135  K 3.9  CL 102  CO2 20*  GLUCOSE 118*  BUN 21  CREATININE 1.47*  CALCIUM 8.4*   GFR: Estimated Creatinine Clearance: 41.3 mL/min (A) (by C-G formula based on SCr of 1.47 mg/dL (H)). Liver Function Tests: No results for input(s): AST, ALT, ALKPHOS, BILITOT, PROT, ALBUMIN in the last 168 hours. No results for input(s): LIPASE, AMYLASE in the  last 168 hours. No results for input(s): AMMONIA in the last 168 hours. Coagulation Profile: No results for input(s): INR, PROTIME in the last 168 hours. Cardiac Enzymes: No results for input(s): CKTOTAL, CKMB, CKMBINDEX, TROPONINI in the last 168 hours. BNP (last 3 results) No results for input(s): PROBNP in the last 8760 hours. HbA1C: No results for input(s): HGBA1C in the last 72 hours. CBG: No results for input(s): GLUCAP in the last 168 hours. Lipid Profile: Recent Labs    04/12/20 1340  TRIG 109   Thyroid Function Tests: No results for input(s): TSH, T4TOTAL, FREET4, T3FREE, THYROIDAB in the last 72 hours. Anemia Panel: Recent Labs    04/12/20 1340  FERRITIN 98   Urine analysis:    Component Value Date/Time   COLORURINE AMBER (A) 04/12/2020 1430   APPEARANCEUR CLEAR 04/12/2020 1430   LABSPEC >1.030 (H) 04/12/2020 1430   PHURINE 5.5 04/12/2020 1430   GLUCOSEU NEGATIVE 04/12/2020 1430   HGBUR MODERATE (A) 04/12/2020 1430   BILIRUBINUR SMALL (A) 04/12/2020 1430   KETONESUR NEGATIVE 04/12/2020 1430   PROTEINUR 100 (A) 04/12/2020 1430   NITRITE NEGATIVE 04/12/2020 1430   LEUKOCYTESUR NEGATIVE 04/12/2020 1430    Radiological Exams on Admission: DG  Chest Portable 1 View  Result Date: 04/12/2020 CLINICAL DATA:  Dizziness, UTI EXAM: PORTABLE CHEST 1 VIEW COMPARISON:  August 30, 2018 FINDINGS: Trachea midline. Lung volumes are diminished with persistent elevation of the LEFT hemidiaphragm and juxta diaphragmatic atelectasis. Slightly increased airspace opacity over the LEFT hemidiaphragm aside from expected changes of atelectasis. No lobar consolidation. No pleural effusion. On limited assessment skeletal structures without acute process. IMPRESSION: 1. Persistent elevation of the LEFT hemidiaphragm and juxta diaphragmatic atelectasis. 2. Area of vague opacity over lying the LEFT hemidiaphragm may reflect more pronounced atelectatic change, developing infection in this area is not excluded Electronically Signed   By: Zetta Bills M.D.   On: 04/12/2020 11:49    EKG: Independently reviewed. Sinus tachycardia, no acute ischemic changes.  Assessment/Plan Principal Problem:   Pneumonia due to COVID-19 virus Active Problems:   AKI (acute kidney injury) ([place])   Sepsis ([place])   Acute hypoxemic respiratory failure (HCC)   Elevated troponin   Sepsis and acute hypoxemic respiratory failure secondary to suspected COVID-19 viral pneumonia: Febrile with temperature 100.5 F.  Currently satting around 90% on room air at rest.  Became tachypneic dyspneic with minimal ambulation in the ED.  SARS-CoV-2 PCR test positive. Chest x-ray with opacity overlying the left hemidiaphragm suspicious for developing pneumonia. Bacterial pneumonia less likely given no leukocytosis or significant elevation of procalcitonin. Inflammatory markers elevated: D-dimer 2.0, LDH 429, fibrinogen 536, and CRP 5.6. -Remdesivir dosing per pharmacy -IV Solu-Medrol 0.5 mg/kg every 12 hours -Vitamin C, zinc, vitamin D -Antitussives as needed -Tylenol as needed -Daily CBC with differential, CMP, CRP, D-dimer -Airborne and contact precautions -Incentive spirometry, flutter  valve -Continuous pulse ox -Supplemental oxygen as needed to keep oxygen saturation above 90% -Blood culture x2 pending  Elevated troponin:  Initial high-sensitivity troponin 136, repeat stable at 123. Patient is not endorsing any anginal symptoms. ?Viral myocarditis although EKG without acute changes. -Cardiac monitoring. Patient received aspirin 324 mg in the ED. Echocardiogram ordered.  Mild AKI: BUN 21, creatinine 1.4. Baseline creatinine 1.0.  Possibly prerenal due to poor oral intake in the setting of acute viral illness. -Received IV fluid bolus. Repeat metabolic panel.   Mild normal anion gap metabolic acidosis: Bicarb 20, anion gap 13. Likely due to AKI. -Received IV  fluid bolus. Repeat metabolic panel.  Low back pain: Patient reports "twisting" his back in bed yesterday.  Denies any recent falls.  Denies any back pain at present.  Spine nontender to palpation.  Appears comfortable.  No lower extremity weakness on exam.  Not endorsing any other red flag symptoms such as saddle anesthesia or urinary/fecal incontinence.  Back pain could have possibly been due to muscle spasm. -Continue to monitor, order imaging if there is recurrence of back pain  Hypertension: Currently normotensive.  Not on any antihypertensives at home. -Continue to monitor  DVT prophylaxis: Xarelto listed on home medications but per chart review it seems it was prescribed to him back in 2017 for DVT prophylaxis after a knee surgery.  Patient states he is no longer on this medication. Will order Lovenox for DVT prophylaxis. Code Status: Full code-discussed with the patient. Family Communication: No family available at this time. Disposition Plan: Status is: Inpatient  Remains inpatient appropriate because:IV treatments appropriate due to intensity of illness or inability to take PO and Inpatient level of care appropriate due to severity of illness   Dispo: The patient is from: Home              Anticipated d/c  is to: Home              Anticipated d/c date is: 3 days              Patient currently is not medically stable to d/c.  The medical decision making on this patient was of high complexity and the patient is at high risk for clinical deterioration, therefore this is a level 3 visit.  Shela Leff MD [facility]  If 7PM-7AM, please contact night-coverage [email or web address]  04/13/2020, 6:20 AM

## 2020-04-13 NOTE — Evaluation (Signed)
Physical Therapy Evaluation Patient Details Name: Seth [name] MRN: [id number] DOB: 12-Mar-1941 Today's Date: 04/13/2020   History of Present Illness  79yo male who recently received the covid vaccine, now presenting with gross weakness. Found to be covid positive with pneumonia and mild hypoxemia. PMH prostate CA, HTN, B TKA  Clinical Impression   Patient received in bed, sleeping but easily woken, very cooperative with therapy today. 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 Tolerated gait 51f then 121fwith RW, min guard and SpO2 89-92% on room air. Incontinent of urine. Left up in recliner with all needs met this afternoon. If he truly is at his functional baseline and family can provide appropriate amounts of physical assist, OK to return home and begin OP PT once cleared from isolation period; if not, he will require SNF.     Follow Up Recommendations Outpatient PT;Supervision/Assistance - 24 hour (only if his family can provide this level of physical assist- if they cannot, he will need SNF)    Equipment Recommendations  3in1 (PT)    Recommendations for Other Services       Precautions / Restrictions Precautions Precautions: Fall;Other (comment) Precaution Comments: incontinent of urine, Covid + Restrictions Weight Bearing Restrictions: No      Mobility  Bed Mobility Overal bed mobility: Needs Assistance Bed Mobility: Supine to Sit     Supine to sit: Mod assist     General bed mobility comments: ModA for supine to sit due to difficulty bringing trunk up and scooting hips around, initially needed MinA for balance but this faded to close S  Transfers Overall transfer level: Needs  assistance Equipment used: Rolling walker (2 wheeled) Transfers: Sit to/from Stand Sit to Stand: Min assist         General transfer comment: MinA to boost to full standing position and gain balance with RW  Ambulation/Gait Ambulation/Gait assistance: Min guard Gait Distance (Feet): 25 Feet ([id number] device: Rolling walker (2 wheeled) Gait Pattern/deviations: Step-through pattern;Decreased step length - right;Decreased step length - left;Decreased dorsiflexion - right;Decreased dorsiflexion - left;Narrow base of support;Trunk flexed;Drifts right/left Gait velocity: decreased   General Gait Details: slow but steady with RW, did need max cues for sequencing with RW and navigation in room. Incontinent of urine in standing  Stairs            Wheelchair Mobility    Modified Rankin (Stroke Patients Only)       Balance Overall balance assessment: Needs assistance Sitting-balance support: Feet supported;Bilateral upper extremity supported Sitting balance-Leahy Scale: Fair Sitting balance - Comments: did not challenge dynamically, initially needed light assist to maintain balance but this faded to S   Standing balance support: Bilateral upper extremity supported;During functional activity Standing balance-Leahy Scale: Fair Standing balance comment: reliant on BUe support on RW but able to reach up with one had to adjust BP cuff without significant unsteadiness                             Pertinent Vitals/Pain Pain Assessment: No/denies pain    Home Living Family/patient expects to be discharged to:: Private residence Living Arrangements: Spouse/significant other Available Help at Discharge:  Family;Available 24 hours/day Type of Home: House Home Access: Stairs to enter Entrance Stairs-Rails: Right Entrance Stairs-Number of Steps: 10 Home Layout: Two level Home Equipment: Environmental consultant - 2 wheels      Prior Function Level of Independence: Independent  with assistive device(s)         Comments: very independent with RW but limited to household distances at baseline     Hand Dominance        Extremity/Trunk Assessment   Upper Extremity Assessment Upper Extremity Assessment: Defer to OT evaluation    Lower Extremity Assessment Lower Extremity Assessment: Generalized weakness    Cervical / Trunk Assessment Cervical / Trunk Assessment: Kyphotic  Communication   Communication: No difficulties  Cognition Arousal/Alertness: Awake/alert Behavior During Therapy: WFL for tasks assessed/performed;Flat affect Overall Cognitive Status: No family/caregiver present to determine baseline cognitive functioning                                 General Comments: pleasant and cooperative, followed commands well but yet seemed a bit functionally confused at times- needed max cues for sequencing with some tasks during PT session      General Comments      Exercises     Assessment/Plan    PT Assessment Patient needs continued PT services  PT Problem List Decreased strength;Decreased activity tolerance;Decreased safety awareness;Decreased knowledge of use of DME;Decreased balance;Decreased mobility;Decreased coordination;Cardiopulmonary status limiting activity       PT Treatment Interventions DME instruction;Balance training;Gait training;Stair training;Cognitive remediation;Functional mobility training;Patient/family education;Therapeutic activities;Therapeutic exercise    PT Goals (Current goals can be found in the Care Plan section)  Acute Rehab PT Goals 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 PT Goal Formulation: With patient Time For Goal Achievement: 04/27/20 Potential to Achieve Goals: Fair    Frequency Min 3X/week   Barriers to discharge        Co-evaluation               AM-PAC PT "6 Clicks" Mobility  Outcome Measure Help needed turning from your back to your side  while in a flat bed without using bedrails?: A Little Help needed moving from lying on your back to sitting on the side of a flat bed without using bedrails?: A Lot Help needed moving to and from a bed to a chair (including a wheelchair)?: A Little Help needed standing up from a chair using your arms (e.g., wheelchair or bedside chair)?: A Little Help needed to walk in hospital room?: A Little Help needed climbing 3-5 steps with a railing? : A Lot 6 Click Score: 16    End of Session   Activity Tolerance: Patient tolerated treatment well Patient left: in chair;with call bell/phone within reach Nurse Communication: Mobility status PT Visit Diagnosis: Unsteadiness on feet (R26.81);Difficulty in walking, not elsewhere classified (R26.2);Muscle weakness (generalized) (M62.81)    Time: 7793-9030 PT Time Calculation (min) (ACUTE ONLY): 38 min   Charges:   PT Evaluation $PT Eval Moderate Complexity: 1 Mod PT Treatments $Gait Training: 8-22 mins $Therapeutic Activity: 8-22 mins        Windell Norfolk, DPT, PN1   Supplemental Physical Therapist [place]    Pager [phone] Acute Rehab Office [phone]

## 2020-04-14 LAB — CBC WITH DIFFERENTIAL/PLATELET
Abs Immature Granulocytes: 0.01 10*3/uL (ref 0.00–0.07)
Basophils Absolute: 0 10*3/uL (ref 0.0–0.1)
Basophils Relative: 0 %
Eosinophils Absolute: 0 10*3/uL (ref 0.0–0.5)
Eosinophils Relative: 0 %
HCT: 41.8 % (ref 39.0–52.0)
Hemoglobin: 13 g/dL (ref 13.0–17.0)
Immature Granulocytes: 0 %
Lymphocytes Relative: 16 %
Lymphs Abs: 0.6 10*3/uL — ABNORMAL LOW (ref 0.7–4.0)
MCH: 25.1 pg — ABNORMAL LOW (ref 26.0–34.0)
MCHC: 31.1 g/dL (ref 30.0–36.0)
MCV: 80.7 fL (ref 80.0–100.0)
Monocytes Absolute: 0.2 10*3/uL (ref 0.1–1.0)
Monocytes Relative: 6 %
Neutro Abs: 2.8 10*3/uL (ref 1.7–7.7)
Neutrophils Relative %: 78 %
Platelets: 197 10*3/uL (ref 150–400)
RBC: 5.18 MIL/uL (ref 4.22–5.81)
RDW: 16.2 % — ABNORMAL HIGH (ref 11.5–15.5)
WBC: 3.6 10*3/uL — ABNORMAL LOW (ref 4.0–10.5)
nRBC: 0 % (ref 0.0–0.2)

## 2020-04-14 LAB — COMPREHENSIVE METABOLIC PANEL
ALT: 76 U/L — ABNORMAL HIGH (ref 0–44)
AST: 158 U/L — ABNORMAL HIGH (ref 15–41)
Albumin: 2.8 g/dL — ABNORMAL LOW (ref 3.5–5.0)
Alkaline Phosphatase: 89 U/L (ref 38–126)
Anion gap: 10 (ref 5–15)
BUN: 18 mg/dL (ref 8–23)
CO2: 21 mmol/L — ABNORMAL LOW (ref 22–32)
Calcium: 8.5 mg/dL — ABNORMAL LOW (ref 8.9–10.3)
Chloride: 105 mmol/L (ref 98–111)
Creatinine, Ser: 1.31 mg/dL — ABNORMAL HIGH (ref 0.61–1.24)
GFR calc Af Amer: >60 mL/min (ref >60)
GFR calc non Af Amer: 52 mL/min — ABNORMAL LOW (ref >60)
Glucose, Bld: 121 mg/dL — ABNORMAL HIGH (ref 70–99)
Potassium: 4.4 mmol/L (ref 3.5–5.1)
Sodium: 136 mmol/L (ref 135–145)
Total Bilirubin: 0.7 mg/dL (ref 0.3–1.2)
Total Protein: 6.5 g/dL (ref 6.5–8.1)

## 2020-04-14 LAB — C-REACTIVE PROTEIN: CRP: 6.1 mg/dL — ABNORMAL HIGH (ref <1.0)

## 2020-04-14 LAB — D-DIMER, QUANTITATIVE: D-Dimer, Quant: 2.22 ug/mL-FEU — ABNORMAL HIGH (ref 0.00–0.50)

## 2020-04-14 MED ORDER — PREDNISONE 10 MG PO TABS: ORAL_TABLET | ORAL | 0 refills | Status: AC

## 2020-04-14 MED ORDER — TIMOLOL MALEATE 0.5 % OP SOLN
1.0000 [drp] | Freq: Every day | OPHTHALMIC | Status: DC
  Filled 2020-04-14: qty 5

## 2020-04-14 MED ORDER — LATANOPROST 0.005 % OP SOLN
1.0000 [drp] | Freq: Every day | OPHTHALMIC | Status: DC
  Filled 2020-04-14: qty 2.5

## 2020-04-14 MED ORDER — ASPIRIN EC 81 MG PO TBEC: 81.0000 mg | DELAYED_RELEASE_TABLET | Freq: Every day | ORAL | 0 refills | Status: AC

## 2020-04-14 MED ORDER — ALBUTEROL SULFATE HFA 108 (90 BASE) MCG/ACT IN AERS
1.0000 | INHALATION_SPRAY | Freq: Four times a day (QID) | RESPIRATORY_TRACT | Status: DC | PRN
  Filled 2020-04-14: qty 6.7

## 2020-04-14 MED ORDER — LORATADINE 10 MG PO TABS
10.0000 mg | ORAL_TABLET | Freq: Every day | ORAL | Status: DC
  Administered 2020-04-14: 10 mg via ORAL
  Filled 2020-04-14: qty 1

## 2020-04-14 MED ORDER — TIMOLOL HEMIHYDRATE 0.5 % OP SOLN: 1.0000 [drp] | Freq: Every day | OPHTHALMIC | Status: DC

## 2020-04-14 MED ORDER — METOPROLOL SUCCINATE ER 25 MG PO TB24
25.0000 mg | ORAL_TABLET | Freq: Every day | ORAL | Status: DC
  Administered 2020-04-14: 25 mg via ORAL
  Filled 2020-04-14: qty 1

## 2020-04-14 NOTE — Progress Notes (Signed)
Physical Therapy Treatment Patient Details Name: Seth [name] MRN: [id number] DOB: May 14, 1941 Today's Date: 04/14/2020    History of Present Illness 79yo male who recently received the covid vaccine, now presenting with gross weakness. Found to be covid positive with pneumonia and mild hypoxemia. PMH prostate CA, HTN, B TKA    PT Comments    Patient continues to progress with mobility, much improved today.  Plan is to d/c home with family today.  Feel patient safe for d/c home with rolling walker and supervision to min assist for mobility.  Patient reports he has a rolling walker.      Follow Up Recommendations  Outpatient PT;Supervision/Assistance - 24 hour     Equipment Recommendations  3in1 (PT)    Recommendations for Other Services       Precautions / Restrictions Precautions Precautions: Fall;Other (comment) Precaution Comments: incontinent of urine, Covid +    Mobility  Bed Mobility Overal bed mobility: Modified Independent             General bed mobility comments: able to get to side of bed with use of rail  Transfers Overall transfer level: Needs assistance Equipment used: Rolling walker (2 wheeled) Transfers: Sit to/from Stand Sit to Stand: Min assist         General transfer comment: MinA to boost to full standing position and gain balance with RW  Ambulation/Gait Ambulation/Gait assistance: Supervision Gait Distance (Feet): 40 Feet Assistive device: Rolling walker (2 wheeled) Gait Pattern/deviations: Step-through pattern;Trunk flexed;Decreased stride length Gait velocity: decreased   General Gait Details: slow but steady with RW.  Attempted without RW, but needed increased assistance for balance.  Incontinue of urine in standing   Stairs             Wheelchair Mobility    Modified Rankin (Stroke Patients Only)       Balance Overall balance assessment: Needs assistance Sitting-balance support: Feet supported;No upper extremity  supported Sitting balance-Leahy Scale: Good Sitting balance - Comments: able to wipe legs in sitting without loss of balance   Standing balance support: Bilateral upper extremity supported;During functional activity Standing balance-Leahy Scale: Poor Standing balance comment: reliant on RW for balance                            Cognition Arousal/Alertness: Awake/alert Behavior During Therapy: WFL for tasks assessed/performed Overall Cognitive Status: No family/caregiver present to determine baseline cognitive functioning                                 General Comments: continued to have difficulty sequencing some tasks      Exercises      General Comments        Pertinent Vitals/Pain Pain Assessment: No/denies pain    Home Living                      Prior Function            PT Goals (current goals can now be found in the care plan section) Progress towards PT goals: Progressing toward goals    Frequency    Min 3X/week      PT Plan Current plan remains appropriate    Co-evaluation              AM-PAC PT "6 Clicks" Mobility   Outcome Measure  Help needed turning from  your back to your side while in a flat bed without using bedrails?: A Little Help needed moving from lying on your back to sitting on the side of a flat bed without using bedrails?: A Little Help needed moving to and from a bed to a chair (including a wheelchair)?: A Little Help needed standing up from a chair using your arms (e.g., wheelchair or bedside chair)?: A Little Help needed to walk in hospital room?: A Little Help needed climbing 3-5 steps with a railing? : A Little 6 Click Score: 18    End of Session   Activity Tolerance: Patient tolerated treatment well Patient left: in bed;with nursing/sitter in room (sitting EOB, RN in to give meds) Nurse Communication: Mobility status PT Visit Diagnosis: Unsteadiness on feet (R26.81);Difficulty in  walking, not elsewhere classified (R26.2);Muscle weakness (generalized) (M62.81)     Time: 1010-1035 PT Time Calculation (min) (ACUTE ONLY): 25 min  Charges:  $Gait Training: 23-37 mins                     04/14/2020 Margie, PT Acute Rehabilitation Services Pager:  [phone] Office:  [phone]     Shanna Cisco 04/14/2020, 10:43 AM

## 2020-04-14 NOTE — TOC Transition Note (Addendum)
Transition of Care [facility]) - CM/SW Discharge Note   Patient Details  Name: Seth [name] MRN: [id number] Date of Birth: 06-29-1941  Transition of Care [facility]) CM/SW Contact:  Sharin Mons, RN Phone Number: 04/14/2020, 10:41 AM   Clinical Narrative:    Patient will DC to: Home  Anticipated DC date: 04/14/2020 Family notified: Seth [name]( wife) Transport by: wife/son, car  Admitted with PNA, COVID positive. From home with wife. Hx of prostate CA, HTN, B TKA.  PCP: Luiz Ochoa   Per MD patient ready for DC today with home health PT to follow. Referral made with [facility] after providing choice. Pt was without preference.  Pt will go to [facility]  tomorrow and the day after tomorrow to complete Remdesivir infusion.  RN, patient, patient's family aware of d/c plan. Pt without Rx med concerns or affordability. DME 3IN1/BSC will be provided to pt prior to d/c from floor stock ( already has rolling walker @ home).  RNCM will sign off for now as intervention is no longer needed. Please consult [place] again if new needs arise.   [address][name]" T* (wife) Seth [name] [facility] [phone]      PCP: Luiz Ochoa     Final next level of care: Home w Home Health Services Barriers to Discharge: No Barriers Identified   Patient Goals and CMS Choice Patient states their goals for this hospitalization and ongoing recovery are:: return home   Choice offered to / list presented to : Patient, Spouse  Discharge Placement                       Discharge Plan and Services     Post Acute Care Choice: Home Health          DME Arranged: 3-N-1 DME Agency: [employer] (declined rolling walker , states already has one @ home) Date DME Agency Contacted: 04/14/20 Time DME Agency Contacted: [phone]   [place]: [place] Date [facility] Agency Contacted: 04/14/20 Time [place]: [place] Representative spoke with at [place]: [place] ([place]) Interventions     Readmission Risk Interventions No flowsheet data found.

## 2020-04-14 NOTE — Progress Notes (Signed)
Patient scheduled for outpatient Remdesivir infusions at 9am on Sunday 9/5 and Monay 9/6 at [facility]. Please inform the patient to park at [place], [place], as staff will be escorting the patient through the [place] entrance of the hospital.    There is a wave flag banner located near the entrance on [address]. Turn into this entrance and immediately turn left and park in 1 of the 5 designated Covid Infusion Parking spots. There is a phone number on the sign, please call and let the staff know what spot you are in and we will come out and get you. For questions call [phone].  Thanks.  * If patient is getting dropped off, you may have your ride pull up to the Main Entrance of [facility]. Please stay in the car and call [phone], staff will meet you at your car and escort you into the hospital and back to the clinic.

## 2020-04-14 NOTE — Discharge Summary (Signed)
PATIENT DETAILS Name: Seth [name] Age: 79 y.o. Sex: male Date of Birth: 10/29/1940 MRN: [id number]. Admitting Physician: Lequita Halt, MD POE:UMPNTIRW, Pamalee Leyden, PA-C  Admit Date: 04/12/2020 Discharge date: 04/14/2020  Recommendations for Outpatient Follow-up:  1. Follow up with PCP in 1-2 weeks 2. Please obtain CMP/CBC in one week 3. Repeat Chest Xray in 4-6 week   Admitted From:  Home  Disposition: [place]: No  Equipment/Devices: None  Discharge Condition: Stable  CODE STATUS: FULL CODE  Diet recommendation:  Diet Order            Diet - low sodium heart healthy           Diet Heart Room service appropriate? Yes; Fluid consistency: Thin  Diet effective now                 Brief Narrative: Patient is a 79 y.o. male with PMHx of HTN, HLD-recently-completed his second dose of COVID-19 vaccine (less than 14 weeks back per patient)-presenting with weakness-he was found to have COVID-19 infection with pneumonia and mild hypoxemia and subsequently admitted to the hospitalist service.  See below for further details.   COVID-19 vaccinated status: vaccinated  Significant Events: 9/2>> Admit to [facility] for mild hypoxia due to COVID-19 pneumonia  Significant studies: 9/2>>Chest x-ray: Vague area of opacity in the left hemidiaphragm-may represent developing infection. 9/3>> Echo: EF 43-15%, grade 1 diastolic dysfunction.   COVID-19 medications: Steroids: 9/2>> Remdesivir: 9/2>>  Antibiotics: None  Microbiology data: 9/2 >>blood culture: No growth  Procedures: None  Consults: None  Brief Hospital Course: Acute Hypoxic Resp Failure due to Covid 19 Viral pneumonia:  Had very mild disease-was quickly titrated to room air-has ambulated with physical therapy.  This morning he feels much better-he remains on room air-CRP is mildly elevated.  Plans are to continue tapering prednisone on discharge-he will complete 2 additional doses of  remdesivir in the infusion center at [facility] (patient confirms that his daughter will be able to take him for these appointments).  He will need to follow-up with his primary care practitioner in the next 1-2 weeks.    COVID-19 Labs:  Recent Labs    04/12/20 1340 04/13/20 0930 04/14/20 0500  DDIMER 2.01* 2.03* 2.22*  FERRITIN 98  --   --   LDH 429*  --   --   CRP 5.6* 6.4* 6.1*    Lab Results  Component Value Date   SARSCOV2NAA POSITIVE (A) 04/12/2020     AKI: Very mild-likely hemodynamically mediated-continue to monitor very closely.  PCP to recheck electrolytes in 1 week  Transaminitis: Mild-unlikely secondary to COVID-19-follow.  PCP recheck LFTs in 1 week  HLD: Home med list does not list statins-PCP to follow-up and start statins accordingly.  Watch LFTs.  HTN: BP controlled-not on any antihypertensives-await medication reconciliation by pharmacy.  Minimally elevated troponin: Does not have chest pain-trend is flat-very minimally elevated-doubt of any clinical significance.  EKG nonacute.  Doubt any further work-up required.  Weakness/debility/deconditioning: Secondary to COVID-19-Per spouse-patient has had issues with weakness/ambulation-and has been using a cane/walker for the past several months.  Reviewed physical therapy note-does not appear he is that different from his baseline.  Spouse/family will provide 24/7 care.  We will go ahead and order home health services.   Note-spouse updated over plans for discharge-and for remdesivir infusion at [facility] for 2 additional days.   Discharge Diagnoses:  Principal Problem:   Pneumonia due to  COVID-19 virus Active Problems:   AKI (acute kidney injury) ([facility])   Sepsis ([place])   Acute hypoxemic respiratory failure (HCC)   Elevated troponin   Discharge Instructions:    Person Under Monitoring Name: Seth [name]  Location: [place] [address]   Infection  Prevention Recommendations for Individuals Confirmed to have, or Being Evaluated for, 2019 Novel Coronavirus (COVID-19) Infection Who Receive Care at Home  Individuals who are confirmed to have, or are being evaluated for, COVID-19 should follow the prevention steps below until a healthcare provider or local or state health department says they can return to normal activities.  Stay home except to get medical care You should restrict activities outside your home, except for getting medical care. Do not go to work, school, or public areas, and do not use public transportation or taxis.  Call ahead before visiting your doctor Before your medical appointment, call the healthcare provider and tell them that you have, or are being evaluated for, COVID-19 infection. This will help the healthcare provider's office take steps to keep other people from getting infected. Ask your healthcare provider to call the local or state health department.  Monitor your symptoms Seek prompt medical attention if your illness is worsening (e.g., difficulty breathing). Before going to your medical appointment, call the healthcare provider and tell them that you have, or are being evaluated for, COVID-19 infection. Ask your healthcare provider to call the local or state health department.  Wear a facemask You should wear a facemask that covers your nose and mouth when you are in the same room with other people and when you visit a healthcare provider. People who live with or visit you should also wear a facemask while they are in the same room with you.  Separate yourself from other people in your home As much as possible, you should stay in a different room from other people in your home. Also, you should use a separate bathroom, if available.  Avoid sharing household items You should not share dishes, drinking glasses, cups, eating utensils, towels, bedding, or other items with other people in your home.  After using these items, you should wash them thoroughly with soap and water.  Cover your coughs and sneezes Cover your mouth and nose with a tissue when you cough or sneeze, or you can cough or sneeze into your sleeve. Throw used tissues in a lined trash can, and immediately wash your hands with soap and water for at least 20 seconds or use an alcohol-based hand rub.  Wash your [facility] your hands often and thoroughly with soap and water for at least 20 seconds. You can use an alcohol-based hand sanitizer if soap and water are not available and if your hands are not visibly dirty. Avoid touching your eyes, nose, and mouth with unwashed hands.   Prevention Steps for Caregivers and Household Members of Individuals Confirmed to have, or Being Evaluated for, COVID-19 Infection Being Cared for in the Home  If you live with, or provide care at home for, a person confirmed to have, or being evaluated for, COVID-19 infection please follow these guidelines to prevent infection:  Follow healthcare provider's instructions Make sure that you understand and can help the patient follow any healthcare provider instructions for all care.  Provide for the patient's basic needs You should help the patient with basic needs in the home and provide support for getting groceries, prescriptions, and other personal needs.  Monitor the  patient's symptoms If they are getting sicker, call his or her medical provider and tell them that the patient has, or is being evaluated for, COVID-19 infection. This will help the healthcare provider's office take steps to keep other people from getting infected. Ask the healthcare provider to call the local or state health department.  Limit the number of people who have contact with the patient  If possible, have only one caregiver for the patient.  Other household members should stay in another home or place of residence. If this is not possible, they should  stay  in another room, or be separated from the patient as much as possible. Use a separate bathroom, if available.  Restrict visitors who do not have an essential need to be in the home.  Keep older adults, very young children, and other sick people away from the patient Keep older adults, very young children, and those who have compromised immune systems or chronic health conditions away from the patient. This includes people with chronic heart, lung, or kidney conditions, diabetes, and cancer.  Ensure good ventilation Make sure that shared spaces in the home have good air flow, such as from an air conditioner or an opened window, weather permitting.  Wash your hands often  Wash your hands often and thoroughly with soap and water for at least 20 seconds. You can use an alcohol based hand sanitizer if soap and water are not available and if your hands are not visibly dirty.  Avoid touching your eyes, nose, and mouth with unwashed hands.  Use disposable paper towels to dry your hands. If not available, use dedicated cloth towels and replace them when they become wet.  Wear a facemask and gloves  Wear a disposable facemask at all times in the room and gloves when you touch or have contact with the patient's blood, body fluids, and/or secretions or excretions, such as sweat, saliva, sputum, nasal mucus, vomit, urine, or feces.  Ensure the mask fits over your nose and mouth tightly, and do not touch it during use.  Throw out disposable facemasks and gloves after using them. Do not reuse.  Wash your hands immediately after removing your facemask and gloves.  If your personal clothing becomes contaminated, carefully remove clothing and launder. Wash your hands after handling contaminated clothing.  Place all used disposable facemasks, gloves, and other waste in a lined container before disposing them with other household waste.  Remove gloves and wash your hands immediately after handling  these items.  Do not share dishes, glasses, or other household items with the patient  Avoid sharing household items. You should not share dishes, drinking glasses, cups, eating utensils, towels, bedding, or other items with a patient who is confirmed to have, or being evaluated for, COVID-19 infection.  After the person uses these items, you should wash them thoroughly with soap and water.  Wash laundry thoroughly  Immediately remove and wash clothes or bedding that have blood, body fluids, and/or secretions or excretions, such as sweat, saliva, sputum, nasal mucus, vomit, urine, or feces, on them.  Wear gloves when handling laundry from the patient.  Read and follow directions on labels of laundry or clothing items and detergent. In general, wash and dry with the warmest temperatures recommended on the label.  Clean all areas the individual has used often  Clean all touchable surfaces, such as counters, tabletops, doorknobs, bathroom fixtures, toilets, phones, keyboards, tablets, and bedside tables, every day. Also, clean any surfaces that may have  blood, body fluids, and/or secretions or excretions on them.  Wear gloves when cleaning surfaces the patient has come in contact with.  Use a diluted bleach solution (e.g., dilute bleach with 1 part bleach and 10 parts water) or a household disinfectant with a label that says EPA-registered for coronaviruses. To make a bleach solution at home, add 1 tablespoon of bleach to 1 quart (4 cups) of water. For a larger supply, add  cup of bleach to 1 gallon (16 cups) of water.  Read labels of cleaning products and follow recommendations provided on product labels. Labels contain instructions for safe and effective use of the cleaning product including precautions you should take when applying the product, such as wearing gloves or eye protection and making sure you have good ventilation during use of the product.  Remove gloves and wash hands  immediately after cleaning.  Monitor yourself for signs and symptoms of illness Caregivers and household members are considered close contacts, should monitor their health, and will be asked to limit movement outside of the home to the extent possible. Follow the monitoring steps for close contacts listed on the symptom monitoring form.   ? If you have additional questions, contact your local health department or call the epidemiologist on call at [phone] (available 24/7). ? This guidance is subject to change. For the most up-to-date guidance from CDC, please refer to their website: YouBlogs.pl    Activity:  As tolerated with Full fall precautions use walker/cane & assistance as needed  Discharge Instructions    Call MD for:  difficulty breathing, headache or visual disturbances   Complete by: As directed    Call MD for:  persistant nausea and vomiting   Complete by: As directed    Diet - low sodium heart healthy   Complete by: As directed    Discharge instructions   Complete by: As directed    Follow with Primary care practitioner Drosinis, Pamalee Leyden, PA-C in 1-2 weeks  21 days of isolation from 04/12/2020  Please ask your primary care practitioner to repeat two-view chest x-ray in 4 to 6 weeks, to repeat electrolytes and LFTs in 1 week.  You had mild kidney injury and mild elevation in your liver enzymes-and these will be need to be rechecked at your next follow-up with your PCP.  You are scheduled for outpatient Remdesivir infusions at 9am on Sunday 9/5 and Monay 9/6  at [facility]. Please inform the patient to park at [place], [place], as staff will be escorting the patient through the [place] entrance of the hospital.    There is a wave flag banner located near the entrance on [address]. Turn into this entrance and immediately turn left and park in 1 of the 5 designated Covid Infusion Parking  spots. There is a phone number on the sign, please call and let the staff know what spot you are in and we will come out and get you. For questions call [phone].  Thanks.  * If you are getting dropped off, you may have your ride pull up to the Main Entrance of [facility]. Please stay in the car and call [phone], staff will meet you at your car and escort you into the hospital and back to the clinic.    Please get a complete blood count and chemistry panel checked by your Primary MD at your next visit, and again as instructed by your Primary MD.  Get Medicines reviewed and adjusted:  Please take all your medications with you for your next visit with your Primary MD  Laboratory/radiological data: Please request your Primary MD to go over all hospital tests and procedure/radiological results at the follow up, please ask your Primary MD to get all Hospital records sent to his/her office.  In some cases, they will be blood work, cultures and biopsy results pending at the time of your discharge. Please request that your primary care M.D. follows up on these results.  Also Note the following: If you experience worsening of your admission symptoms, develop shortness of breath, life threatening emergency, suicidal or homicidal thoughts you must seek medical attention immediately by calling 911 or calling your MD immediately  if symptoms less severe.  You must read complete instructions/literature along with all the possible adverse reactions/side effects for all the Medicines you take and that have been prescribed to you. Take any new Medicines after you have completely understood and accpet all the possible adverse reactions/side effects.   Do not drive when taking Pain medications or sleeping medications (Benzodaizepines)  Do not take more than prescribed Pain, Sleep and Anxiety Medications. It is not advisable to combine anxiety,sleep and pain medications without talking with your  primary care practitioner  Special Instructions: If you have smoked or chewed Tobacco  in the last 2 yrs please stop smoking, stop any regular Alcohol  and or any Recreational drug use.  Wear Seat belts while driving.  Please note: You were cared for by a hospitalist during your hospital stay. Once you are discharged, your primary care physician will handle any further medical issues. Please note that NO REFILLS for any discharge medications will be authorized once you are discharged, as it is imperative that you return to your primary care physician (or establish a relationship with a primary care physician if you do not have one) for your post hospital discharge needs so that they can reassess your need for medications and monitor your lab values.   Increase activity slowly   Complete by: As directed      Allergies as of 04/14/2020   No Known Allergies     Medication List    STOP taking these medications   rivaroxaban 10 MG Tabs tablet Commonly known as: XARELTO     TAKE these medications   albuterol 108 (90 Base) MCG/ACT inhaler Commonly known as: VENTOLIN HFA Inhale 1-2 puffs into the lungs every 6 (six) hours as needed for wheezing or shortness of breath.   aspirin EC 81 MG tablet Take 1 tablet (81 mg total) by mouth daily. Swallow whole.   loratadine 10 MG tablet Commonly known as: CLARITIN Take 10 mg by mouth daily.   METOPROLOL SUCCINATE ER PO Take 25 mg by mouth daily.   predniSONE 10 MG tablet Commonly known as: DELTASONE Take 40 mg daily for 1 day, 30 mg daily for 1 day, 20 mg daily for 1 days,10 mg daily for 1 day, then stop   Simbrinza 1-0.2 % Susp Generic drug: Brinzolamide-Brimonidine Place 1 drop into both eyes 2 (two) times daily.   timolol 0.5 % ophthalmic solution Commonly known as: BETIMOL Place 1 drop into both eyes daily.   Travoprost (BAK Free) 0.004 % Soln ophthalmic solution Commonly known as: TRAVATAN Place 1 drop into both eyes at  bedtime.            Durable Medical Equipment  (From admission, onward)         Start     Ordered  04/14/20 1004  For home use only DME 3 n 1  Once        04/14/20 1003          Follow-up Information    Drosinis, Pamalee Leyden, PA-C. Schedule an appointment as soon as possible for a visit in 1 week(s).   Specialty: Internal Medicine Contact information: [place] [address] [phone]              No Known Allergies  Other Procedures/Studies: DG Chest Portable 1 View  Result Date: 04/12/2020 CLINICAL DATA:  Dizziness, UTI EXAM: PORTABLE CHEST 1 VIEW COMPARISON:  August 30, 2018 FINDINGS: Trachea midline. Lung volumes are diminished with persistent elevation of the LEFT hemidiaphragm and juxta diaphragmatic atelectasis. Slightly increased airspace opacity over the LEFT hemidiaphragm aside from expected changes of atelectasis. No lobar consolidation. No pleural effusion. On limited assessment skeletal structures without acute process. IMPRESSION: 1. Persistent elevation of the LEFT hemidiaphragm and juxta diaphragmatic atelectasis. 2. Area of vague opacity over lying the LEFT hemidiaphragm may reflect more pronounced atelectatic change, developing infection in this area is not excluded Electronically Signed   By: Zetta Bills M.D.   On: 04/12/2020 11:49   ECHOCARDIOGRAM COMPLETE  Result Date: 04/13/2020    ECHOCARDIOGRAM REPORT   Patient Name:   Seth [name] Date of Exam: 04/13/2020 Medical Rec #:  [id number]      Height:       71.0 in Accession #:    [id number]     Weight:       153.0 lb Date of Birth:  1941-03-16     BSA:          1.882 m Patient Age:    76 years       BP:           143/96 mmHg Patient Gender: M              HR:           87 bpm. Exam Location:  Inpatient Procedure: 2D Echo, Cardiac Doppler, Color Doppler and Intracardiac            Opacification Agent Indications:    Elevated Troponin  History:        Patient has prior history of  Echocardiogram examinations, most                 recent 08/31/2018. Signs/Symptoms:Syncope; Risk                 Factors:Hypertension.  Sonographer:    Bernadene Person RDCS Referring Phys: [id number] [place]  1. Left ventricular ejection fraction, by estimation, is 55 to 60%. The left ventricle has normal function. The left ventricle has no regional wall motion abnormalities. Left ventricular diastolic parameters are consistent with Grade I diastolic dysfunction (impaired relaxation). Elevated left atrial pressure.  2. Right ventricular systolic function is normal. The right ventricular size is normal. There is normal pulmonary artery systolic pressure.  3. The mitral valve is normal in structure. No evidence of mitral valve regurgitation. No evidence of mitral stenosis.  4. The aortic valve is normal in structure. Aortic valve regurgitation is trivial. No aortic stenosis is present.  5. The inferior vena cava is normal in size with greater than 50% respiratory variability, suggesting right atrial pressure of 3 mmHg. Comparison(s): Prior images unable to be directly viewed, comparison made by report only. Technically difficult images. There is intermittent pseudodyskinesis of the diaphragmatic wall (increased  infradiaphragmatic pressure), but no true wall motion abnormalities appear to be present. FINDINGS  Left Ventricle: Left ventricular ejection fraction, by estimation, is 55 to 60%. The left ventricle has normal function. The left ventricle has no regional wall motion abnormalities. Definity contrast agent was given IV to delineate the left ventricular  endocardial borders. The left ventricular internal cavity size was normal in size. There is no left ventricular hypertrophy. Left ventricular diastolic parameters are consistent with Grade I diastolic dysfunction (impaired relaxation). Elevated left atrial pressure. Right Ventricle: The right ventricular size is normal. No increase in right  ventricular wall thickness. Right ventricular systolic function is normal. There is normal pulmonary artery systolic pressure. The tricuspid regurgitant velocity is 2.61 m/s, and  with an assumed right atrial pressure of 3 mmHg, the estimated right ventricular systolic pressure is 62.6 mmHg. Left Atrium: Left atrial size was normal in size. Right Atrium: Right atrial size was normal in size. Pericardium: There is no evidence of pericardial effusion. Mitral Valve: The mitral valve is normal in structure. Normal mobility of the mitral valve leaflets. No evidence of mitral valve regurgitation. No evidence of mitral valve stenosis. Tricuspid Valve: The tricuspid valve is normal in structure. Tricuspid valve regurgitation is trivial. No evidence of tricuspid stenosis. Aortic Valve: The aortic valve is normal in structure. Aortic valve regurgitation is trivial. No aortic stenosis is present. Pulmonic Valve: The pulmonic valve was normal in structure. Pulmonic valve regurgitation is not visualized. No evidence of pulmonic stenosis. Aorta: The aortic root is normal in size and structure. Venous: The inferior vena cava is normal in size with greater than 50% respiratory variability, suggesting right atrial pressure of 3 mmHg. IAS/Shunts: No atrial level shunt detected by color flow Doppler.  LEFT VENTRICLE PLAX 2D LVOT diam:     2.00 cm  Diastology LV SV:         53       LV e' lateral:   6.96 cm/s LV SV Index:   28       LV E/e' lateral: 5.0 LVOT Area:     3.14 cm LV e' medial:    5.44 cm/s                         LV E/e' medial:  6.4  RIGHT VENTRICLE RV S prime:     9.90 cm/s TAPSE (M-mode): 1.5 cm LEFT ATRIUM             Index       RIGHT ATRIUM           Index LA Vol (A2C):   78.5 ml 41.72 ml/m RA Area:     13.90 cm LA Vol (A4C):   63.5 ml 33.75 ml/m RA Volume:   26.90 ml  14.30 ml/m LA Biplane Vol: 74.0 ml 39.33 ml/m  AORTIC VALVE LVOT Vmax:   86.80 cm/s LVOT Vmean:  57.500 cm/s LVOT VTI:    0.170 m  AORTA Ao Root  diam: 3.60 cm Ao Asc diam:  4.20 cm MITRAL VALVE                TRICUSPID VALVE MV Area (PHT): 1.79 cm     TR Peak grad:   27.2 mmHg MV Decel Time: 423 msec     TR Vmax:        261.00 cm/s MV E velocity: 34.70 cm/s MV A velocity: 104.00 cm/s  SHUNTS MV E/A ratio:  0.33  Systemic VTI:  0.17 m                             Systemic Diam: 2.00 cm Dani Gobble Croitoru MD Electronically signed by Sanda Klein MD Signature Date/Time: 04/13/2020/4:38:02 PM    Final      TODAY-DAY OF DISCHARGE:  Subjective:   Seth [name] today has no headache,no chest abdominal pain,no new weakness tingling or numbness, feels much better wants to go home today.   Objective:   Blood pressure 139/88, pulse 78, temperature 98.1 F (36.7 C), temperature source Oral, resp. rate 20, height 5\' 11"  (1.803 m), weight 69.4 kg, SpO2 92 %. No intake or output data in the 24 hours ending 04/14/20 1009 Filed Weights   04/12/20 1124  Weight: 69.4 kg    Exam: Awake Alert, Oriented *3, No new F.N deficits, Normal affect [place] [place]AT,PERRAL Supple Neck,No JVD, No cervical lymphadenopathy appriciated.  Symmetrical Chest wall movement, Good air movement bilaterally, CTAB RRR,No Gallops,Rubs or new Murmurs, No Parasternal Heave +ve B.Sounds, Abd Soft, Non tender, No organomegaly appriciated, No rebound -guarding or rigidity. No Cyanosis, Clubbing or edema, No new Rash or bruise   PERTINENT RADIOLOGIC STUDIES: DG Chest Portable 1 View  Result Date: 04/12/2020 CLINICAL DATA:  Dizziness, UTI EXAM: PORTABLE CHEST 1 VIEW COMPARISON:  August 30, 2018 FINDINGS: Trachea midline. Lung volumes are diminished with persistent elevation of the LEFT hemidiaphragm and juxta diaphragmatic atelectasis. Slightly increased airspace opacity over the LEFT hemidiaphragm aside from expected changes of atelectasis. No lobar consolidation. No pleural effusion. On limited assessment skeletal structures without acute process. IMPRESSION: 1. Persistent elevation  of the LEFT hemidiaphragm and juxta diaphragmatic atelectasis. 2. Area of vague opacity over lying the LEFT hemidiaphragm may reflect more pronounced atelectatic change, developing infection in this area is not excluded Electronically Signed   By: Zetta Bills M.D.   On: 04/12/2020 11:49   ECHOCARDIOGRAM COMPLETE  Result Date: 04/13/2020    ECHOCARDIOGRAM REPORT   Patient Name:   Seth [name] Date of Exam: 04/13/2020 Medical Rec #:  [id number]      Height:       71.0 in Accession #:    [id number]     Weight:       153.0 lb Date of Birth:  25-Dec-1940     BSA:          1.882 m Patient Age:    64 years       BP:           143/96 mmHg Patient Gender: M              HR:           87 bpm. Exam Location:  Inpatient Procedure: 2D Echo, Cardiac Doppler, Color Doppler and Intracardiac            Opacification Agent Indications:    Elevated Troponin  History:        Patient has prior history of Echocardiogram examinations, most                 recent 08/31/2018. Signs/Symptoms:Syncope; Risk                 Factors:Hypertension.  Sonographer:    Bernadene Person RDCS Referring Phys: [id number] [place]  1. Left ventricular ejection fraction, by estimation, is 55 to 60%. The left ventricle has normal function. The left ventricle has no regional wall motion abnormalities. Left  ventricular diastolic parameters are consistent with Grade I diastolic dysfunction (impaired relaxation). Elevated left atrial pressure.  2. Right ventricular systolic function is normal. The right ventricular size is normal. There is normal pulmonary artery systolic pressure.  3. The mitral valve is normal in structure. No evidence of mitral valve regurgitation. No evidence of mitral stenosis.  4. The aortic valve is normal in structure. Aortic valve regurgitation is trivial. No aortic stenosis is present.  5. The inferior vena cava is normal in size with greater than 50% respiratory variability, suggesting right atrial pressure of 3  mmHg. Comparison(s): Prior images unable to be directly viewed, comparison made by report only. Technically difficult images. There is intermittent pseudodyskinesis of the diaphragmatic wall (increased infradiaphragmatic pressure), but no true wall motion abnormalities appear to be present. FINDINGS  Left Ventricle: Left ventricular ejection fraction, by estimation, is 55 to 60%. The left ventricle has normal function. The left ventricle has no regional wall motion abnormalities. Definity contrast agent was given IV to delineate the left ventricular  endocardial borders. The left ventricular internal cavity size was normal in size. There is no left ventricular hypertrophy. Left ventricular diastolic parameters are consistent with Grade I diastolic dysfunction (impaired relaxation). Elevated left atrial pressure. Right Ventricle: The right ventricular size is normal. No increase in right ventricular wall thickness. Right ventricular systolic function is normal. There is normal pulmonary artery systolic pressure. The tricuspid regurgitant velocity is 2.61 m/s, and  with an assumed right atrial pressure of 3 mmHg, the estimated right ventricular systolic pressure is 34.1 mmHg. Left Atrium: Left atrial size was normal in size. Right Atrium: Right atrial size was normal in size. Pericardium: There is no evidence of pericardial effusion. Mitral Valve: The mitral valve is normal in structure. Normal mobility of the mitral valve leaflets. No evidence of mitral valve regurgitation. No evidence of mitral valve stenosis. Tricuspid Valve: The tricuspid valve is normal in structure. Tricuspid valve regurgitation is trivial. No evidence of tricuspid stenosis. Aortic Valve: The aortic valve is normal in structure. Aortic valve regurgitation is trivial. No aortic stenosis is present. Pulmonic Valve: The pulmonic valve was normal in structure. Pulmonic valve regurgitation is not visualized. No evidence of pulmonic stenosis. Aorta:  The aortic root is normal in size and structure. Venous: The inferior vena cava is normal in size with greater than 50% respiratory variability, suggesting right atrial pressure of 3 mmHg. IAS/Shunts: No atrial level shunt detected by color flow Doppler.  LEFT VENTRICLE PLAX 2D LVOT diam:     2.00 cm  Diastology LV SV:         53       LV e' lateral:   6.96 cm/s LV SV Index:   28       LV E/e' lateral: 5.0 LVOT Area:     3.14 cm LV e' medial:    5.44 cm/s                         LV E/e' medial:  6.4  RIGHT VENTRICLE RV S prime:     9.90 cm/s TAPSE (M-mode): 1.5 cm LEFT ATRIUM             Index       RIGHT ATRIUM           Index LA Vol (A2C):   78.5 ml 41.72 ml/m RA Area:     13.90 cm LA Vol (A4C):   63.5 ml 33.75 ml/m RA  Volume:   26.90 ml  14.30 ml/m LA Biplane Vol: 74.0 ml 39.33 ml/m  AORTIC VALVE LVOT Vmax:   86.80 cm/s LVOT Vmean:  57.500 cm/s LVOT VTI:    0.170 m  AORTA Ao Root diam: 3.60 cm Ao Asc diam:  4.20 cm MITRAL VALVE                TRICUSPID VALVE MV Area (PHT): 1.79 cm     TR Peak grad:   27.2 mmHg MV Decel Time: 423 msec     TR Vmax:        261.00 cm/s MV E velocity: 34.70 cm/s MV A velocity: 104.00 cm/s  SHUNTS MV E/A ratio:  0.33         Systemic VTI:  0.17 m                             Systemic Diam: 2.00 cm Dani Gobble Croitoru MD Electronically signed by Sanda Klein MD Signature Date/Time: 04/13/2020/4:38:02 PM    Final      PERTINENT LAB RESULTS: CBC: Recent Labs    04/13/20 0930 04/14/20 0500  WBC 5.0 3.6*  HGB 12.9* 13.0  HCT 42.9 41.8  PLT 214 197   CMET CMP     Component Value Date/Time   NA 136 04/14/2020 0500   K 4.4 04/14/2020 0500   CL 105 04/14/2020 0500   CO2 21 (L) 04/14/2020 0500   GLUCOSE 121 (H) 04/14/2020 0500   BUN 18 04/14/2020 0500   CREATININE 1.31 (H) 04/14/2020 0500   CALCIUM 8.5 (L) 04/14/2020 0500   PROT 6.5 04/14/2020 0500   ALBUMIN 2.8 (L) 04/14/2020 0500   AST 158 (H) 04/14/2020 0500   ALT 76 (H) 04/14/2020 0500   ALKPHOS 89 04/14/2020  0500   BILITOT 0.7 04/14/2020 0500   GFRNONAA 52 (L) 04/14/2020 0500   GFRAA >60 04/14/2020 0500    GFR Estimated Creatinine Clearance: 46.4 mL/min (A) (by C-G formula based on SCr of 1.31 mg/dL (H)). No results for input(s): LIPASE, AMYLASE in the last 72 hours. No results for input(s): CKTOTAL, CKMB, CKMBINDEX, TROPONINI in the last 72 hours. Invalid input(s): POCBNP Recent Labs    04/13/20 0930 04/14/20 0500  DDIMER 2.03* 2.22*   No results for input(s): HGBA1C in the last 72 hours. Recent Labs    04/12/20 1340  TRIG 109   No results for input(s): TSH, T4TOTAL, T3FREE, THYROIDAB in the last 72 hours.  Invalid input(s): FREET3 Recent Labs    04/12/20 1340  FERRITIN 98   Coags: No results for input(s): INR in the last 72 hours.  Invalid input(s): PT Microbiology: Recent Results (from the past 240 hour(s))  SARS Coronavirus 2 by RT PCR (hospital order, performed in [facility] hospital lab) Nasopharyngeal Nasopharyngeal Swab     Status: Abnormal   Collection Time: 04/12/20 11:37 AM   Specimen: Nasopharyngeal Swab  Result Value Ref Range Status   SARS Coronavirus 2 POSITIVE (A) NEGATIVE Final    Comment: RESULT CALLED TO, READ BACK BY AND VERIFIED WITH: MARVA SIMMS RN @1235  04/12/2020 [facility]ON[facility] (NOTE) SARS-CoV-2 target nucleic acids are DETECTED  SARS-CoV-2 RNA is generally detectable in upper respiratory specimens  during the acute phase of infection.  Positive results are indicative  of the presence of the identified virus, but do not rule out bacterial infection or co-infection with other pathogens not detected by the test.  Clinical correlation with patient history and  other diagnostic information is necessary to determine patient infection status.  The expected result is negative.  Fact Sheet for Patients:   StrictlyIdeas.no   Fact Sheet for Healthcare Providers:   BankingDealers.co.za    This test is not yet  approved or cleared by the [place] FDA and  has been authorized for detection and/or diagnosis of SARS-CoV-2 by FDA under an Emergency Use Authorization (EUA).  This EUA will remain in effect (meaning thi s test can be used) for the duration of  the COVID-19 declaration under Section 564(b)(1) of the Act, 21 U.S.C. section 360-bbb-3(b)(1), unless the authorization is terminated or revoked sooner.  Performed at [facility], [address] [place], [place] [address]   Blood Culture (routine x 2)     Status: None (Preliminary result)   Collection Time: 04/12/20  1:41 PM   Specimen: BLOOD  Result Value Ref Range Status   Specimen Description   Final    BLOOD RIGHT UPPER ARM Performed at [facility], [address] [place], [place] [address]    Special Requests   Final    BOTTLES DRAWN AEROBIC AND ANAEROBIC Blood Culture adequate volume Performed at [facility] [place] [address], [address] [place], [place] [address]    Culture   Final    NO GROWTH < 12 HOURS Performed at [facility], [place] [address] [place], [place] [address]    Report Status PENDING  Incomplete    FURTHER DISCHARGE INSTRUCTIONS:  Get Medicines reviewed and adjusted: Please take all your medications with you for your next visit with your Primary MD  Laboratory/radiological data: Please request your Primary MD to go over all hospital tests and procedure/radiological results at the follow up, please ask your Primary MD to get all Hospital records sent to his/her office.  In some cases, they will be blood work, cultures and biopsy results pending at the time of your discharge. Please request that your primary care M.D. goes through all the records of your hospital data and follows up on these results.  Also Note the following: If you experience worsening of your admission symptoms, develop shortness of breath, life threatening emergency, suicidal or homicidal thoughts you must  seek medical attention immediately by calling 911 or calling your MD immediately  if symptoms less severe.  You must read complete instructions/literature along with all the possible adverse reactions/side effects for all the Medicines you take and that have been prescribed to you. Take any new Medicines after you have completely understood and accpet all the possible adverse reactions/side effects.   Do not drive when taking Pain medications or sleeping medications (Benzodaizepines)  Do not take more than prescribed Pain, Sleep and Anxiety Medications. It is not advisable to combine anxiety,sleep and pain medications without talking with your primary care practitioner  Special Instructions: If you have smoked or chewed Tobacco  in the last 2 yrs please stop smoking, stop any regular Alcohol  and or any Recreational drug use.  Wear Seat belts while driving.  Please note: You were cared for by a hospitalist during your hospital stay. Once you are discharged, your primary care physician will handle any further medical issues. Please note that NO REFILLS for any discharge medications will be authorized once you are discharged, as it is imperative that you return to your primary care physician (or establish a relationship with a primary care physician if you do not have one) for your post  hospital discharge needs so that they can reassess your need for medications and monitor your lab values.  Total Time spent coordinating discharge including counseling, education and face to face time equals 35 minutes.  SignedOren Binet 04/14/2020 10:09 AM

## 2020-04-14 NOTE — TOC Initial Note (Signed)
Transition of Care [facility]) - Initial/Assessment Note    Patient Details  Name: Seth [name] MRN: [id number] Date of Birth: 1941-02-05  [facility] CM/SW Contact:    Joanne Chars, LCSW Phone Number: 04/14/2020, 10:30 AM  Clinical Narrative:    CSW completed remote assessment.  Pt lives at home with wife.  Discussed recommendation for [facility] services and pt would like to pursue this plan. No previous [facility] services. Permission received to speak with wife, who confirmed that she is home with pt full time and can provide support.  Wife reports pt currently has walker, cane, and bedside commode in the home.  Pt is vaccinated for covid.  Wife expecting pt at home today and pt son will be available for transport.                Expected Discharge Plan: [place] Barriers to Discharge: No Barriers Identified   Patient Goals and CMS Choice Patient states their goals for this hospitalization and ongoing recovery are:: return home      Expected Discharge Plan and Services Expected Discharge Plan: [place] Acute Care Choice: [place] arrangements for the past 2 months: Single Family Home Expected Discharge Date: 04/14/20                                    Prior Living Arrangements/Services Living arrangements for the past 2 months: Single Family Home Lives with:: Spouse Patient language and need for interpreter reviewed:: Yes Do you feel safe going back to the place where you live?: Yes      Need for Family Participation in Patient Care: No (Comment) Care giver support system in place?: Yes (comment) Current home services: Home OT, Home PT Criminal Activity/Legal Involvement Pertinent to Current Situation/Hospitalization: No - Comment as needed  Activities of Daily Living Home Assistive Devices/Equipment: None ADL Screening (condition at time of admission) Patient's cognitive ability adequate to safely  complete daily activities?: Yes Is the patient deaf or have difficulty hearing?: No Does the patient have difficulty seeing, even when wearing glasses/contacts?: No Does the patient have difficulty concentrating, remembering, or making decisions?: No Patient able to express need for assistance with ADLs?: Yes Does the patient have difficulty dressing or bathing?: No Independently performs ADLs?: Yes (appropriate for developmental age) Does the patient have difficulty walking or climbing stairs?: No Weakness of Legs: Both Weakness of Arms/Hands: None  Permission Sought/Granted Permission sought to share information with : Family Supports, Other (comment) (HH) Permission granted to share information with : Yes, Verbal Permission Granted  Share Information with NAME: Merrell [place], wife  Permission granted to share info w AGENCY: [facility]        Emotional Assessment Appearance:: Other (Comment Required (unable to assess-phone assessment)   Affect (typically observed): Pleasant Orientation: : Oriented to Self, Oriented to Place, Oriented to  Time, Oriented to Situation Alcohol / Substance Use: Not Applicable Psych Involvement: No (comment)  Admission diagnosis:  Elevated troponin [R77.8] Acute kidney injury ([place]) [N17.9] COVID-19 virus infection [U07.1] COVID-19 [U07.1] Patient Active Problem List   Diagnosis Date Noted  . Sepsis ([place]) 04/13/2020  . Pneumonia due to COVID-19 virus 04/13/2020  . Acute hypoxemic respiratory failure ([place]) 04/13/2020  . Elevated troponin 04/13/2020  . Syncope 08/30/2018  . Essential hypertension 08/30/2018  . AKI (acute kidney injury) ([place]) 08/30/2018  .  Hyponatremia 08/30/2018  . Volume depletion 08/30/2018  . OA (osteoarthritis) of knee 09/10/2015   PCP:  Drosinis, Pamalee Leyden, PA-C Pharmacy:   [id number] - [place], [place] [address] [place] [address] Phone:  [phone] Fax: [phone]     Social Determinants of Health (SDOH) Interventions    Readmission Risk Interventions No flowsheet data found.

## 2020-04-14 NOTE — Discharge Instructions (Addendum)
Patient scheduled for outpatient Remdesivir infusions at 9am on Sunday 9/5 and Monay 9/6  at [facility]. Please inform the patient to park at [place], [place], as staff will be escorting the patient through the [place] entrance of the hospital.    There is a wave flag banner located near the entrance on [address]. Turn into this entrance and immediately turn left and park in 1 of the 5 designated Covid Infusion Parking spots. There is a phone number on the sign, please call and let the staff know what spot you are in and we will come out and get you. For questions call [phone].  Thanks.  * If patient is getting dropped off, you may have your ride pull up to the Main Entrance of [facility]. Please stay in the car and call [phone], staff will meet you at your car and escort you into the hospital and back to the clinic.        Person Under Monitoring Name: Seth [name]  Location: [place] [address]   Infection Prevention Recommendations for Individuals Confirmed to have, or Being Evaluated for, 2019 Novel Coronavirus (COVID-19) Infection Who Receive Care at Home  Individuals who are confirmed to have, or are being evaluated for, COVID-19 should follow the prevention steps below until a healthcare provider or local or state health department says they can return to normal activities.  Stay home except to get medical care You should restrict activities outside your home, except for getting medical care. Do not go to work, school, or public areas, and do not use public transportation or taxis.  Call ahead before visiting your doctor Before your medical appointment, call the healthcare provider and tell them that you have, or are being evaluated for, COVID-19 infection. This will help the healthcare provider's office take steps to keep other people from getting infected. Ask your healthcare provider to call the local or state  health department.  Monitor your symptoms Seek prompt medical attention if your illness is worsening (e.g., difficulty breathing). Before going to your medical appointment, call the healthcare provider and tell them that you have, or are being evaluated for, COVID-19 infection. Ask your healthcare provider to call the local or state health department.  Wear a facemask You should wear a facemask that covers your nose and mouth when you are in the same room with other people and when you visit a healthcare provider. People who live with or visit you should also wear a facemask while they are in the same room with you.  Separate yourself from other people in your home As much as possible, you should stay in a different room from other people in your home. Also, you should use a separate bathroom, if available.  Avoid sharing household items You should not share dishes, drinking glasses, cups, eating utensils, towels, bedding, or other items with other people in your home. After using these items, you should wash them thoroughly with soap and water.  Cover your coughs and sneezes Cover your mouth and nose with a tissue when you cough or sneeze, or you can cough or sneeze into your sleeve. Throw used tissues in a lined trash can, and immediately wash your hands with soap and water for at least 20 seconds or use an alcohol-based hand rub.  Wash your [facility] your hands often and thoroughly with soap and water for at least 20 seconds. You can use an  alcohol-based hand sanitizer if soap and water are not available and if your hands are not visibly dirty. Avoid touching your eyes, nose, and mouth with unwashed hands.   Prevention Steps for Caregivers and Household Members of Individuals Confirmed to have, or Being Evaluated for, COVID-19 Infection Being Cared for in the Home  If you live with, or provide care at home for, a person confirmed to have, or being evaluated for, COVID-19  infection please follow these guidelines to prevent infection:  Follow healthcare provider's instructions Make sure that you understand and can help the patient follow any healthcare provider instructions for all care.  Provide for the patient's basic needs You should help the patient with basic needs in the home and provide support for getting groceries, prescriptions, and other personal needs.  Monitor the patient's symptoms If they are getting sicker, call his or her medical provider and tell them that the patient has, or is being evaluated for, COVID-19 infection. This will help the healthcare provider's office take steps to keep other people from getting infected. Ask the healthcare provider to call the local or state health department.  Limit the number of people who have contact with the patient  If possible, have only one caregiver for the patient.  Other household members should stay in another home or place of residence. If this is not possible, they should stay  in another room, or be separated from the patient as much as possible. Use a separate bathroom, if available.  Restrict visitors who do not have an essential need to be in the home.  Keep older adults, very young children, and other sick people away from the patient Keep older adults, very young children, and those who have compromised immune systems or chronic health conditions away from the patient. This includes people with chronic heart, lung, or kidney conditions, diabetes, and cancer.  Ensure good ventilation Make sure that shared spaces in the home have good air flow, such as from an air conditioner or an opened window, weather permitting.  Wash your hands often  Wash your hands often and thoroughly with soap and water for at least 20 seconds. You can use an alcohol based hand sanitizer if soap and water are not available and if your hands are not visibly dirty.  Avoid touching your eyes, nose, and mouth  with unwashed hands.  Use disposable paper towels to dry your hands. If not available, use dedicated cloth towels and replace them when they become wet.  Wear a facemask and gloves  Wear a disposable facemask at all times in the room and gloves when you touch or have contact with the patient's blood, body fluids, and/or secretions or excretions, such as sweat, saliva, sputum, nasal mucus, vomit, urine, or feces.  Ensure the mask fits over your nose and mouth tightly, and do not touch it during use.  Throw out disposable facemasks and gloves after using them. Do not reuse.  Wash your hands immediately after removing your facemask and gloves.  If your personal clothing becomes contaminated, carefully remove clothing and launder. Wash your hands after handling contaminated clothing.  Place all used disposable facemasks, gloves, and other waste in a lined container before disposing them with other household waste.  Remove gloves and wash your hands immediately after handling these items.  Do not share dishes, glasses, or other household items with the patient  Avoid sharing household items. You should not share dishes, drinking glasses, cups, eating utensils, towels, bedding, or other  items with a patient who is confirmed to have, or being evaluated for, COVID-19 infection.  After the person uses these items, you should wash them thoroughly with soap and water.  Wash laundry thoroughly  Immediately remove and wash clothes or bedding that have blood, body fluids, and/or secretions or excretions, such as sweat, saliva, sputum, nasal mucus, vomit, urine, or feces, on them.  Wear gloves when handling laundry from the patient.  Read and follow directions on labels of laundry or clothing items and detergent. In general, wash and dry with the warmest temperatures recommended on the label.  Clean all areas the individual has used often  Clean all touchable surfaces, such as counters, tabletops,  doorknobs, bathroom fixtures, toilets, phones, keyboards, tablets, and bedside tables, every day. Also, clean any surfaces that may have blood, body fluids, and/or secretions or excretions on them.  Wear gloves when cleaning surfaces the patient has come in contact with.  Use a diluted bleach solution (e.g., dilute bleach with 1 part bleach and 10 parts water) or a household disinfectant with a label that says EPA-registered for coronaviruses. To make a bleach solution at home, add 1 tablespoon of bleach to 1 quart (4 cups) of water. For a larger supply, add  cup of bleach to 1 gallon (16 cups) of water.  Read labels of cleaning products and follow recommendations provided on product labels. Labels contain instructions for safe and effective use of the cleaning product including precautions you should take when applying the product, such as wearing gloves or eye protection and making sure you have good ventilation during use of the product.  Remove gloves and wash hands immediately after cleaning.  Monitor yourself for signs and symptoms of illness Caregivers and household members are considered close contacts, should monitor their health, and will be asked to limit movement outside of the home to the extent possible. Follow the monitoring steps for close contacts listed on the symptom monitoring form.   ? If you have additional questions, contact your local health department or call the epidemiologist on call at [phone] (available 24/7). ? This guidance is subject to change. For the most up-to-date guidance from [facility], please refer to their website: [email or web address]

## 2020-04-14 NOTE — Progress Notes (Signed)
Notified on call phys regarding elevated BP- no new orders at this time

## 2020-04-15 ENCOUNTER — Ambulatory Visit (HOSPITAL_COMMUNITY)
Admit: 2020-04-15 | Discharge: 2020-04-15 | Disposition: A | Discharge status: Home / Self Care | Payer: Medicare Other | Attending: Pulmonary Disease | Admitting: Pulmonary Disease

## 2020-04-15 DIAGNOSIS — J1282 Pneumonia due to coronavirus disease 2019: Secondary | ICD-10-CM | POA: Insufficient documentation

## 2020-04-15 DIAGNOSIS — U071 COVID-19: Secondary | ICD-10-CM | POA: Diagnosis not present

## 2020-04-15 MED ORDER — EPINEPHRINE 0.3 MG/0.3ML IJ SOAJ: 0.3000 mg | Freq: Once | INTRAMUSCULAR | Status: DC | PRN

## 2020-04-15 MED ORDER — METHYLPREDNISOLONE SODIUM SUCC 125 MG IJ SOLR: 125.0000 mg | Freq: Once | INTRAMUSCULAR | Status: DC | PRN

## 2020-04-15 MED ORDER — SODIUM CHLORIDE 0.9 % IV SOLN: INTRAVENOUS | Status: DC | PRN

## 2020-04-15 MED ORDER — ALBUTEROL SULFATE HFA 108 (90 BASE) MCG/ACT IN AERS: 2.0000 | INHALATION_SPRAY | Freq: Once | RESPIRATORY_TRACT | Status: DC | PRN

## 2020-04-15 MED ORDER — DIPHENHYDRAMINE HCL 50 MG/ML IJ SOLN: 50.0000 mg | Freq: Once | INTRAMUSCULAR | Status: DC | PRN

## 2020-04-15 MED ORDER — SODIUM CHLORIDE 0.9 % IV SOLN
100.0000 mg | Freq: Once | INTRAVENOUS | Status: AC
  Administered 2020-04-15: 100 mg via INTRAVENOUS
  Filled 2020-04-15: qty 20

## 2020-04-15 MED ORDER — FAMOTIDINE IN NACL 20-0.9 MG/50ML-% IV SOLN: 20.0000 mg | Freq: Once | INTRAVENOUS | Status: DC | PRN

## 2020-04-15 NOTE — Progress Notes (Signed)
  Diagnosis: COVID-19  Physician: Dr. Asencion Noble  Procedure: Covid Infusion Clinic Med: remdesivir infusion - Provided patient with remdesivir fact sheet for patients, parents and caregivers prior to infusion.  Complications: No immediate complications noted.  Discharge: Discharged home   Janine Ores 04/15/2020

## 2020-04-15 NOTE — Discharge Instructions (Signed)
10 Things You Can Do to Manage Your COVID-19 Symptoms at Home If you have possible or confirmed COVID-19: 1. Stay home from work and school. And stay away from other public places. If you must go out, avoid using any kind of public transportation, ridesharing, or taxis. 2. Monitor your symptoms carefully. If your symptoms get worse, call your healthcare provider immediately. 3. Get rest and stay hydrated. 4. If you have a medical appointment, call the healthcare provider ahead of time and tell them that you have or may have COVID-19. 5. For medical emergencies, call 911 and notify the dispatch personnel that you have or may have COVID-19. 6. Cover your cough and sneezes with a tissue or use the inside of your elbow. 7. Wash your hands often with soap and water for at least 20 seconds or clean your hands with an alcohol-based hand sanitizer that contains at least 60% alcohol. 8. As much as possible, stay in a specific room and away from other people in your home. Also, you should use a separate bathroom, if available. If you need to be around other people in or outside of the home, wear a mask. 9. Avoid sharing personal items with other people in your household, like dishes, towels, and bedding. 10. Clean all surfaces that are touched often, like counters, tabletops, and doorknobs. Use household cleaning sprays or wipes according to the label instructions. cdc.gov/coronavirus 02/09/2019 This information is not intended to replace advice given to you by your health care provider. Make sure you discuss any questions you have with your health care provider. Document Revised: 07/14/2019 Document Reviewed: 07/14/2019 Elsevier Patient Education  2020 Elsevier Inc.  

## 2020-04-16 ENCOUNTER — Ambulatory Visit (HOSPITAL_COMMUNITY)
Admit: 2020-04-16 | Discharge: 2020-04-16 | Disposition: A | Discharge status: Home / Self Care | Payer: Medicare Other | Attending: Pulmonary Disease | Admitting: Pulmonary Disease

## 2020-04-16 DIAGNOSIS — U071 COVID-19: Secondary | ICD-10-CM | POA: Diagnosis not present

## 2020-04-16 MED ORDER — EPINEPHRINE 0.3 MG/0.3ML IJ SOAJ: 0.3000 mg | Freq: Once | INTRAMUSCULAR | Status: DC | PRN

## 2020-04-16 MED ORDER — FAMOTIDINE IN NACL 20-0.9 MG/50ML-% IV SOLN: 20.0000 mg | Freq: Once | INTRAVENOUS | Status: DC | PRN

## 2020-04-16 MED ORDER — SODIUM CHLORIDE 0.9 % IV SOLN
100.0000 mg | Freq: Once | INTRAVENOUS | Status: AC
  Administered 2020-04-16: 100 mg via INTRAVENOUS
  Filled 2020-04-16: qty 20

## 2020-04-16 MED ORDER — SODIUM CHLORIDE 0.9 % IV SOLN: INTRAVENOUS | Status: DC | PRN

## 2020-04-16 MED ORDER — ALBUTEROL SULFATE HFA 108 (90 BASE) MCG/ACT IN AERS: 2.0000 | INHALATION_SPRAY | Freq: Once | RESPIRATORY_TRACT | Status: DC | PRN

## 2020-04-16 MED ORDER — METHYLPREDNISOLONE SODIUM SUCC 125 MG IJ SOLR: 125.0000 mg | Freq: Once | INTRAMUSCULAR | Status: DC | PRN

## 2020-04-16 MED ORDER — DIPHENHYDRAMINE HCL 50 MG/ML IJ SOLN: 50.0000 mg | Freq: Once | INTRAMUSCULAR | Status: DC | PRN

## 2020-04-16 NOTE — Discharge Instructions (Signed)
10 Things You Can Do to Manage Your COVID-19 Symptoms at Home If you have possible or confirmed COVID-19: 1. Stay home from work and school. And stay away from other public places. If you must go out, avoid using any kind of public transportation, ridesharing, or taxis. 2. Monitor your symptoms carefully. If your symptoms get worse, call your healthcare provider immediately. 3. Get rest and stay hydrated. 4. If you have a medical appointment, call the healthcare provider ahead of time and tell them that you have or may have COVID-19. 5. For medical emergencies, call 911 and notify the dispatch personnel that you have or may have COVID-19. 6. Cover your cough and sneezes with a tissue or use the inside of your elbow. 7. Wash your hands often with soap and water for at least 20 seconds or clean your hands with an alcohol-based hand sanitizer that contains at least 60% alcohol. 8. As much as possible, stay in a specific room and away from other people in your home. Also, you should use a separate bathroom, if available. If you need to be around other people in or outside of the home, wear a mask. 9. Avoid sharing personal items with other people in your household, like dishes, towels, and bedding. 10. Clean all surfaces that are touched often, like counters, tabletops, and doorknobs. Use household cleaning sprays or wipes according to the label instructions. cdc.gov/coronavirus 02/09/2019 This information is not intended to replace advice given to you by your health care provider. Make sure you discuss any questions you have with your health care provider. Document Revised: 07/14/2019 Document Reviewed: 07/14/2019 Elsevier Patient Education  2020 Elsevier Inc.  

## 2020-04-16 NOTE — Progress Notes (Signed)
  Diagnosis: COVID-19  Physician: Dr. Joya Gaskins  Procedure: Covid Infusion Clinic Med: remdesivir infusion - Provided patient with remdesivir fact sheet for patients, parents and caregivers prior to infusion.  Complications: No immediate complications noted.  Discharge: Discharged home   Seth [name] 04/16/2020

## 2020-04-17 LAB — CULTURE, BLOOD (ROUTINE X 2)
Culture: NO GROWTH
Special Requests: ADEQUATE

## 2020-07-16 IMAGING — CT CT ANGIO CHEST
2 of 8 series · 18 of 36 positions shown · IV contrast (iopamidol)
Comparison: Chest radiograph August 30, 2018

CLINICAL DATA: Syncope and shortness of breath. History of prostate
carcinoma

EXAM:
CT ANGIOGRAPHY CHEST WITH CONTRAST
TECHNIQUE: Multidetector CT imaging of the chest was performed using the
standard protocol during bolus administration of intravenous
contrast. Multiplanar CT image reconstructions and MIPs were
obtained to evaluate the vascular anatomy.
CONTRAST:  100mL K7GIGK-CJF IOPAMIDOL (K7GIGK-CJF) INJECTION 76%

[Series 6: pe thins · axial · 0.76mm/px · z∈[-398,-124]mm · 17 of 308 slices shown]
[im 17/308  lung]
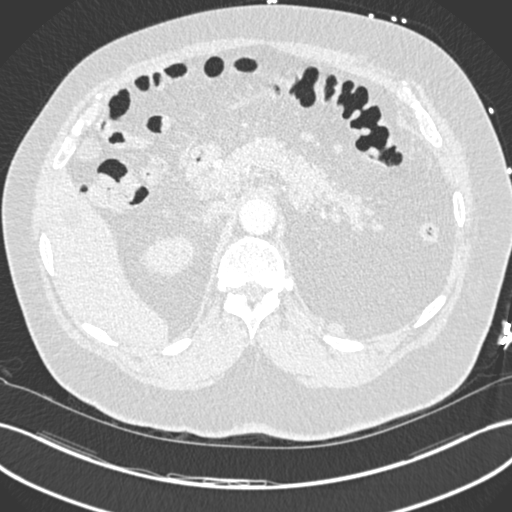
[im 33/308  mediastinal]
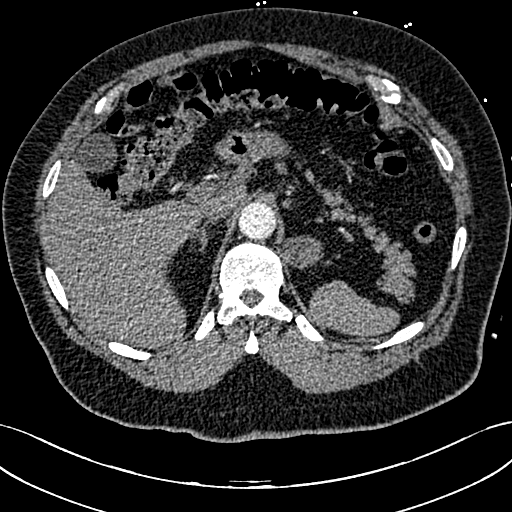
[im 49/308  lung]
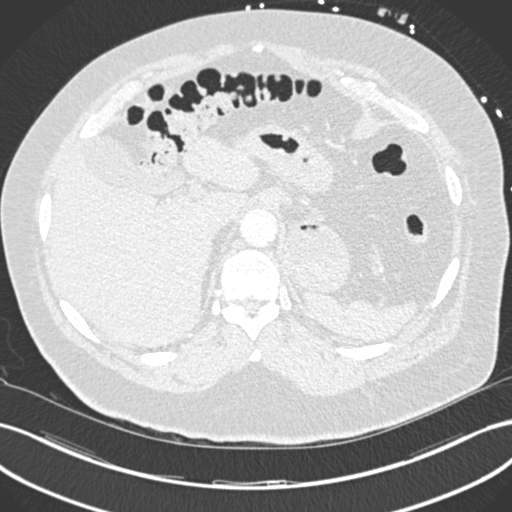
[im 65/308  mediastinal]
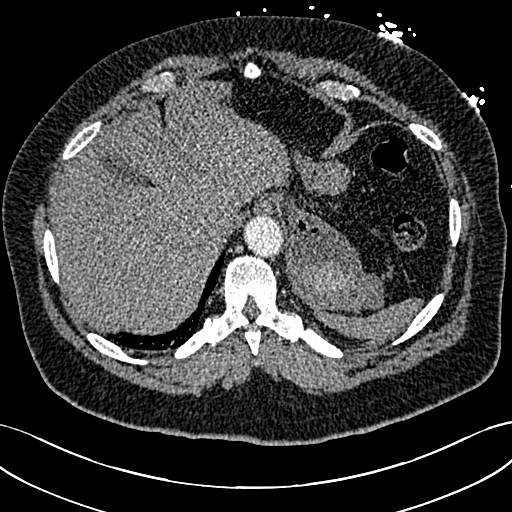
[im 81/308  lung]
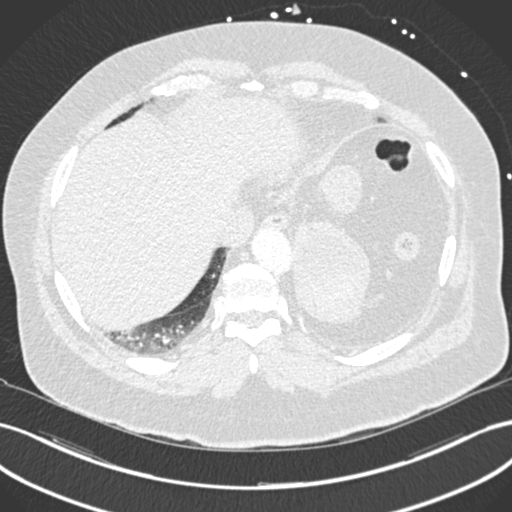
[im 97/308  mediastinal]
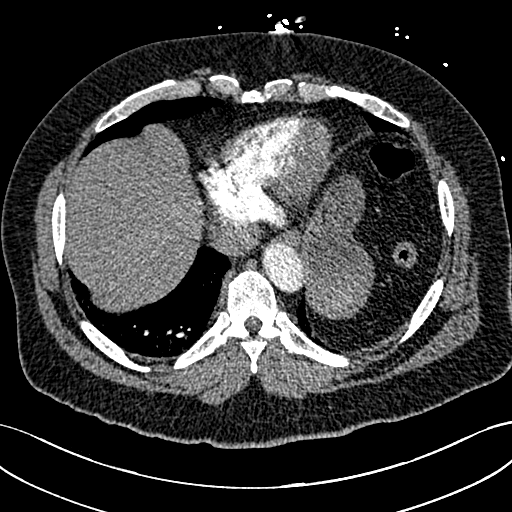
[im 114/308  lung]
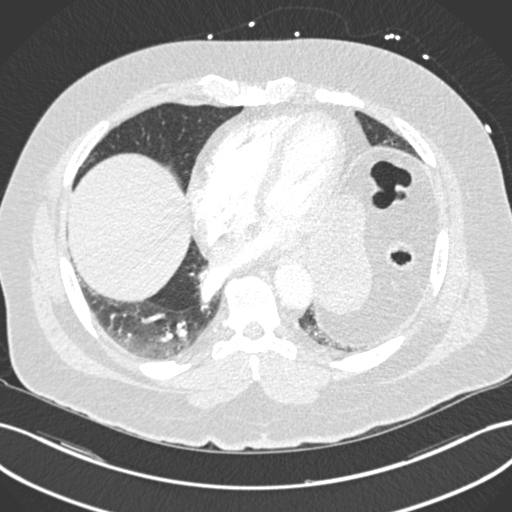
[im 130/308  mediastinal]
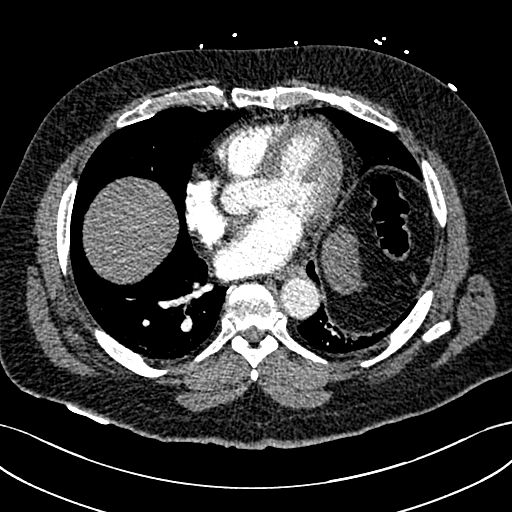
[im 162/308  lung]
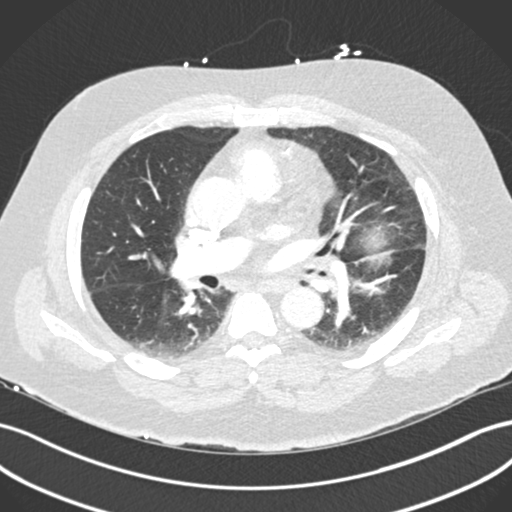
[im 178/308  mediastinal]
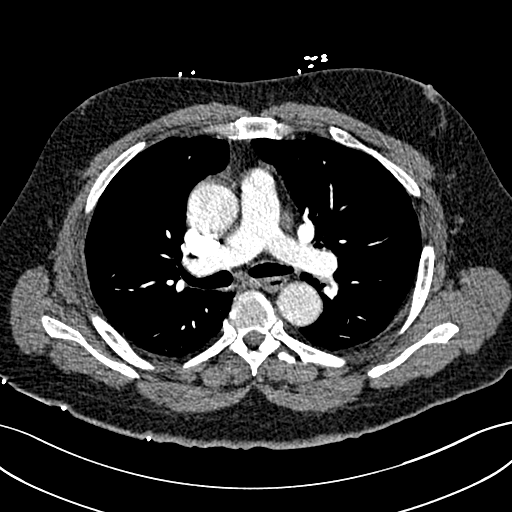
[im 194/308  lung]
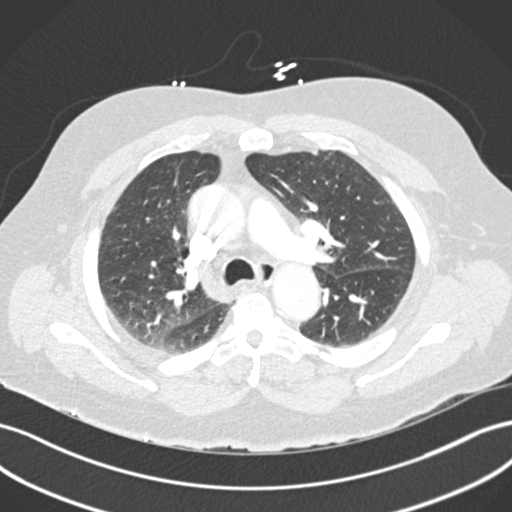
[im 211/308  mediastinal]
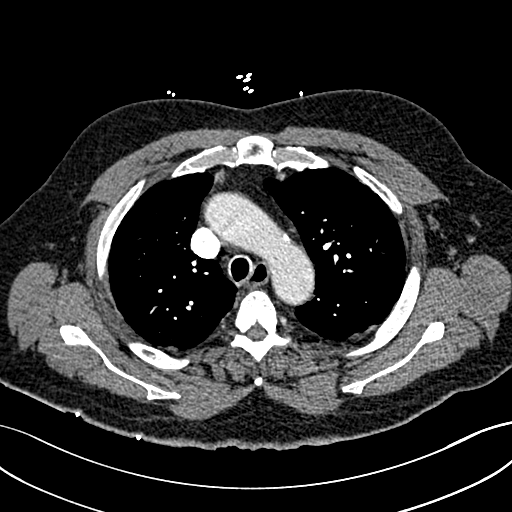
[im 227/308  lung]
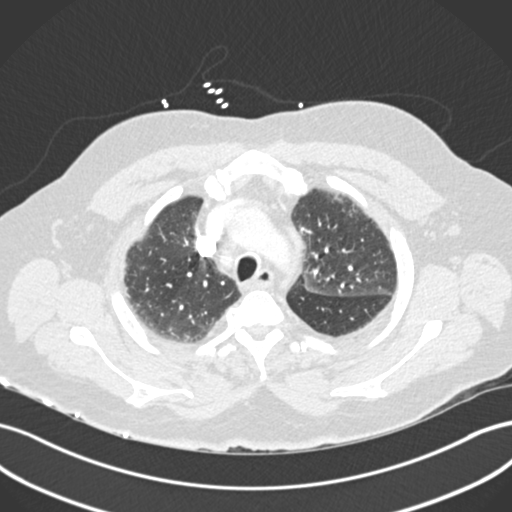
[im 243/308  mediastinal]
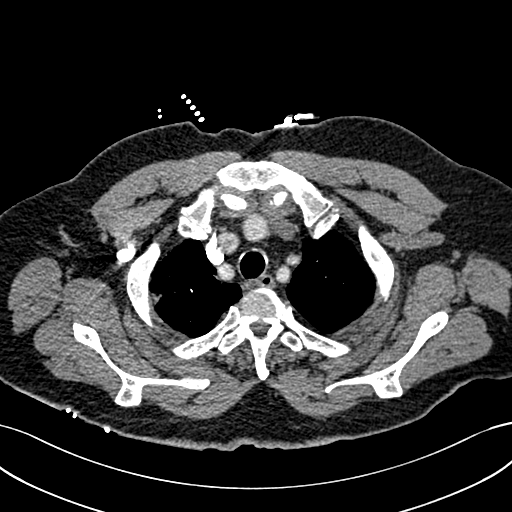
[im 259/308  lung]
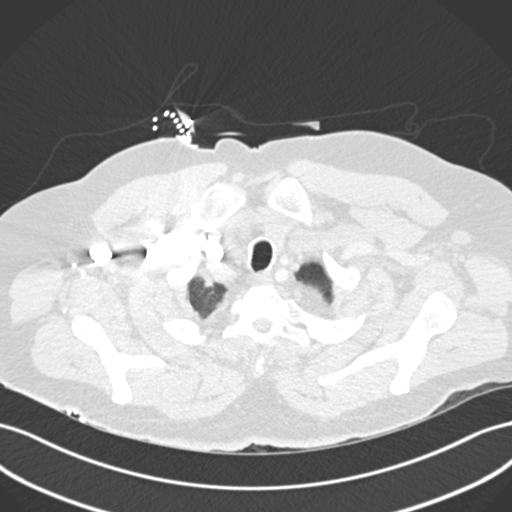
[im 275/308  mediastinal]
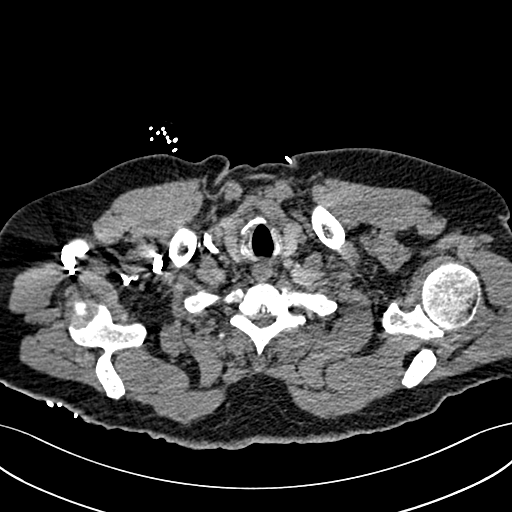
[im 291/308  lung]
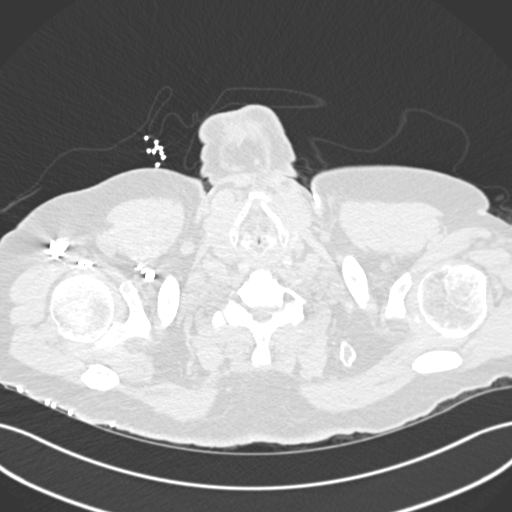

[Series 7: pe coronal mpr · coronal · 0.61mm/px · 1 of 170 slices shown]
[im 85/170  mediastinal]
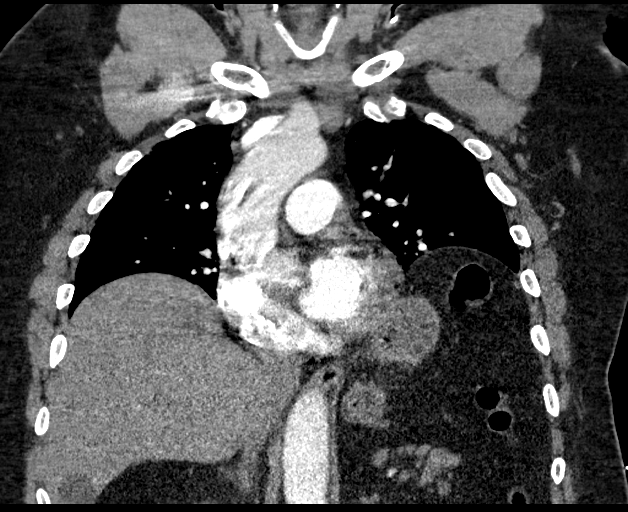

[18 of 36 positions shown; findings below may reference images not displayed]

FINDINGS: Cardiovascular: There is no demonstrable pulmonary embolus. There is
no thoracic aortic aneurysm or dissection. The visualized great
vessels appear unremarkable. Note that the right innominate and left
common carotid arteries arise as a common trunk. The left proximal
common carotid artery is quite tortuous. There is aortic
atherosclerosis. There are occasional foci of coronary artery
calcification. There is no pericardial effusion or pericardial
thickening.

Mediastinum/Nodes: Thyroid appears unremarkable. There is no
appreciable thoracic adenopathy. No esophageal lesions are evident.

Lungs/Pleura: There is atelectatic change in both lower lobes. There
is no appreciable edema or consolidation. There is no appreciable
pleural effusion or pleural thickening evident.

Upper Abdomen: There is a cyst in the posterior segment right lobe
of the liver measuring 2.3 x 2.3 cm. There is a cyst arising from
the lateral upper pole right kidney measuring 2.1 x 2.1 cm. There is
a probable cyst arising from the upper pole left kidney measuring
approximately 2 cm, incompletely visualized. There is an apparent
diverticulum arising from the greater curvature of the stomach
measuring 2.7 x 1.9 cm. Visualized upper abdominal structures
otherwise appear unremarkable.

Musculoskeletal: There are no appreciable blastic or lytic bone
lesions. No evident chest wall lesions.

Review of the MIP images confirms the above findings.
IMPRESSION: 1. No demonstrable pulmonary embolus. No thoracic aortic aneurysm or
dissection. There is aortic atherosclerosis. There are foci of
coronary artery calcification.

2.  Areas of atelectatic change.  No edema or consolidation.

3.  No appreciable thoracic adenopathy.

4. Apparent diverticulum arising from the distal greater curvature
of the stomach measuring 2.7 x 1.9 cm.

Aortic Atherosclerosis (AC59F-DJ5.5).

## 2022-02-27 IMAGING — DX DG CHEST 1V PORT
1 series · 1 of 1 positions shown · non-contrast
Comparison: August 30, 2018

CLINICAL DATA: Dizziness, UTI

EXAM:
PORTABLE CHEST 1 VIEW

[chest ap]
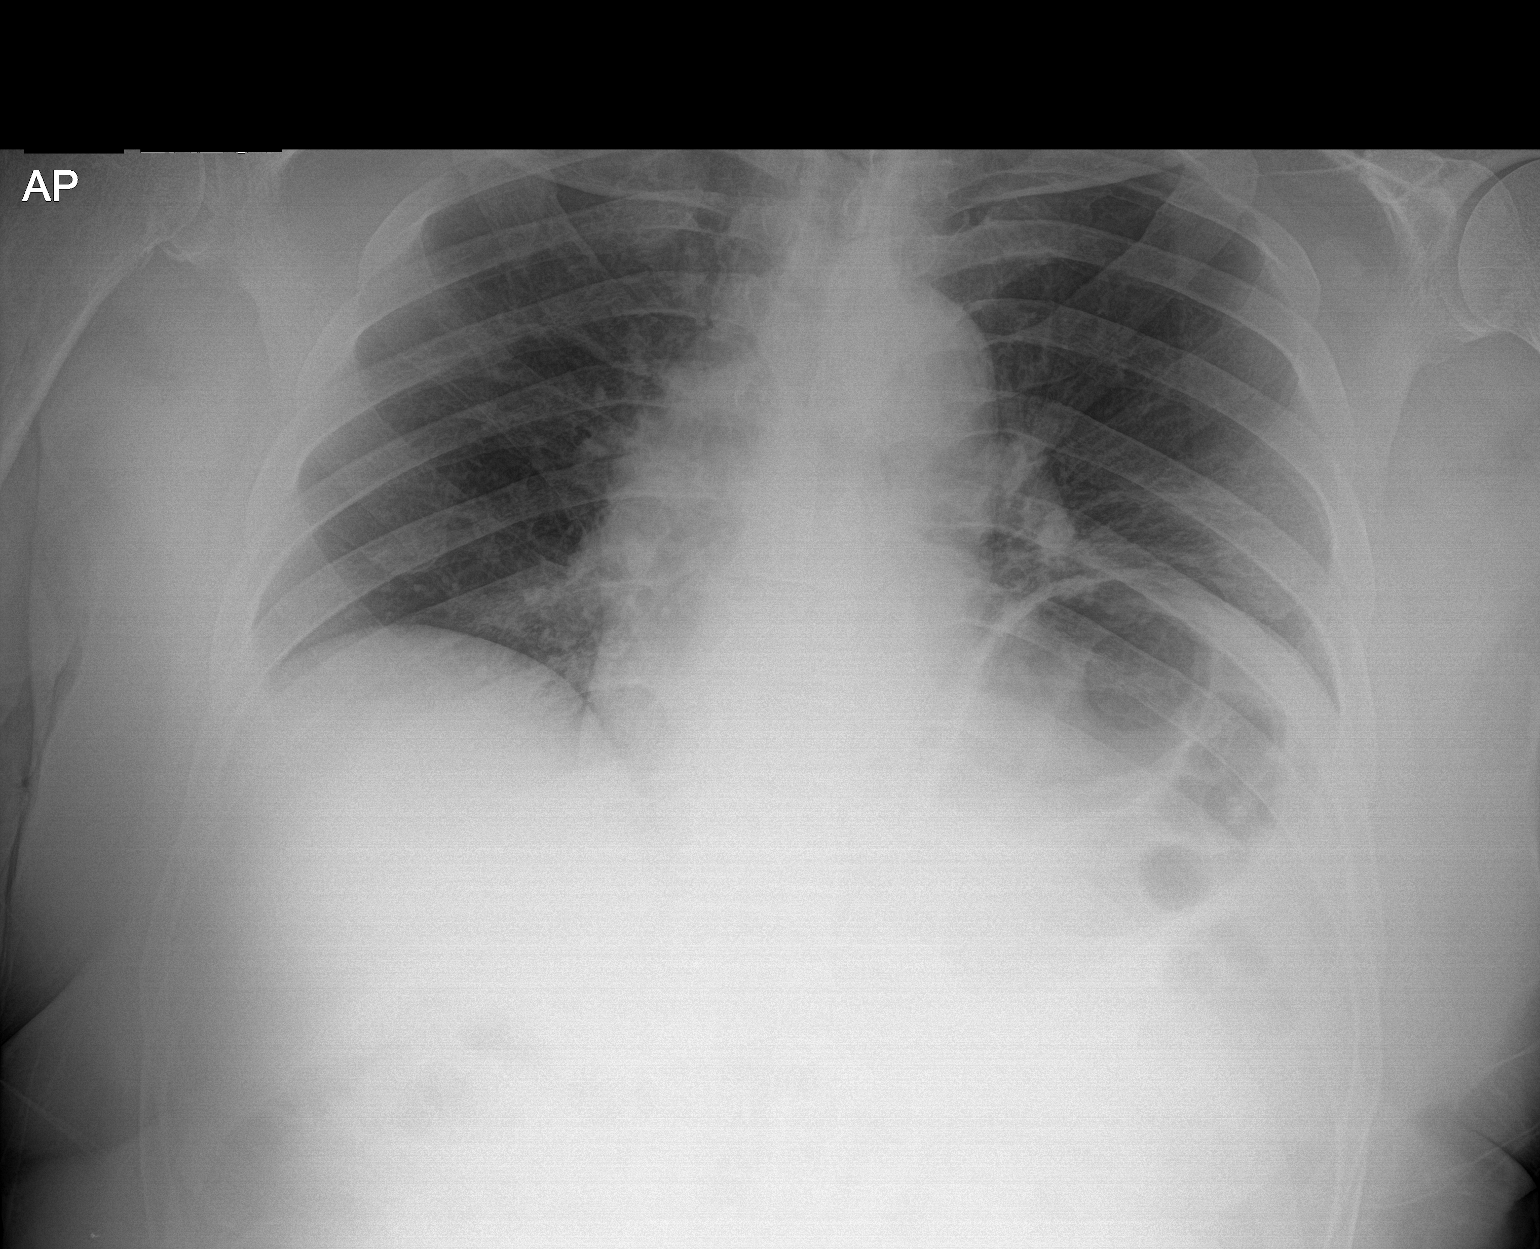

[1 of 1 positions shown; findings below may reference images not displayed]

FINDINGS: Trachea midline.

Lung volumes are diminished with persistent elevation of the LEFT
hemidiaphragm and juxta diaphragmatic atelectasis. Slightly
increased airspace opacity over the LEFT hemidiaphragm aside from
expected changes of atelectasis. No lobar consolidation. No pleural
effusion.

On limited assessment skeletal structures without acute process.
IMPRESSION: 1. Persistent elevation of the LEFT hemidiaphragm and juxta
diaphragmatic atelectasis.
2. Area of vague opacity over lying the LEFT hemidiaphragm may
reflect more pronounced atelectatic change, developing infection in
this area is not excluded

## 2023-08-26 NOTE — Therapy (Addendum)
OUTPATIENT PHYSICAL THERAPY LOWER EXTREMITY EVALUATION   Patient Name: Seth [name] MRN: [id number] DOB:08-10-1941, 83 y.o., male Today's Date: 08/31/2023   END OF SESSION:  PT End of Session - 08/31/23 1401     Visit Number 1    Date for PT Re-Evaluation 10/26/23    Authorization Type [employer]    Authorization Time Period Auth pending: 08/31/23 - 10/26/23    Authorization - Visit Number 1    Progress Note Due on Visit 10    PT Start Time 1401    PT Stop Time 1449    PT Time Calculation (min) 48 min    Activity Tolerance Patient tolerated treatment well    Behavior During Therapy [facility] for tasks assessed/performed             Past Medical History:  Diagnosis Date   Arthritis    Cancer (HCC)    hx of prostate cancer    History of kidney stones    Hypertension    Pneumonia    06/2015    Past Surgical History:  Procedure Laterality Date   PROSTATECTOMY     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   Procedure: RIGHT TOTAL KNEE ARTHROPLASTY;  Surgeon: Ollen Gross, MD;  Location: [facility];  Service: Orthopedics;  Laterality: Right;   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   Procedure: TOTAL KNEE ARTHROPLASTY;  Surgeon: Ollen Gross, MD;  Location: [facility];  Service: Orthopedics;  Laterality: Left;    Patient Active Problem List   Diagnosis Date Noted   Sepsis (HCC) 04/13/2020   Pneumonia due to COVID-19 virus 04/13/2020   Acute hypoxemic respiratory failure (HCC) 04/13/2020   Elevated troponin 04/13/2020   Syncope 08/30/2018   Essential hypertension 08/30/2018   AKI (acute kidney injury) (HCC) 08/30/2018   Hyponatremia 08/30/2018   Volume depletion 08/30/2018   OA (osteoarthritis) of knee 09/10/2015    PCP: Drosinis, Leonia Reader, PA-C   REFERRING PROVIDER: Bill Salinas, PA-C   REFERRING DIAG: [phone] (ICD-10-CM) - Right knee pain / R LE hamstring strain (biceps femoris)  THERAPY DIAG:  Pain in right thigh - Plan: PT plan of care  cert/re-cert  Chronic pain of right knee - Plan: PT plan of care cert/re-cert  Muscle weakness (generalized) - Plan: PT plan of care cert/re-cert  Other muscle spasm  Other abnormalities of gait and mobility - Plan: PT plan of care cert/re-cert  RATIONALE FOR EVALUATION AND TREATMENT: Rehabilitation  ONSET DATE: ~6-8 months  NEXT MD VISIT: PRN   SUBJECTIVE:  SUBJECTIVE STATEMENT: Pt reports pain in R posterior lateral thigh for the past 6-8 months w/o known MOI.  He states he feels like at one point in time he had a "charley horse" in his R thigh.  He notes constant pain at ~4/10.  He states his R knee acts up at times since his TKA.  PAIN: Are you having pain? Yes: NPRS scale: 4/10 constant pain, at worst up to 7/10 Pain location: R posterior lateral thigh Pain description: constant aching Aggravating factors: "just there" Relieving factors: nothing  PERTINENT HISTORY:  L TKA 2020, R TKA 2017, OA, HTN, prostate cancer  PRECAUTIONS: Fall  RED FLAGS: None  WEIGHT BEARING RESTRICTIONS: No  FALLS:  Has patient fallen in last 6 months? No, but FOF  LIVING ENVIRONMENT: Lives with: lives with their family Lives in: House/apartment Stairs: Yes: Internal: 14 steps; to basement (no need to access) and External: 2 steps; on right going up, on left going up, and can reach both Has following equipment at home: Single point cane and Grab bars  OCCUPATION: Retired  PLOF: Independent and Leisure: mostly sedentary - look at [employer], work in the yard when the weather is nicer  PATIENT GOALS: "To use my R leg completely."   OBJECTIVE: (objective measures completed at initial evaluation unless otherwise dated)  DIAGNOSTIC FINDINGS:  07/30/23 - Peripheral venous US - R LE: IMPRESSION: No  evidence of deep vein thrombosis.   PATIENT SURVEYS:  LEFS 20 / 80 = 25.0 %  COGNITION: Overall cognitive status: Within functional limits for tasks assessed    SENSATION: WFL  EDEMA:  N/A  POSTURE:  rounded shoulders, forward head, flexed trunk , and genu valgum bilaterally  PALPATION: Increased muscle tension and TTP along R lateral HS  MUSCLE LENGTH: Hamstrings: mild/mod tight R>L ITB: mod tight L>R Piriformis: WFL Hip IR: mod tight R>L Hip flexors: mod tight R>L Quads: mod tight R>L  LOWER EXTREMITY ROM:  Active ROM Right eval Left eval  Hip flexion    Hip extension    Hip abduction    Hip adduction    Hip internal rotation    Hip external rotation    Knee flexion 104 110  Knee extension 0 0  Ankle dorsiflexion    Ankle plantarflexion    Ankle inversion    Ankle eversion     Passive ROM Right eval Left eval  Hip flexion    Hip extension    Hip abduction    Hip adduction    Hip internal rotation    Hip external rotation    Knee flexion 110 110  Knee extension    Ankle dorsiflexion    Ankle plantarflexion    Ankle inversion    Ankle eversion    (Blank rows = not tested)  LOWER EXTREMITY MMT:  MMT Right eval Left eval  Hip flexion 3+ 4  Hip extension 2+ 3-  Hip abduction 2+ 3+  Hip adduction 3- 4-  Hip internal rotation 3- 4-  Hip external rotation 4- 4  Knee flexion 4- 4+  Knee extension 4 4+  Ankle dorsiflexion 4- 4+  Ankle plantarflexion    Ankle inversion    Ankle eversion     (Blank rows = not tested)  FUNCTIONAL TESTS:  5 times sit to stand:   Timed up and go (TUG):   10 meter walk test: 35.09 sec with SPC, 0.93 ft/sec with [facility] Berg Balance Scale:   Dynamic Gait Index:    GAIT: Distance  walked: Clinic distances Assistive device utilized: Single point cane Level of assistance: Modified independence Gait pattern: decreased stride length, knee flexed in stance- Right, knee flexed in stance- Left, trunk flexed, and narrow  BOS Comments: Stooped posture with genu valgum during gait   TODAY'S TREATMENT:   08/31/2023 - Eval THERAPEUTIC EXERCISE: to improve flexibility, strength and mobility.  Demonstration, verbal and tactile cues throughout for technique.  Seated R hamstring stretch with strap 3 x 30" - latter 2 reps including hip ER and IR to isolate medial and lateral hamstrings Seated R hamstring isometric 10 x 5"   PATIENT EDUCATION:  Education details: PT eval findings, anticipated POC, and initial HEP  Person educated: Patient Education method: Explanation, Demonstration, Verbal cues, and Handouts Education comprehension: verbalized understanding, returned demonstration, verbal cues required, and needs further education  HOME EXERCISE PROGRAM: Access Code: [email or web address] URL: [email or web address] Date: 08/31/2023 Prepared by: Glenetta Hew  Exercises - Seated Hamstring Stretch with Strap  - 2 x daily - 7 x weekly - 3 reps - 30 sec hold - Seated Hamstring Set  - 2 x daily - 7 x weekly - 2 sets - 10 reps - 3 sec hold   ASSESSMENT:  CLINICAL IMPRESSION: DJANGO ANTHIS is a 83 y.o. male who was referred to physical therapy for evaluation and treatment for R proximal LE and knee pain secondary to hamstring strain.  Patient reports onset of R posterior lateral thigh pain beginning ~6-8 months ago without known MOI, however thinks he may recall having had a "charley horse" in his hamstring at some point.  He also notes ongoing intermittent R knee pain since his R TKA in 2017.  He is unable to identify aggravating factors, stating pain is "just there", nor is he able to find anything that gives him significant relief from pain. Patient has deficits in B knee ROM, B proximal LE flexibility, BLE strength, abnormal posture and gait mechanics, and TTP with abnormal muscle tension in R lateral hamstring which are interfering with ADLs and are impacting quality of life.  On LEFS patient scored 20/80  demonstrating 75% disability.  Gait speed significantly decreased at 0.93 ft/sec with SPC placing him at a household ambulator level with an elevated risk for recurrent falls, therefore we will plan for further standardized balance testing next visit.  Acie will benefit from skilled PT to address above deficits to improve mobility and activity tolerance with decreased pain interference.  OBJECTIVE IMPAIRMENTS: Abnormal gait, decreased activity tolerance, decreased balance, decreased endurance, decreased knowledge of condition, decreased knowledge of use of DME, decreased mobility, difficulty walking, decreased ROM, decreased strength, decreased safety awareness, hypomobility, increased fascial restrictions, impaired perceived functional ability, increased muscle spasms, impaired flexibility, improper body mechanics, postural dysfunction, and pain.   ACTIVITY LIMITATIONS: carrying, lifting, bending, sitting, standing, squatting, sleeping, stairs, transfers, bed mobility, and locomotion level  PARTICIPATION LIMITATIONS: meal prep, cleaning, laundry, driving, shopping, community activity, and yard work  PERSONAL FACTORS: Age, Fitness, Past/current experiences, Time since onset of injury/illness/exacerbation, and 3+ comorbidities: L TKA 2020, R TKA 2017, OA, HTN, prostate cancer  are also affecting patient's functional outcome.   REHAB POTENTIAL: Good  CLINICAL DECISION MAKING: Evolving/moderate complexity  EVALUATION COMPLEXITY: Moderate   GOALS: Goals reviewed with patient? Yes  SHORT TERM GOALS: Target date: 09/28/2023  Patient will be independent with initial HEP. Baseline:  Goal status: INITIAL  2.  Patient will report at least 25% improvement in R thigh and knee pain to improve QOL. Baseline: 4/10 constant  pain, at worst up to 7/10 Goal status: INITIAL  3.  Complete standardized balance testing and update POC/goals as indicated. Baseline:  Goal status: INITIAL   LONG TERM GOALS:  Target date: 10/26/2023  Patient will be independent with advanced/ongoing HEP to improve outcomes and carryover.  Baseline:  Goal status: INITIAL  2.  Patient will report at least 50-75% improvement in R thigh and knee pain to improve QOL. Baseline: 4/10 constant pain, at worst up to 7/10 Goal status: INITIAL  3.  Patient will demonstrate improved R knee AROM to >/= 0-110 deg to allow for normal gait and stair mechanics. Baseline: R knee AROM 0-104 Goal status: INITIAL  4.  Patient will demonstrate improved B LE strength to >/= 4 to 4+/5 for improved stability and ease of mobility. Baseline: Refer to above LE MMT table Goal status: INITIAL  5.  Patient will be able to ambulate with LRAD and normal gait pattern at gait speed of >/= 1.8 ft/sec without increased pain for safe limited community access with decreased risk for recurrent falls.  Baseline: 0.93 ft/sec Goal status: INITIAL  6. Patient will be able to ascend/descend stairs with 1 HR and reciprocal step pattern safely to access home and community.  Baseline:  Goal status: INITIAL  7.  Patient will report >/= 29/80 on LEFS (MCID = 9 pts) to demonstrate improved functional ability. Baseline: 20 / 80 = 25.0 % Goal status: INITIAL  8.  Patient will demonstrate at least 15/24 on DGI to decrease risk of falls. Baseline: TBD Goal status: INITIAL    PLAN:  PT FREQUENCY: 2x/week  PT DURATION: 8 weeks  PLANNED INTERVENTIONS: 97164- PT Re-evaluation, 97110-Therapeutic exercises, 97530- Therapeutic activity, [id number]- Neuromuscular re-education, 97535- Self Care, 16109- Manual therapy, [id number]- Gait training, [phone]- Aquatic Therapy, 97014- Electrical stimulation (unattended), [phone]- Electrical stimulation (manual), 97016- Vasopneumatic device, [id number]- Ultrasound, 91478- Ionotophoresis 4mg /ml Dexamethasone, Patient/Family education, Balance training, Stair training, Taping, Dry Needling, Joint mobilization, Scar mobilization, DME  instructions, Cryotherapy, and Moist heat  PLAN FOR NEXT SESSION: Complete standardized balance testing - 5xSTS, TUG, Berg, DGI; review initial HEP; progress proximal LE flexibility and strengthening - update HEP accordingly; MT +/- DN to address abnormal muscle tension in R lateral hamstrings   Marry Guan, PT 08/31/2023, 6:39 PM     Date of referral: 07/30/2023 Referring provider: Bill Salinas, PA-C Referring diagnosis? M25.561 (ICD-10-CM) - Right knee pain / R LE hamstring strain (biceps femoris) Treatment diagnosis? (if different than referring diagnosis)  Pain in right thigh - Plan: PT plan of care cert/re-cert  Chronic pain of right knee - Plan: PT plan of care cert/re-cert  Muscle weakness (generalized) - Plan: PT plan of care cert/re-cert  Other muscle spasm  Other abnormalities of gait and mobility - Plan: PT plan of care cert/re-cert  What was this (referring dx) caused by? Ongoing Issue and Arthritis  Ashby Dawes of Condition: Chronic (continuous duration > 3 months)   Laterality: Rt  Current Functional Measure Score: LEFS 20 / 80 = 25.0 %  Objective measurements identify impairments when they are compared to normal values, the uninvolved extremity, and prior level of function.  [x]  Yes  []  No  Objective assessment of functional ability: Moderate functional limitations   Briefly describe symptoms: Patient reports onset of R posterior lateral thigh pain beginning ~6-8 months ago without known MOI, however thinks he may recall having had a "charley horse" in his hamstring at some point.  He also notes ongoing intermittent R knee  pain since his R TKA in 2017.  He is unable to identify aggravating factors, stating pain is "just there", nor is he able to find anything that gives him significant relief from pain. Patient has deficits in B knee ROM, B proximal LE flexibility, BLE strength, abnormal posture and gait mechanics, and TTP with abnormal muscle tension in R  lateral hamstring which are interfering with ADLs and are impacting quality of life.  On LEFS patient scored 20/80 demonstrating 75% disability.  Gait speed significantly decreased at 0.93 ft/sec with SPC placing him at a household ambulator level with an elevated risk for recurrent falls, therefore we will plan for further standardized balance testing next visit.    How did symptoms start: uncertain, possibly muscle cramp  Average pain intensity:  Last 24 hours: 6/10  Past week: 6/10  How often does the pt experience symptoms? Constantly  How much have the symptoms interfered with usual daily activities? Quite a bit  How has condition changed since care began at this facility? NA - initial visit  In general, how is the patients overall health? Fair  Onset date: ~6-8 months   BACK PAIN (STarT Back Screening Tool) - (When applicable): N/A  Has your back pain spread down your leg(s) at sometime in the last 2 weeks? []  Yes   []  No Have you had pain in the shoulder or neck at sometime in the past 2 weeks? []  Yes   []  No Have you only walked short distances because of your back pain? []  Yes   []  No In the past 2 weeks, have you dressed more slowly than usual because of your back pain? []  Yes   []  No Do you think it is not really safe for person with a condition like yours to be physically active? []  Yes   []  No Have worrying thoughts been going through your mind a lot of the time? []  Yes   []  No Do you feel that your back pain is terrible and it is never going to get any better? []  Yes   []  No In general, have you stopped enjoying all the things you usually enjoy? []  Yes   []  No Overall, how bothersome has your back pain been in the last 2 weeks? []  Not at all   []  Slightly     []  Moderate   []  Very much     []  Extremely

## 2023-08-31 ENCOUNTER — Encounter: Payer: Self-pay | Admitting: Physical Therapy

## 2023-08-31 ENCOUNTER — Other Ambulatory Visit: Payer: Self-pay

## 2023-08-31 ENCOUNTER — Ambulatory Visit: Payer: Medicare Other | Attending: Medical | Admitting: Physical Therapy

## 2023-08-31 DIAGNOSIS — M62838 Other muscle spasm: Secondary | ICD-10-CM | POA: Insufficient documentation

## 2023-08-31 DIAGNOSIS — M6281 Muscle weakness (generalized): Secondary | ICD-10-CM | POA: Diagnosis present

## 2023-08-31 DIAGNOSIS — R2689 Other abnormalities of gait and mobility: Secondary | ICD-10-CM | POA: Insufficient documentation

## 2023-08-31 DIAGNOSIS — M79651 Pain in right thigh: Secondary | ICD-10-CM | POA: Insufficient documentation

## 2023-08-31 DIAGNOSIS — M25561 Pain in right knee: Secondary | ICD-10-CM | POA: Insufficient documentation

## 2023-08-31 DIAGNOSIS — G8929 Other chronic pain: Secondary | ICD-10-CM | POA: Diagnosis present

## 2023-08-31 NOTE — Addendum Note (Signed)
Addended by: Marry Guan on: 08/31/2023 07:37 PM   Modules accepted: Orders

## 2023-09-07 ENCOUNTER — Encounter: Payer: Self-pay | Admitting: Physical Therapy

## 2023-09-07 ENCOUNTER — Ambulatory Visit: Payer: Medicare Other | Admitting: Physical Therapy

## 2023-09-07 DIAGNOSIS — M79651 Pain in right thigh: Secondary | ICD-10-CM | POA: Diagnosis not present

## 2023-09-07 DIAGNOSIS — R2689 Other abnormalities of gait and mobility: Secondary | ICD-10-CM

## 2023-09-07 DIAGNOSIS — M62838 Other muscle spasm: Secondary | ICD-10-CM

## 2023-09-07 DIAGNOSIS — G8929 Other chronic pain: Secondary | ICD-10-CM

## 2023-09-07 DIAGNOSIS — M6281 Muscle weakness (generalized): Secondary | ICD-10-CM

## 2023-09-07 NOTE — Therapy (Addendum)
OUTPATIENT PHYSICAL THERAPY TREATMENT   Patient Name: Seth [name] MRN: [id number] DOB:[birth year], 83 y.o., male Today's Date: 09/07/2023   END OF SESSION:  PT End of Session - 09/07/23 1404     Visit Number 2    Date for PT Re-Evaluation 10/26/23    Authorization Type [employer]    Authorization Time Period 08/31/23 - 10/26/23    Authorization - Visit Number 2    Authorization - Number of Visits 6    Progress Note Due on Visit 10    PT Start Time 1404    PT Stop Time 1447    PT Time Calculation (min) 43 min    Activity Tolerance Patient tolerated treatment well    Behavior During Therapy [facility] for tasks assessed/performed              Past Medical History:  Diagnosis Date   Arthritis    Cancer (HCC)    hx of prostate cancer    History of kidney stones    Hypertension    Pneumonia    06/2015    Past Surgical History:  Procedure Laterality Date   PROSTATECTOMY     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   Procedure: RIGHT TOTAL KNEE ARTHROPLASTY;  Surgeon: Ollen Gross, MD;  Location: [facility];  Service: Orthopedics;  Laterality: Right;   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   Procedure: TOTAL KNEE ARTHROPLASTY;  Surgeon: Ollen Gross, MD;  Location: [facility];  Service: Orthopedics;  Laterality: Left;    Patient Active Problem List   Diagnosis Date Noted   Sepsis (HCC) 04/13/2020   Pneumonia due to COVID-19 virus 04/13/2020   Acute hypoxemic respiratory failure (HCC) 04/13/2020   Elevated troponin 04/13/2020   Syncope 08/30/2018   Essential hypertension 08/30/2018   AKI (acute kidney injury) (HCC) 08/30/2018   Hyponatremia 08/30/2018   Volume depletion 08/30/2018   OA (osteoarthritis) of knee 09/10/2015    PCP: Drosinis, [place] Reader, PA-C   REFERRING PROVIDER: Bill Salinas, PA-C   REFERRING DIAG: [phone] (ICD-10-CM) - Right knee pain / R LE hamstring strain (biceps femoris)  THERAPY DIAG:  Pain in right thigh  Chronic pain of right  knee  Muscle weakness (generalized)  Other muscle spasm  Other abnormalities of gait and mobility  RATIONALE FOR EVALUATION AND TREATMENT: Rehabilitation  ONSET DATE: ~6-8 months  NEXT MD VISIT: PRN   SUBJECTIVE:  SUBJECTIVE STATEMENT: Pt reports he tried the HEP and feels like he may have overdone one of them.  EVAL:  Pt reports pain in R posterior lateral thigh for the past 6-8 months w/o known MOI.  He states he feels like at one point in time he had a "charley horse" in his R thigh.  He notes constant pain at ~4/10.  He states his R knee acts up at times since his TKA.  PAIN: Are you having pain? Yes: NPRS scale: 0/10 Pain location: R posterior lateral thigh Pain description: sore Aggravating factors: "just there" Relieving factors: nothing  PERTINENT HISTORY:  L TKA 2020, R TKA 2017, OA, HTN, prostate cancer  PRECAUTIONS: Fall  RED FLAGS: None  WEIGHT BEARING RESTRICTIONS: No  FALLS:  Has patient fallen in last 6 months? No, but FOF  LIVING ENVIRONMENT: Lives with: lives with their family Lives in: House/apartment Stairs: Yes: Internal: 14 steps; to basement (no need to access) and External: 2 steps; on right going up, on left going up, and can reach both Has following equipment at home: Single point cane and Grab bars  OCCUPATION: Retired  PLOF: Independent and Leisure: mostly sedentary - look at [employer], work in the yard when the weather is nicer  PATIENT GOALS: "To use my R leg completely."   OBJECTIVE: (objective measures completed at initial evaluation unless otherwise dated)  DIAGNOSTIC FINDINGS:  07/30/23 - Peripheral venous US - R LE: IMPRESSION: No evidence of deep vein thrombosis.   PATIENT SURVEYS:  LEFS 20 / 80 = 25.0 %  COGNITION: Overall  cognitive status: Within functional limits for tasks assessed    SENSATION: WFL  EDEMA:  N/A  POSTURE:  rounded shoulders, forward head, flexed trunk , and genu valgum bilaterally  PALPATION: Increased muscle tension and TTP along R lateral HS  MUSCLE LENGTH: Hamstrings: mild/mod tight R>L ITB: mod tight L>R Piriformis: WFL Hip IR: mod tight R>L Hip flexors: mod tight R>L Quads: mod tight R>L  LOWER EXTREMITY ROM:  Active ROM Right eval Left eval  Hip flexion    Hip extension    Hip abduction    Hip adduction    Hip internal rotation    Hip external rotation    Knee flexion 104 110  Knee extension 0 0  Ankle dorsiflexion    Ankle plantarflexion    Ankle inversion    Ankle eversion     Passive ROM Right eval Left eval  Hip flexion    Hip extension    Hip abduction    Hip adduction    Hip internal rotation    Hip external rotation    Knee flexion 110 110  Knee extension    Ankle dorsiflexion    Ankle plantarflexion    Ankle inversion    Ankle eversion    (Blank rows = not tested)  LOWER EXTREMITY MMT:  MMT Right eval Left eval  Hip flexion 3+ 4  Hip extension 2+ 3-  Hip abduction 2+ 3+  Hip adduction 3- 4-  Hip internal rotation 3- 4-  Hip external rotation 4- 4  Knee flexion 4- 4+  Knee extension 4 4+  Ankle dorsiflexion 4- 4+  Ankle plantarflexion    Ankle inversion    Ankle eversion     (Blank rows = not tested)  FUNCTIONAL TESTS:  5 times sit to stand: 20.35 sec (w/o UE assist), >15 sec indicates recurrent fall risk Timed up and go (TUG): 21.94 sec with SPC, >13.5 sec indicates  high risk for falls 10 meter walk test: 35.09 sec with SPC, 0.93 ft/sec with SPC Berg Balance Scale: 37/56, 37-45 significant risk for falls (>80%)  Dynamic Gait Index: 7/24, Scores of 19 or less are predicitve of falls in older community living adults.  GAIT: Distance walked: Clinic distances Assistive device utilized: Single point cane Level of assistance:  Modified independence Gait pattern: decreased stride length, knee flexed in stance- Right, knee flexed in stance- Left, shuffling, trunk flexed, and narrow BOS Comments: Stooped posture with genu valgum during gait   TODAY'S TREATMENT:   09/07/2023 THERAPEUTIC ACTIVITIES: 5xSTS: 20.35 sec (w/o UE assist), >15 sec indicates recurrent fall risk TUG: 21.94 sec with SPC, >13.5 sec indicates high risk for falls Berg: 37/56, 37-45 significant risk for falls (>80%)  DGI: 7/24, Scores of 19 or less are predicitve of falls in older community living adults  GAIT TRAINING: To normalize gait pattern, improve safety with SPC, and better offload R LE .  120 ft with SPC - encouraged switch of SPC to L hand to better offload R LE Stairs: Level of Assistance: SBA and CGA Stair Negotiation Technique: Step to Pattern & Alternating Pattern  with Single Rail on Right Number of Stairs: 14  Height of Stairs: 7"   THERAPEUTIC EXERCISE: to improve flexibility, strength and mobility.  Demonstration, verbal and tactile cues throughout for technique. Seated R hamstring stretch with strap 2 x 30"  Seated R hamstring isometric 10 x 5"   08/31/2023 - Eval THERAPEUTIC EXERCISE: to improve flexibility, strength and mobility.  Demonstration, verbal and tactile cues throughout for technique.  Seated R hamstring stretch with strap 3 x 30" - latter 2 reps including hip ER and IR to isolate medial and lateral hamstrings Seated R hamstring isometric 10 x 5"   PATIENT EDUCATION:  Education details: PT eval findings, anticipated POC, HEP review, and fall risk concerns indicating potential need for more supportive AD   Person educated: Patient Education method: Explanation, Demonstration, and Verbal cues Education comprehension: verbalized understanding, returned demonstration, verbal cues required, and needs further education  HOME EXERCISE PROGRAM: Access Code: [email or web address] URL: [email or web address] Date:  08/31/2023 Prepared by: Glenetta Hew  Exercises - Seated Hamstring Stretch with Strap  - 2 x daily - 7 x weekly - 3 reps - 30 sec hold - Seated Hamstring Set  - 2 x daily - 7 x weekly - 2 sets - 10 reps - 3 sec hold   ASSESSMENT:  CLINICAL IMPRESSION: Standardized balance testing completed with all tests indicating a significant to high risk for falls.   5xSTS score of 20.35 sec was greater than the 15 sec threshold indicating risk for recurrent falls.  TUG time of 21.94 seconds with SPC was greater than 13.5 sec threshold indicating high fall risk.  Berg score of 37/56 falls indicates a significant (>80%) risk for falls.  DGI score was 7/24, with scores of 19 or less are predictive of falls in older community living adults.  Given above findings along with severely decreased gait speed of 0.93 ft/sec observed on initial eval visit, discussed potential use of 4WW, rollator or another style of RW for all ambulation including within home as well as community for increased safety.  Seth [name] reports he has used a walker in the past but "gets by" with just the [facility] recently.  Suggested he switch the [facility] to his L hand to help offload the R painful/antalgic HS, but will likely require further training to ensure safe sequencing of SPC.  Reviewed initial HEP, providing clarification of difference between stretch and isometric muscle activation.  Seth [name] will benefit from continued skilled PT to address ongoing deficits to improve mobility and activity tolerance with decreased pain interference.  OBJECTIVE IMPAIRMENTS: Abnormal gait, decreased activity tolerance, decreased balance, decreased endurance, decreased knowledge of condition, decreased knowledge of use of DME, decreased mobility, difficulty walking, decreased ROM, decreased strength, decreased safety awareness, hypomobility, increased fascial restrictions, impaired perceived functional ability, increased muscle spasms, impaired flexibility, improper body  mechanics, postural dysfunction, and pain.   ACTIVITY LIMITATIONS: carrying, lifting, bending, sitting, standing, squatting, sleeping, stairs, transfers, bed mobility, and locomotion level  PARTICIPATION LIMITATIONS: meal prep, cleaning, laundry, driving, shopping, community activity, and yard work  PERSONAL FACTORS: Age, Fitness, Past/current experiences, Time since onset of injury/illness/exacerbation, and 3+ comorbidities: L TKA 2020, R TKA 2017, OA, HTN, prostate cancer  are also affecting patient's functional outcome.   REHAB POTENTIAL: Good  CLINICAL DECISION MAKING: Evolving/moderate complexity  EVALUATION COMPLEXITY: Moderate   GOALS: Goals reviewed with patient? Yes  SHORT TERM GOALS: Target date: 09/28/2023  Patient will be independent with initial HEP. Baseline:  Goal status: IN PROGRESS  2.  Patient will report at least 25% improvement in R thigh and knee pain to improve QOL. Baseline: 4/10 constant pain, at worst up to 7/10 Goal status: IN PROGRESS  3.  Complete standardized balance testing and update POC/goals as indicated. Baseline:  Goal status: MET - 09/07/23  4.  Patient will improve 5x STS time to </= 15 seconds to demonstrate improved functional strength and transfer efficiency  Baseline: 20.35 sec Goal status: INITIAL   LONG TERM GOALS: Target date: 10/26/2023  Patient will be independent with advanced/ongoing HEP to improve outcomes and carryover.  Baseline:  Goal status: IN PROGRESS  2.  Patient will report at least 50-75% improvement in R thigh and knee pain to improve QOL. Baseline: 4/10 constant pain, at worst up to 7/10 Goal status: IN PROGRESS  3.  Patient will demonstrate improved R knee AROM to >/= 0-110 deg to allow for normal gait and stair mechanics. Baseline: R knee AROM 0-104 Goal status: IN PROGRESS  4.  Patient will demonstrate improved B LE strength to >/= 4 to 4+/5 for improved stability and ease of mobility. Baseline: Refer to  above LE MMT table Goal status: IN PROGRESS  5.  Patient will be able to ambulate with LRAD and normal gait pattern at gait speed of >/= 1.8 ft/sec without increased pain for safe limited community access with decreased risk for recurrent falls.  Baseline: 0.93 ft/sec Goal status: IN PROGRESS  6. Patient will be able to ascend/descend stairs with 1 HR and reciprocal step pattern safely to access home and community.  Baseline: Step-to/alternating pattern with SPC and 1 HR with CGA of PT  Goal status: IN PROGRESS  7.  Patient will report >/= 29/80 on LEFS (MCID = 9 pts) to demonstrate improved functional ability. Baseline: 20 / 80 = 25.0 % Goal status: IN PROGRESS  8.  Patient will demonstrate at least 15/24 on DGI to decrease risk of falls. Baseline: 7/24 Goal status: IN PROGRESS    PLAN:  PT FREQUENCY: 2x/week  PT DURATION: 8 weeks  PLANNED INTERVENTIONS: 97164- PT Re-evaluation, 97110-Therapeutic exercises, 97530- Therapeutic activity, 97112- Neuromuscular re-education, 97535- Self Care, 16109- Manual therapy, [id number]- Gait training, [phone]- Aquatic Therapy, 97014- Electrical stimulation (unattended), [id number]- Electrical stimulation (manual), [id number]- Vasopneumatic device, [id number]- Ultrasound, [id number]- Ionotophoresis 4mg /ml Dexamethasone, Patient/Family education, Balance training, Stair training, Taping,  Dry Needling, Joint mobilization, Scar mobilization, DME instructions, Cryotherapy, and Moist heat  PLAN FOR NEXT SESSION:  Progress proximal LE flexibility and strengthening - update HEP accordingly; MT +/- DN to address abnormal muscle tension in R lateral hamstrings   Marry Guan, PT 09/07/2023, 4:06 PM     Date of referral: 07/30/2023 Referring provider: Bill Salinas, PA-C Referring diagnosis? M25.561 (ICD-10-CM) - Right knee pain / R LE hamstring strain (biceps femoris) Treatment diagnosis? (if different than referring diagnosis)  Pain in right thigh  Chronic pain of  right knee  Muscle weakness (generalized)  Other muscle spasm  Other abnormalities of gait and mobility  What was this (referring dx) caused by? Ongoing Issue and Arthritis  Ashby Dawes of Condition: Chronic (continuous duration > 3 months)   Laterality: Rt  Current Functional Measure Score: LEFS 20 / 80 = 25.0 %  Objective measurements identify impairments when they are compared to normal values, the uninvolved extremity, and prior level of function.  [x]  Yes  []  No  Objective assessment of functional ability: Moderate functional limitations   Briefly describe symptoms: Patient reports onset of R posterior lateral thigh pain beginning ~6-8 months ago without known MOI, however thinks he may recall having had a "charley horse" in his hamstring at some point.  He also notes ongoing intermittent R knee pain since his R TKA in 2017.  He is unable to identify aggravating factors, stating pain is "just there", nor is he able to find anything that gives him significant relief from pain. Patient has deficits in B knee ROM, B proximal LE flexibility, BLE strength, abnormal posture and gait mechanics, and TTP with abnormal muscle tension in R lateral hamstring which are interfering with ADLs and are impacting quality of life.  On LEFS patient scored 20/80 demonstrating 75% disability.  Gait speed significantly decreased at 0.93 ft/sec with SPC placing him at a household ambulator level with an elevated risk for recurrent falls, therefore we will plan for further standardized balance testing next visit.    How did symptoms start: uncertain, possibly muscle cramp  Average pain intensity:  Last 24 hours: 6/10  Past week: 6/10  How often does the pt experience symptoms? Constantly  How much have the symptoms interfered with usual daily activities? Quite a bit  How has condition changed since care began at this facility? NA - initial visit  In general, how is the patients overall health?  Fair  Onset date: ~6-8 months   BACK PAIN (STarT Back Screening Tool) - (When applicable): N/A  Has your back pain spread down your leg(s) at sometime in the last 2 weeks? []  Yes   []  No Have you had pain in the shoulder or neck at sometime in the past 2 weeks? []  Yes   []  No Have you only walked short distances because of your back pain? []  Yes   []  No In the past 2 weeks, have you dressed more slowly than usual because of your back pain? []  Yes   []  No Do you think it is not really safe for person with a condition like yours to be physically active? []  Yes   []  No Have worrying thoughts been going through your mind a lot of the time? []  Yes   []  No Do you feel that your back pain is terrible and it is never going to get any better? []  Yes   []  No In general, have you stopped enjoying all the things you usually  enjoy? []  Yes   []  No Overall, how bothersome has your back pain been in the last 2 weeks? []  Not at all   []  Slightly     []  Moderate   []  Very much     []  Extremely

## 2023-09-14 ENCOUNTER — Ambulatory Visit: Payer: Medicare Other | Attending: Medical | Admitting: Physical Therapy

## 2023-09-14 ENCOUNTER — Encounter: Payer: Self-pay | Admitting: Physical Therapy

## 2023-09-14 DIAGNOSIS — R2689 Other abnormalities of gait and mobility: Secondary | ICD-10-CM | POA: Diagnosis present

## 2023-09-14 DIAGNOSIS — G8929 Other chronic pain: Secondary | ICD-10-CM | POA: Diagnosis present

## 2023-09-14 DIAGNOSIS — M79651 Pain in right thigh: Secondary | ICD-10-CM | POA: Insufficient documentation

## 2023-09-14 DIAGNOSIS — M62838 Other muscle spasm: Secondary | ICD-10-CM | POA: Insufficient documentation

## 2023-09-14 DIAGNOSIS — M6281 Muscle weakness (generalized): Secondary | ICD-10-CM | POA: Insufficient documentation

## 2023-09-14 DIAGNOSIS — M25561 Pain in right knee: Secondary | ICD-10-CM | POA: Insufficient documentation

## 2023-09-14 NOTE — Therapy (Signed)
OUTPATIENT PHYSICAL THERAPY TREATMENT   Patient Name: Seth [name] MRN: [id number] DOB:December 08, 1940, 83 y.o., male Today's Date: 09/14/2023   END OF SESSION:  Seth [name] End of Session - 09/14/23 1404     Visit Number 3    Date for Seth [name] Re-Evaluation 10/26/23    Authorization Type [employer]    Authorization Time Period 08/31/23 - 10/26/23    Authorization - Visit Number 3    Authorization - Number of Visits 6    Progress Note Due on Visit 10    Seth [name] Start Time 1404    Seth [name] Stop Time 1448    Seth [name] Time Calculation (min) 44 min    Activity Tolerance Patient tolerated treatment well    Behavior During Therapy [facility] for tasks assessed/performed               Past Medical History:  Diagnosis Date   Arthritis    Cancer (HCC)    hx of prostate cancer    History of kidney stones    Hypertension    Pneumonia    06/2015    Past Surgical History:  Procedure Laterality Date   PROSTATECTOMY     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   Procedure: RIGHT TOTAL KNEE ARTHROPLASTY;  Surgeon: Ollen Gross, MD;  Location: [facility];  Service: Orthopedics;  Laterality: Right;   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   Procedure: TOTAL KNEE ARTHROPLASTY;  Surgeon: Ollen Gross, MD;  Location: [facility];  Service: Orthopedics;  Laterality: Left;    Patient Active Problem List   Diagnosis Date Noted   Sepsis (HCC) 04/13/2020   Pneumonia due to COVID-19 virus 04/13/2020   Acute hypoxemic respiratory failure (HCC) 04/13/2020   Elevated troponin 04/13/2020   Syncope 08/30/2018   Essential hypertension 08/30/2018   AKI (acute kidney injury) (HCC) 08/30/2018   Hyponatremia 08/30/2018   Volume depletion 08/30/2018   OA (osteoarthritis) of knee 09/10/2015    PCP: Drosinis, Leonia Reader, PA-C   REFERRING PROVIDER: Bill Salinas, PA-C   REFERRING DIAG: [phone] (ICD-10-CM) - Right knee pain / R LE hamstring strain (biceps femoris)  THERAPY DIAG:  Pain in right thigh  Chronic pain of right  knee  Muscle weakness (generalized)  Other muscle spasm  Other abnormalities of gait and mobility  RATIONALE FOR EVALUATION AND TREATMENT: Rehabilitation  ONSET DATE: ~6-8 months  NEXT MD VISIT: PRN   SUBJECTIVE:  SUBJECTIVE STATEMENT: Seth [name] reports his R knee feels stiff today.  No "pain" in the thigh today but still a little sore in spots.  EVAL:  Seth [name] reports pain in R posterior lateral thigh for the past 6-8 months w/o known MOI.  He states he feels like at one point in time he had a "charley horse" in his R thigh.  He notes constant pain at ~4/10.  He states his R knee acts up at times since his TKA.  PAIN: Are you having pain? Yes: NPRS scale: 0/10 Pain location: R posterior lateral thigh Pain description: sore Aggravating factors: "just there" Relieving factors: nothing  PERTINENT HISTORY:  L TKA 2020, R TKA 2017, OA, HTN, prostate cancer  PRECAUTIONS: Fall  RED FLAGS: None  WEIGHT BEARING RESTRICTIONS: No  FALLS:  Has patient fallen in last 6 months? No, but FOF  LIVING ENVIRONMENT: Lives with: lives with their family Lives in: House/apartment Stairs: Yes: Internal: 14 steps; to basement (no need to access) and External: 2 steps; on right going up, on left going up, and can reach both Has following equipment at home: Single point cane and Grab bars  OCCUPATION: Retired  PLOF: Independent and Leisure: mostly sedentary - look at [employer], work in the yard when the weather is nicer  PATIENT GOALS: "To use my R leg completely."   OBJECTIVE: (objective measures completed at initial evaluation unless otherwise dated)  DIAGNOSTIC FINDINGS:  07/30/23 - Peripheral venous US - R LE: IMPRESSION: No evidence of deep vein thrombosis.   PATIENT SURVEYS:  LEFS 20 / 80 = 25.0  %  COGNITION: Overall cognitive status: Within functional limits for tasks assessed    SENSATION: WFL  EDEMA:  N/A  POSTURE:  rounded shoulders, forward head, flexed trunk , and genu valgum bilaterally  PALPATION: Increased muscle tension and TTP along R lateral HS  MUSCLE LENGTH: Hamstrings: mild/mod tight R>L ITB: mod tight L>R Piriformis: WFL Hip IR: mod tight R>L Hip flexors: mod tight R>L Quads: mod tight R>L  LOWER EXTREMITY ROM:  Active ROM Right eval Left eval  Hip flexion    Hip extension    Hip abduction    Hip adduction    Hip internal rotation    Hip external rotation    Knee flexion 104 110  Knee extension 0 0  Ankle dorsiflexion    Ankle plantarflexion    Ankle inversion    Ankle eversion     Passive ROM Right eval Left eval  Hip flexion    Hip extension    Hip abduction    Hip adduction    Hip internal rotation    Hip external rotation    Knee flexion 110 110  Knee extension    Ankle dorsiflexion    Ankle plantarflexion    Ankle inversion    Ankle eversion    (Blank rows = not tested)  LOWER EXTREMITY MMT:  MMT Right eval Left eval  Hip flexion 3+ 4  Hip extension 2+ 3-  Hip abduction 2+ 3+  Hip adduction 3- 4-  Hip internal rotation 3- 4-  Hip external rotation 4- 4  Knee flexion 4- 4+  Knee extension 4 4+  Ankle dorsiflexion 4- 4+  Ankle plantarflexion    Ankle inversion    Ankle eversion     (Blank rows = not tested)  FUNCTIONAL TESTS:  5 times sit to stand: 20.35 sec (w/o UE assist), >15 sec indicates recurrent fall risk Timed up and go (TUG): 21.94  sec with SPC, >13.5 sec indicates high risk for falls 10 meter walk test: 35.09 sec with SPC, 0.93 ft/sec with SPC Berg Balance Scale: 37/56, 37-45 significant risk for falls (>80%)  Dynamic Gait Index: 7/24, Scores of 19 or less are predicitve of falls in older community living adults.  GAIT: Distance walked: Clinic distances Assistive device utilized: Single point  cane Level of assistance: Modified independence Gait pattern: decreased stride length, knee flexed in stance- Right, knee flexed in stance- Left, shuffling, trunk flexed, and narrow BOS Comments: Stooped posture with genu valgum during gait   TODAY'S TREATMENT:   09/14/23 THERAPEUTIC EXERCISE: to improve flexibility, ROM and strength.  Demonstration, verbal and tactile cues throughout for technique.  Rec Bike - L 1 x 3 min - feet kept slipping off pedals NuStep - L5 x 5 min (B UE/LE) for muscle warm-up to promote tissue perfusion Seated TrA + hip ADD ball squeeze 10 x 5" Seated RTB alt hip ABD/ER 10 x 3-5", 2 sets Seated RTB hip flexion slow march 10 x 3", 2 sets Seated knee extension with RTB at ankles 2 x 10 Seated knee flexion with RTB at ankles 2 x 10  SELF CARE: Provided guidance and alternatives for use of ball (folded pillow or rolled towel/blanket) for HEP update as well as ensured patient could don and doff TheraBand without need for assistance (using cane to help transition band on and off feet).   09/07/2023 THERAPEUTIC ACTIVITIES: 5xSTS: 20.35 sec (w/o UE assist), >15 sec indicates recurrent fall risk TUG: 21.94 sec with SPC, >13.5 sec indicates high risk for falls Berg: 37/56, 37-45 significant risk for falls (>80%)  DGI: 7/24, Scores of 19 or less are predicitve of falls in older community living adults  GAIT TRAINING: To normalize gait pattern, improve safety with SPC, and better offload R LE .  120 ft with SPC - encouraged switch of SPC to L hand to better offload R LE Stairs: Level of Assistance: SBA and CGA Stair Negotiation Technique: Step to Pattern & Alternating Pattern  with Single Rail on Right Number of Stairs: 14  Height of Stairs: 7"   THERAPEUTIC EXERCISE: to improve flexibility, strength and mobility.  Demonstration, verbal and tactile cues throughout for technique. Seated R hamstring stretch with strap 2 x 30"  Seated R hamstring isometric 10 x  5"   08/31/2023 - Eval THERAPEUTIC EXERCISE: to improve flexibility, strength and mobility.  Demonstration, verbal and tactile cues throughout for technique.  Seated R hamstring stretch with strap 3 x 30" - latter 2 reps including hip ER and IR to isolate medial and lateral hamstrings Seated R hamstring isometric 10 x 5"   PATIENT EDUCATION:  Education details: HEP progression  Person educated: Patient Education method: Programmer, multimedia, Demonstration, Verbal cues, and Handouts Education comprehension: verbalized understanding, returned demonstration, verbal cues required, and needs further education  HOME EXERCISE PROGRAM: Access Code: [email or web address] URL: [email or web address] Date: 09/14/2023 Prepared by: Glenetta Hew  Exercises - Seated Hamstring Stretch with Strap  - 2 x daily - 7 x weekly - 3 reps - 30 sec hold - Seated Hamstring Set  - 2 x daily - 7 x weekly - 2 sets - 10 reps - 3 sec hold - Seated Hip Adduction Isometrics with Ball  - 1 x daily - 7 x weekly - 2 sets - 10 reps - 3-5 sec hold - Seated Isometric Hip Abduction with Resistance  - 1 x daily - 7 x weekly - 2 sets - 10  reps - 3 sec hold - Seated March with Resistance  - 1 x daily - 7 x weekly - 2 sets - 10 reps - 3 sec hold - Seated Hamstring Curls with Resistance  - 1 x daily - 7 x weekly - 2 sets - 10 reps - 3 sec hold - Seated Knee Extension with Resistance  - 1 x daily - 7 x weekly - 2 sets - 10 reps - 3 sec hold   ASSESSMENT:  CLINICAL IMPRESSION: Attempted rec bike for warm-up however patient's feet slipping off pedals, therefore switched to NuStep. Progressed strengthening to include proximal LE strengthening - Seth [name] noting increased effort on R LE, therefore isolated R vs L LE when possible with strengthening activities.  Exercises performed in sitting to better isolate desired muscle activation for strengthening emphasis.  HEP updated to reflect exercise progression.  Kino will benefit from continued skilled  Seth [name] to address ongoing deficits to improve mobility and activity tolerance with decreased pain interference.  OBJECTIVE IMPAIRMENTS: Abnormal gait, decreased activity tolerance, decreased balance, decreased endurance, decreased knowledge of condition, decreased knowledge of use of DME, decreased mobility, difficulty walking, decreased ROM, decreased strength, decreased safety awareness, hypomobility, increased fascial restrictions, impaired perceived functional ability, increased muscle spasms, impaired flexibility, improper body mechanics, postural dysfunction, and pain.   ACTIVITY LIMITATIONS: carrying, lifting, bending, sitting, standing, squatting, sleeping, stairs, transfers, bed mobility, and locomotion level  PARTICIPATION LIMITATIONS: meal prep, cleaning, laundry, driving, shopping, community activity, and yard work  PERSONAL FACTORS: Age, Fitness, Past/current experiences, Time since onset of injury/illness/exacerbation, and 3+ comorbidities: L TKA 2020, R TKA 2017, OA, HTN, prostate cancer  are also affecting patient's functional outcome.   REHAB POTENTIAL: Good  CLINICAL DECISION MAKING: Evolving/moderate complexity  EVALUATION COMPLEXITY: Moderate   GOALS: Goals reviewed with patient? Yes  SHORT TERM GOALS: Target date: 09/28/2023  Patient will be independent with initial HEP. Baseline:  Goal status: IN PROGRESS  2.  Patient will report at least 25% improvement in R thigh and knee pain to improve QOL. Baseline: 4/10 constant pain, at worst up to 7/10 Goal status: IN PROGRESS  3.  Complete standardized balance testing and update POC/goals as indicated. Baseline:  Goal status: MET - 09/07/23  4.  Patient will improve 5x STS time to </= 15 seconds to demonstrate improved functional strength and transfer efficiency  Baseline: 20.35 sec Goal status: IN PROGRESS   LONG TERM GOALS: Target date: 10/26/2023  Patient will be independent with advanced/ongoing HEP to improve  outcomes and carryover.  Baseline:  Goal status: IN PROGRESS  2.  Patient will report at least 50-75% improvement in R thigh and knee pain to improve QOL. Baseline: 4/10 constant pain, at worst up to 7/10 Goal status: IN PROGRESS  3.  Patient will demonstrate improved R knee AROM to >/= 0-110 deg to allow for normal gait and stair mechanics. Baseline: R knee AROM 0-104 Goal status: IN PROGRESS  4.  Patient will demonstrate improved B LE strength to >/= 4 to 4+/5 for improved stability and ease of mobility. Baseline: Refer to above LE MMT table Goal status: IN PROGRESS  5.  Patient will be able to ambulate with LRAD and normal gait pattern at gait speed of >/= 1.8 ft/sec without increased pain for safe limited community access with decreased risk for recurrent falls.  Baseline: 0.93 ft/sec Goal status: IN PROGRESS  6. Patient will be able to ascend/descend stairs with 1 HR and reciprocal step pattern safely to access home  and community.  Baseline: Step-to/alternating pattern with SPC and 1 HR with CGA of Seth [name]  Goal status: IN PROGRESS  7.  Patient will report >/= 29/80 on LEFS (MCID = 9 pts) to demonstrate improved functional ability. Baseline: 20 / 80 = 25.0 % Goal status: IN PROGRESS  8.  Patient will demonstrate at least 15/24 on DGI to decrease risk of falls. Baseline: 7/24 Goal status: IN PROGRESS    PLAN:  Seth [name] FREQUENCY: 2x/week  Seth [name] DURATION: 8 weeks  PLANNED INTERVENTIONS: 97164- Seth [name] Re-evaluation, 97110-Therapeutic exercises, 97530- Therapeutic activity, 97112- Neuromuscular re-education, 97535- Self Care, [id number]- Manual therapy, [id number]- Gait training, [id number]- Aquatic Therapy, 97014- Electrical stimulation (unattended), [phone]- Electrical stimulation (manual), 97016- Vasopneumatic device, [id number]- Ultrasound, [id number]- Ionotophoresis 4mg /ml Dexamethasone, Patient/Family education, Balance training, Stair training, Taping, Dry Needling, Joint mobilization, Scar mobilization, DME  instructions, Cryotherapy, and Moist heat  PLAN FOR NEXT SESSION: Progress proximal LE flexibility and strengthening - update HEP accordingly; MT +/- DN to address abnormal muscle tension in R lateral hamstrings   Marry Guan, Seth [name] 09/14/2023, 3:00 PM     Date of referral: 07/30/2023 Referring provider: Bill Salinas, PA-C Referring diagnosis? M25.561 (ICD-10-CM) - Right knee pain / R LE hamstring strain (biceps femoris) Treatment diagnosis? (if different than referring diagnosis)  Pain in right thigh  Chronic pain of right knee  Muscle weakness (generalized)  Other muscle spasm  Other abnormalities of gait and mobility  What was this (referring dx) caused by? Ongoing Issue and Arthritis  Ashby Dawes of Condition: Chronic (continuous duration > 3 months)   Laterality: Rt  Current Functional Measure Score: LEFS 20 / 80 = 25.0 %  Objective measurements identify impairments when they are compared to normal values, the uninvolved extremity, and prior level of function.  [x]  Yes  []  No  Objective assessment of functional ability: Moderate functional limitations   Briefly describe symptoms: Patient reports onset of R posterior lateral thigh pain beginning ~6-8 months ago without known MOI, however thinks he may recall having had a "charley horse" in his hamstring at some point.  He also notes ongoing intermittent R knee pain since his R TKA in 2017.  He is unable to identify aggravating factors, stating pain is "just there", nor is he able to find anything that gives him significant relief from pain. Patient has deficits in B knee ROM, B proximal LE flexibility, BLE strength, abnormal posture and gait mechanics, and TTP with abnormal muscle tension in R lateral hamstring which are interfering with ADLs and are impacting quality of life.  On LEFS patient scored 20/80 demonstrating 75% disability.  Gait speed significantly decreased at 0.93 ft/sec with SPC placing him at a household  ambulator level with an elevated risk for recurrent falls, therefore we will plan for further standardized balance testing next visit.    How did symptoms start: uncertain, possibly muscle cramp  Average pain intensity:  Last 24 hours: 6/10  Past week: 6/10  How often does the Seth [name] experience symptoms? Constantly  How much have the symptoms interfered with usual daily activities? Quite a bit  How has condition changed since care began at this facility? NA - initial visit  In general, how is the patients overall health? Fair  Onset date: ~6-8 months   BACK PAIN (STarT Back Screening Tool) - (When applicable): N/A  Has your back pain spread down your leg(s) at sometime in the last 2 weeks? []  Yes   []  No Have you had pain in the shoulder  or neck at sometime in the past 2 weeks? []  Yes   []  No Have you only walked short distances because of your back pain? []  Yes   []  No In the past 2 weeks, have you dressed more slowly than usual because of your back pain? []  Yes   []  No Do you think it is not really safe for person with a condition like yours to be physically active? []  Yes   []  No Have worrying thoughts been going through your mind a lot of the time? []  Yes   []  No Do you feel that your back pain is terrible and it is never going to get any better? []  Yes   []  No In general, have you stopped enjoying all the things you usually enjoy? []  Yes   []  No Overall, how bothersome has your back pain been in the last 2 weeks? []  Not at all   []  Slightly     []  Moderate   []  Very much     []  Extremely

## 2023-09-18 ENCOUNTER — Ambulatory Visit: Payer: Medicare Other

## 2023-09-18 DIAGNOSIS — M79651 Pain in right thigh: Secondary | ICD-10-CM | POA: Diagnosis not present

## 2023-09-18 DIAGNOSIS — R2689 Other abnormalities of gait and mobility: Secondary | ICD-10-CM

## 2023-09-18 DIAGNOSIS — G8929 Other chronic pain: Secondary | ICD-10-CM

## 2023-09-18 DIAGNOSIS — M62838 Other muscle spasm: Secondary | ICD-10-CM

## 2023-09-18 DIAGNOSIS — M6281 Muscle weakness (generalized): Secondary | ICD-10-CM

## 2023-09-18 NOTE — Therapy (Signed)
 OUTPATIENT PHYSICAL THERAPY TREATMENT   Patient Name: Seth [name] MRN: [id number] DOB:May 30, 1941, 83 y.o., male Today's Date: 09/18/2023   END OF SESSION:  PT End of Session - 09/18/23 1204     Visit Number 4    Date for PT Re-Evaluation 10/26/23    Authorization Type [employer]    Authorization Time Period 08/31/23 - 10/26/23    Authorization - Visit Number 4    Authorization - Number of Visits 6    Progress Note Due on Visit 10    PT Start Time 1106   pt late   PT Stop Time 1146    PT Time Calculation (min) 40 min    Activity Tolerance Patient tolerated treatment well    Behavior During Therapy [facility] for tasks assessed/performed                Past Medical History:  Diagnosis Date   Arthritis    Cancer (HCC)    hx of prostate cancer    History of kidney stones    Hypertension    Pneumonia    06/2015    Past Surgical History:  Procedure Laterality Date   PROSTATECTOMY     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   Procedure: RIGHT TOTAL KNEE ARTHROPLASTY;  Surgeon: Dempsey Moan, MD;  Location: [facility];  Service: Orthopedics;  Laterality: Right;   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   Procedure: TOTAL KNEE ARTHROPLASTY;  Surgeon: Moan Dempsey, MD;  Location: [facility];  Service: Orthopedics;  Laterality: Left;    Patient Active Problem List   Diagnosis Date Noted   Sepsis (HCC) 04/13/2020   Pneumonia due to COVID-19 virus 04/13/2020   Acute hypoxemic respiratory failure (HCC) 04/13/2020   Elevated troponin 04/13/2020   Syncope 08/30/2018   Essential hypertension 08/30/2018   AKI (acute kidney injury) (HCC) 08/30/2018   Hyponatremia 08/30/2018   Volume depletion 08/30/2018   OA (osteoarthritis) of knee 09/10/2015    PCP: Drosinis, Arland PARAS, PA-C   REFERRING PROVIDER: Donah Penne LABOR, PA-C   REFERRING DIAG: [phone] (ICD-10-CM) - Right knee pain / R LE hamstring strain (biceps femoris)  THERAPY DIAG:  Pain in right thigh  Chronic pain of  right knee  Muscle weakness (generalized)  Other muscle spasm  Other abnormalities of gait and mobility  RATIONALE FOR EVALUATION AND TREATMENT: Rehabilitation  ONSET DATE: ~6-8 months  NEXT MD VISIT: PRN   SUBJECTIVE:  SUBJECTIVE STATEMENT: Pt reports his R knee feels stiff today. , always some pain in the R posterior thigh  EVAL:  Pt reports pain in R posterior lateral thigh for the past 6-8 months w/o known MOI.  He states he feels like at one point in time he had a charley horse in his R thigh.  He notes constant pain at ~4/10.  He states his R knee acts up at times since his TKA.  PAIN: Are you having pain? Yes: NPRS scale: 410 Pain location: R posterior lateral thigh Pain description: sore Aggravating factors: just there Relieving factors: nothing  PERTINENT HISTORY:  L TKA 2020, R TKA 2017, OA, HTN, prostate cancer  PRECAUTIONS: Fall  RED FLAGS: None  WEIGHT BEARING RESTRICTIONS: No  FALLS:  Has patient fallen in last 6 months? No, but FOF  LIVING ENVIRONMENT: Lives with: lives with their family Lives in: House/apartment Stairs: Yes: Internal: 14 steps; to basement (no need to access) and External: 2 steps; on right going up, on left going up, and can reach both Has following equipment at home: Single point cane and Grab bars  OCCUPATION: Retired  PLOF: Independent and Leisure: mostly sedentary - look at [employer], work in the yard when the weather is nicer  PATIENT GOALS: To use my R leg completely.   OBJECTIVE: (objective measures completed at initial evaluation unless otherwise dated)  DIAGNOSTIC FINDINGS:  07/30/23 - Peripheral venous US  - R LE: IMPRESSION: No evidence of deep vein thrombosis.   PATIENT SURVEYS:  LEFS 20 / 80 = 25.0  %  COGNITION: Overall cognitive status: Within functional limits for tasks assessed    SENSATION: WFL  EDEMA:  N/A  POSTURE:  rounded shoulders, forward head, flexed trunk , and genu valgum bilaterally  PALPATION: Increased muscle tension and TTP along R lateral HS  MUSCLE LENGTH: Hamstrings: mild/mod tight R>L ITB: mod tight L>R Piriformis: WFL Hip IR: mod tight R>L Hip flexors: mod tight R>L Quads: mod tight R>L  LOWER EXTREMITY ROM:  Active ROM Right eval Left eval  Hip flexion    Hip extension    Hip abduction    Hip adduction    Hip internal rotation    Hip external rotation    Knee flexion 104 110  Knee extension 0 0  Ankle dorsiflexion    Ankle plantarflexion    Ankle inversion    Ankle eversion     Passive ROM Right eval Left eval  Hip flexion    Hip extension    Hip abduction    Hip adduction    Hip internal rotation    Hip external rotation    Knee flexion 110 110  Knee extension    Ankle dorsiflexion    Ankle plantarflexion    Ankle inversion    Ankle eversion    (Blank rows = not tested)  LOWER EXTREMITY MMT:  MMT Right eval Left eval  Hip flexion 3+ 4  Hip extension 2+ 3-  Hip abduction 2+ 3+  Hip adduction 3- 4-  Hip internal rotation 3- 4-  Hip external rotation 4- 4  Knee flexion 4- 4+  Knee extension 4 4+  Ankle dorsiflexion 4- 4+  Ankle plantarflexion    Ankle inversion    Ankle eversion     (Blank rows = not tested)  FUNCTIONAL TESTS:  5 times sit to stand: 20.35 sec (w/o UE assist), >15 sec indicates recurrent fall risk Timed up and go (TUG): 21.94 sec with SPC, >13.5 sec  indicates high risk for falls 10 meter walk test: 35.09 sec with SPC, 0.93 ft/sec with SPC Berg Balance Scale: 37/56, 37-45 significant risk for falls (>80%)  Dynamic Gait Index: 7/24, Scores of 19 or less are predicitve of falls in older community living adults.  GAIT: Distance walked: Clinic distances Assistive device utilized: Single point  cane Level of assistance: Modified independence Gait pattern: decreased stride length, knee flexed in stance- Right, knee flexed in stance- Left, shuffling, trunk flexed, and narrow BOS Comments: Stooped posture with genu valgum during gait   TODAY'S TREATMENT:  09/18/23 THERAPEUTIC EXERCISE: to improve flexibility, ROM and strength.  Demonstration, verbal and tactile cues throughout for technique.  Seated ball squeeze x 10 Seated marching 2# x 10 BLE Seated LAQ 2# x 10 BLE Standing hip abduction x 10 BLE Standing hip extension x 10 BLE- postural cues needed Standing weight shifting 4 ways no UE support x 10 each- postural cues needed Seated HS curls RTB x 10 BLE- cues for full ROM Seated hamstring stretch with strap x 30 many cues needed to avoid knee flexion 09/14/23 THERAPEUTIC EXERCISE: to improve flexibility, ROM and strength.  Demonstration, verbal and tactile cues throughout for technique.  Rec Bike - L 1 x 3 min - feet kept slipping off pedals NuStep - L5 x 5 min (B UE/LE) for muscle warm-up to promote tissue perfusion Seated TrA + hip ADD ball squeeze 10 x 5 Seated RTB alt hip ABD/ER 10 x 3-5, 2 sets Seated RTB hip flexion slow march 10 x 3, 2 sets Seated knee extension with RTB at ankles 2 x 10 Seated knee flexion with RTB at ankles 2 x 10  SELF CARE: Provided guidance and alternatives for use of ball (folded pillow or rolled towel/blanket) for HEP update as well as ensured patient could don and doff TheraBand without need for assistance (using cane to help transition band on and off feet).   09/07/2023 THERAPEUTIC ACTIVITIES: 5xSTS: 20.35 sec (w/o UE assist), >15 sec indicates recurrent fall risk TUG: 21.94 sec with SPC, >13.5 sec indicates high risk for falls Berg: 37/56, 37-45 significant risk for falls (>80%)  DGI: 7/24, Scores of 19 or less are predicitve of falls in older community living adults  GAIT TRAINING: To normalize gait pattern, improve safety with SPC,  and better offload R LE .  120 ft with SPC - encouraged switch of SPC to L hand to better offload R LE Stairs: Level of Assistance: SBA and CGA Stair Negotiation Technique: Step to Pattern & Alternating Pattern  with Single Rail on Right Number of Stairs: 14  Height of Stairs: 7   THERAPEUTIC EXERCISE: to improve flexibility, strength and mobility.  Demonstration, verbal and tactile cues throughout for technique. Seated R hamstring stretch with strap 2 x 30  Seated R hamstring isometric 10 x 5   08/31/2023 - Eval THERAPEUTIC EXERCISE: to improve flexibility, strength and mobility.  Demonstration, verbal and tactile cues throughout for technique.  Seated R hamstring stretch with strap 3 x 30 - latter 2 reps including hip ER and IR to isolate medial and lateral hamstrings Seated R hamstring isometric 10 x 5   PATIENT EDUCATION:  Education details: HEP progression  Person educated: Patient Education method: Programmer, Multimedia, Demonstration, Verbal cues, and Handouts Education comprehension: verbalized understanding, returned demonstration, verbal cues required, and needs further education  HOME EXERCISE PROGRAM: Access Code: [email or web address] URL: [email or web address] Date: 09/14/2023 Prepared by: Elijah Hidden  Exercises - Seated Hamstring Stretch with Strap  -  2 x daily - 7 x weekly - 3 reps - 30 sec hold - Seated Hamstring Set  - 2 x daily - 7 x weekly - 2 sets - 10 reps - 3 sec hold - Seated Hip Adduction Isometrics with Ball  - 1 x daily - 7 x weekly - 2 sets - 10 reps - 3-5 sec hold - Seated Isometric Hip Abduction with Resistance  - 1 x daily - 7 x weekly - 2 sets - 10 reps - 3 sec hold - Seated March with Resistance  - 1 x daily - 7 x weekly - 2 sets - 10 reps - 3 sec hold - Seated Hamstring Curls with Resistance  - 1 x daily - 7 x weekly - 2 sets - 10 reps - 3 sec hold - Seated Knee Extension with Resistance  - 1 x daily - 7 x weekly - 2 sets - 10 reps - 3 sec  hold   ASSESSMENT:  CLINICAL IMPRESSION: Pt still unable to do recumbent bike d/t feet slipping out pedals. We worked on gentle strengthening for BLE adding more weight bearing as well. Many cues required with interventions as specified under treatment above. Many cues required with new exercises, so did not add any standing ex to HEP but may be ready for progression next visit. [place] will benefit from continued skilled PT to address ongoing deficits to improve mobility and activity tolerance with decreased pain interference.  OBJECTIVE IMPAIRMENTS: Abnormal gait, decreased activity tolerance, decreased balance, decreased endurance, decreased knowledge of condition, decreased knowledge of use of DME, decreased mobility, difficulty walking, decreased ROM, decreased strength, decreased safety awareness, hypomobility, increased fascial restrictions, impaired perceived functional ability, increased muscle spasms, impaired flexibility, improper body mechanics, postural dysfunction, and pain.   ACTIVITY LIMITATIONS: carrying, lifting, bending, sitting, standing, squatting, sleeping, stairs, transfers, bed mobility, and locomotion level  PARTICIPATION LIMITATIONS: meal prep, cleaning, laundry, driving, shopping, community activity, and yard work  PERSONAL FACTORS: Age, Fitness, Past/current experiences, Time since onset of injury/illness/exacerbation, and 3+ comorbidities: L TKA 2020, R TKA 2017, OA, HTN, prostate cancer  are also affecting patient's functional outcome.   REHAB POTENTIAL: Good  CLINICAL DECISION MAKING: Evolving/moderate complexity  EVALUATION COMPLEXITY: Moderate   GOALS: Goals reviewed with patient? Yes  SHORT TERM GOALS: Target date: 09/28/2023  Patient will be independent with initial HEP. Baseline:  Goal status: IN PROGRESS  2.  Patient will report at least 25% improvement in R thigh and knee pain to improve QOL. Baseline: 4/10 constant pain, at worst up to 7/10 Goal  status: IN PROGRESS  3.  Complete standardized balance testing and update POC/goals as indicated. Baseline:  Goal status: MET - 09/07/23  4.  Patient will improve 5x STS time to </= 15 seconds to demonstrate improved functional strength and transfer efficiency  Baseline: 20.35 sec Goal status: IN PROGRESS   LONG TERM GOALS: Target date: 10/26/2023  Patient will be independent with advanced/ongoing HEP to improve outcomes and carryover.  Baseline:  Goal status: IN PROGRESS  2.  Patient will report at least 50-75% improvement in R thigh and knee pain to improve QOL. Baseline: 4/10 constant pain, at worst up to 7/10 Goal status: IN PROGRESS  3.  Patient will demonstrate improved R knee AROM to >/= 0-110 deg to allow for normal gait and stair mechanics. Baseline: R knee AROM 0-104 Goal status: IN PROGRESS  4.  Patient will demonstrate improved B LE strength to >/= 4 to 4+/5 for improved stability  and ease of mobility. Baseline: Refer to above LE MMT table Goal status: IN PROGRESS  5.  Patient will be able to ambulate with LRAD and normal gait pattern at gait speed of >/= 1.8 ft/sec without increased pain for safe limited community access with decreased risk for recurrent falls.  Baseline: 0.93 ft/sec Goal status: IN PROGRESS  6. Patient will be able to ascend/descend stairs with 1 HR and reciprocal step pattern safely to access home and community.  Baseline: Step-to/alternating pattern with SPC and 1 HR with CGA of PT  Goal status: IN PROGRESS  7.  Patient will report >/= 29/80 on LEFS (MCID = 9 pts) to demonstrate improved functional ability. Baseline: 20 / 80 = 25.0 % Goal status: IN PROGRESS  8.  Patient will demonstrate at least 15/24 on DGI to decrease risk of falls. Baseline: 7/24 Goal status: IN PROGRESS    PLAN:  PT FREQUENCY: 2x/week  PT DURATION: 8 weeks  PLANNED INTERVENTIONS: 97164- PT Re-evaluation, 97110-Therapeutic exercises, 97530- Therapeutic activity,  97112- Neuromuscular re-education, 97535- Self Care, [id number]- Manual therapy, [id number]- Gait training, [phone]- Aquatic Therapy, 97014- Electrical stimulation (unattended), [phone]- Electrical stimulation (manual), 97016- Vasopneumatic device, [id number]- Ultrasound, 02966- Ionotophoresis 4mg /ml Dexamethasone , Patient/Family education, Balance training, Stair training, Taping, Dry Needling, Joint mobilization, Scar mobilization, DME instructions, Cryotherapy, and Moist heat  PLAN FOR NEXT SESSION: Progress proximal LE flexibility and strengthening - update HEP accordingly; MT +/- DN to address abnormal muscle tension in R lateral hamstrings   Sol LITTIE Gaskins, PTA 09/18/2023, 12:05 PM     Date of referral: 07/30/2023 Referring provider: Donah Penne LABOR, PA-C Referring diagnosis? M25.561 (ICD-10-CM) - Right knee pain / R LE hamstring strain (biceps femoris) Treatment diagnosis? (if different than referring diagnosis)  Pain in right thigh  Chronic pain of right knee  Muscle weakness (generalized)  Other muscle spasm  Other abnormalities of gait and mobility  What was this (referring dx) caused by? Ongoing Issue and Arthritis  Lysle of Condition: Chronic (continuous duration > 3 months)   Laterality: Rt  Current Functional Measure Score: LEFS 20 / 80 = 25.0 %  Objective measurements identify impairments when they are compared to normal values, the uninvolved extremity, and prior level of function.  [x]  Yes  []  No  Objective assessment of functional ability: Moderate functional limitations   Briefly describe symptoms: Patient reports onset of R posterior lateral thigh pain beginning ~6-8 months ago without known MOI, however thinks he may recall having had a charley horse in his hamstring at some point.  He also notes ongoing intermittent R knee pain since his R TKA in 2017.  He is unable to identify aggravating factors, stating pain is just there, nor is he able to find anything that gives  him significant relief from pain. Patient has deficits in B knee ROM, B proximal LE flexibility, BLE strength, abnormal posture and gait mechanics, and TTP with abnormal muscle tension in R lateral hamstring which are interfering with ADLs and are impacting quality of life.  On LEFS patient scored 20/80 demonstrating 75% disability.  Gait speed significantly decreased at 0.93 ft/sec with SPC placing him at a household ambulator level with an elevated risk for recurrent falls, therefore we will plan for further standardized balance testing next visit.    How did symptoms start: uncertain, possibly muscle cramp  Average pain intensity:  Last 24 hours: 6/10  Past week: 6/10  How often does the pt experience symptoms? Constantly  How much have the symptoms  interfered with usual daily activities? Quite a bit  How has condition changed since care began at this facility? NA - initial visit  In general, how is the patients overall health? Fair  Onset date: ~6-8 months   BACK PAIN (STarT Back Screening Tool) - (When applicable): N/A  Has your back pain spread down your leg(s) at sometime in the last 2 weeks? []  Yes   []  No Have you had pain in the shoulder or neck at sometime in the past 2 weeks? []  Yes   []  No Have you only walked short distances because of your back pain? []  Yes   []  No In the past 2 weeks, have you dressed more slowly than usual because of your back pain? []  Yes   []  No Do you think it is not really safe for person with a condition like yours to be physically active? []  Yes   []  No Have worrying thoughts been going through your mind a lot of the time? []  Yes   []  No Do you feel that your back pain is terrible and it is never going to get any better? []  Yes   []  No In general, have you stopped enjoying all the things you usually enjoy? []  Yes   []  No Overall, how bothersome has your back pain been in the last 2 weeks? []  Not at all   []  Slightly     []  Moderate   []   Very much     []  Extremely

## 2023-09-21 ENCOUNTER — Ambulatory Visit: Payer: Medicare Other | Admitting: Physical Therapy

## 2023-09-21 ENCOUNTER — Encounter: Payer: Self-pay | Admitting: Physical Therapy

## 2023-09-21 DIAGNOSIS — M6281 Muscle weakness (generalized): Secondary | ICD-10-CM

## 2023-09-21 DIAGNOSIS — M79651 Pain in right thigh: Secondary | ICD-10-CM | POA: Diagnosis not present

## 2023-09-21 DIAGNOSIS — M62838 Other muscle spasm: Secondary | ICD-10-CM

## 2023-09-21 DIAGNOSIS — R2689 Other abnormalities of gait and mobility: Secondary | ICD-10-CM

## 2023-09-21 DIAGNOSIS — G8929 Other chronic pain: Secondary | ICD-10-CM

## 2023-09-21 NOTE — Therapy (Signed)
 OUTPATIENT PHYSICAL THERAPY TREATMENT   Patient Name: Seth [name] MRN: [id number] DOB:11-Mar-1941, 83 y.o., male Today's Date: 09/21/2023   END OF SESSION:  PT End of Session - 09/21/23 1400     Visit Number 5    Date for PT Re-Evaluation 10/26/23    Authorization Type [employer] Medicare    Authorization Time Period 08/31/23 - 10/26/23    Authorization - Visit Number 5    Authorization - Number of Visits 6    Progress Note Due on Visit 10    PT Start Time 1400    PT Stop Time 1442    PT Time Calculation (min) 42 min    Activity Tolerance Patient tolerated treatment well    Behavior During Therapy [facility] for tasks assessed/performed                 Past Medical History:  Diagnosis Date   Arthritis    Cancer (HCC)    hx of prostate cancer    History of kidney stones    Hypertension    Pneumonia    06/2015    Past Surgical History:  Procedure Laterality Date   PROSTATECTOMY     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   Procedure: RIGHT TOTAL KNEE ARTHROPLASTY;  Surgeon: Liliane Rei, MD;  Location: [facility];  Service: Orthopedics;  Laterality: Right;   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   Procedure: TOTAL KNEE ARTHROPLASTY;  Surgeon: Liliane Rei, MD;  Location: [facility];  Service: Orthopedics;  Laterality: Left;    Patient Active Problem List   Diagnosis Date Noted   Sepsis (HCC) 04/13/2020   Pneumonia due to COVID-19 virus 04/13/2020   Acute hypoxemic respiratory failure (HCC) 04/13/2020   Elevated troponin 04/13/2020   Syncope 08/30/2018   Essential hypertension 08/30/2018   AKI (acute kidney injury) (HCC) 08/30/2018   Hyponatremia 08/30/2018   Volume depletion 08/30/2018   OA (osteoarthritis) of knee 09/10/2015    PCP: Drosinis, Kin Penner, PA-C   REFERRING PROVIDER: Jennifer Moellers, PA-C   REFERRING DIAG: [phone] (ICD-10-CM) - Right knee pain / R LE hamstring strain (biceps femoris)  THERAPY DIAG:  Pain in right thigh  Chronic pain of right  knee  Muscle weakness (generalized)  Other muscle spasm  Other abnormalities of gait and mobility  RATIONALE FOR EVALUATION AND TREATMENT: Rehabilitation  ONSET DATE: ~6-8 months  NEXT MD VISIT: PRN   SUBJECTIVE:  SUBJECTIVE STATEMENT: Pt reports "so far so good" with HEP.  He denies pain other than some tenderness at his R knee.  EVAL:  Pt reports pain in R posterior lateral thigh for the past 6-8 months w/o known MOI.  He states he feels like at one point in time he had a "charley horse" in his R thigh.  He notes constant pain at ~4/10.  He states his R knee acts up at times since his TKA.  PAIN: Are you having pain? Yes: NPRS scale: 4/10  Pain location: R knee  Pain description: tenderness  Aggravating factors: "just there" Relieving factors: nothing  PERTINENT HISTORY:  L TKA 2020, R TKA 2017, OA, HTN, prostate cancer  PRECAUTIONS: Fall  RED FLAGS: None  WEIGHT BEARING RESTRICTIONS: No  FALLS:  Has patient fallen in last 6 months? No, but FOF  LIVING ENVIRONMENT: Lives with: lives with their family Lives in: House/apartment Stairs: Yes: Internal: 14 steps; to basement (no need to access) and External: 2 steps; on right going up, on left going up, and can reach both Has following equipment at home: Single point cane and Grab bars  OCCUPATION: Retired  PLOF: Independent and Leisure: mostly sedentary - look at [employer], work in the yard when the weather is nicer  PATIENT GOALS: "To use my R leg completely."   OBJECTIVE: (objective measures completed at initial evaluation unless otherwise dated)  DIAGNOSTIC FINDINGS:  07/30/23 - Peripheral venous US  - R LE: IMPRESSION: No evidence of deep vein thrombosis.   PATIENT SURVEYS:  LEFS 20 / 80 = 25.0  %  COGNITION: Overall cognitive status: Within functional limits for tasks assessed    SENSATION: WFL  EDEMA:  N/A  POSTURE:  rounded shoulders, forward head, flexed trunk , and genu valgum bilaterally  PALPATION: Increased muscle tension and TTP along R lateral HS  MUSCLE LENGTH: Hamstrings: mild/mod tight R>L ITB: mod tight L>R Piriformis: WFL Hip IR: mod tight R>L Hip flexors: mod tight R>L Quads: mod tight R>L  LOWER EXTREMITY ROM:  Active ROM Right eval Left eval  Knee flexion 104 110  Knee extension 0 0   Passive ROM Right eval Left eval  Knee flexion 110 110  Knee extension    (Blank rows = not tested)  LOWER EXTREMITY MMT:  MMT Right eval Left eval  Hip flexion 3+ 4  Hip extension 2+ 3-  Hip abduction 2+ 3+  Hip adduction 3- 4-  Hip internal rotation 3- 4-  Hip external rotation 4- 4  Knee flexion 4- 4+  Knee extension 4 4+  Ankle dorsiflexion 4- 4+  Ankle plantarflexion    Ankle inversion    Ankle eversion     (Blank rows = not tested)  FUNCTIONAL TESTS:  5 times sit to stand: 20.35 sec (w/o UE assist), >15 sec indicates recurrent fall risk Timed up and go (TUG): 21.94 sec with SPC, >13.5 sec indicates high risk for falls 10 meter walk test: 35.09 sec with SPC, 0.93 ft/sec with SPC Berg Balance Scale: 37/56, 37-45 significant risk for falls (>80%)  Dynamic Gait Index: 7/24, Scores of 19 or less are predicitve of falls in older community living adults.  GAIT: Distance walked: Clinic distances Assistive device utilized: Single point cane Level of assistance: Modified independence Gait pattern: decreased stride length, knee flexed in stance- Right, knee flexed in stance- Left, shuffling, trunk flexed, and narrow BOS Comments: Stooped posture with genu valgum during gait   TODAY'S TREATMENT:   09/21/23 THERAPEUTIC  EXERCISE: To improve strength, endurance, ROM, and flexibility.  Demonstration, verbal and tactile cues throughout for technique.   NuStep - L4 x 6 min (B UE/LE) for muscle warm-up to promote tissue perfusion Seated hip ADD ball squeeze + R LAQ x 10 no resistance, 3# 2 x 10, looped RTB at ankles x 10 Seated R HS curls with looped RTB around ankles and L heel elevated on 9" stool 2 x 10 Standing alt hip ABD x 10 Standing R hip extension x 10 - pt performing more of a standing knee flexion/HS curl despite repeated VC & TC Standing R hip flexor stretch at counter 2 x 30" - VC & TC to maintain stationary R LE position while shifting weight forward and increasing upright posture  THERAPEUTIC ACTIVITIES: To improve functional performance.  Demonstration, verbal and tactile cues throughout for technique. 5xSTS = 24.94 sec with hands on knees - increased wt shift to L LE STS using rocking momentum into PWR! Up x 5 - cues for even weight shift STS + RTB hip ABD isometric using rocking momentum into PWR! Up x 5   09/18/23 THERAPEUTIC EXERCISE: to improve flexibility, ROM and strength.  Demonstration, verbal and tactile cues throughout for technique.  Seated ball squeeze x 10 Seated marching 2# x 10 BLE Seated LAQ 2# x 10 BLE Standing hip abduction x 10 BLE Standing hip extension x 10 BLE- postural cues needed Standing weight shifting 4 ways no UE support x 10 each- postural cues needed Seated HS curls RTB x 10 BLE- cues for full ROM Seated hamstring stretch with strap x 30" many cues needed to avoid knee flexion   09/14/23 THERAPEUTIC EXERCISE: to improve flexibility, ROM and strength.  Demonstration, verbal and tactile cues throughout for technique.  Rec Bike - L 1 x 3 min - feet kept slipping off pedals NuStep - L5 x 5 min (B UE/LE) for muscle warm-up to promote tissue perfusion Seated TrA + hip ADD ball squeeze 10 x 5" Seated RTB alt hip ABD/ER 10 x 3-5", 2 sets Seated RTB hip flexion slow march 10 x 3", 2 sets Seated knee extension with RTB at ankles 2 x 10 Seated knee flexion with RTB at ankles 2 x 10  SELF  CARE: Provided guidance and alternatives for use of ball (folded pillow or rolled towel/blanket) for HEP update as well as ensured patient could don and doff TheraBand without need for assistance (using cane to help transition band on and off feet).   PATIENT EDUCATION:  Education details: HEP review  Person educated: Patient Education method: Explanation, Demonstration, Tactile cues, and Verbal cues Education comprehension: verbalized understanding, returned demonstration, verbal cues required, tactile cues required, and needs further education  HOME EXERCISE PROGRAM: Access Code: [email or web address] URL: [email or web address] Date: 09/14/2023 Prepared by: Felecia Hopper  Exercises - Seated Hamstring Stretch with Strap  - 2 x daily - 7 x weekly - 3 reps - 30 sec hold - Seated Hamstring Set  - 2 x daily - 7 x weekly - 2 sets - 10 reps - 3 sec hold - Seated Hip Adduction Isometrics with Ball  - 1 x daily - 7 x weekly - 2 sets - 10 reps - 3-5 sec hold - Seated Isometric Hip Abduction with Resistance  - 1 x daily - 7 x weekly - 2 sets - 10 reps - 3 sec hold - Seated March with Resistance  - 1 x daily - 7 x weekly - 2 sets - 10 reps -  3 sec hold - Seated Hamstring Curls with Resistance  - 1 x daily - 7 x weekly - 2 sets - 10 reps - 3 sec hold - Seated Knee Extension with Resistance  - 1 x daily - 7 x weekly - 2 sets - 10 reps - 3 sec hold   ASSESSMENT:  CLINICAL IMPRESSION: Khamani reports 60% improvement in pain since start of PT with only some tenderness still noted at R knee.  Attempted to reassess 5xSTS however patient having increased difficulty initiating sit to stand with increased incident of posterior LOB and significant favoring of R LE due to R knee pain.  Worked on mechanics of sit to stand utilizing forward weight shift to improve momentum into transition to standing but patient still favoring R LE.  Assessment of R knee ROM and muscle activation revealing lateral tracking of  patella with poor VMO activation in quads therefore incorporated hip adduction ball squeeze with LAQ to promote increased medial quad activation.  Attempted to progress hip strengthening in standing however patient requiring significant cueing for correct posture due to hip flexor tightness with patient only able to achieve hamstring activation with attempts at hip extension.  Hip flexor tightness also likely contributing to continued forward flexed posture with gait.  Attempted to address hip flexor tightness with standing hip flexor stretch however patient unable to coordinate movement patterns and maintain appropriate posture therefore we will plan to try alternative versions of stretches in upcoming visits.  Ojas will benefit from continued skilled PT to address ongoing deficits to improve mobility and activity tolerance with decreased pain interference.  OBJECTIVE IMPAIRMENTS: Abnormal gait, decreased activity tolerance, decreased balance, decreased endurance, decreased knowledge of condition, decreased knowledge of use of DME, decreased mobility, difficulty walking, decreased ROM, decreased strength, decreased safety awareness, hypomobility, increased fascial restrictions, impaired perceived functional ability, increased muscle spasms, impaired flexibility, improper body mechanics, postural dysfunction, and pain.   ACTIVITY LIMITATIONS: carrying, lifting, bending, sitting, standing, squatting, sleeping, stairs, transfers, bed mobility, and locomotion level  PARTICIPATION LIMITATIONS: meal prep, cleaning, laundry, driving, shopping, community activity, and yard work  PERSONAL FACTORS: Age, Fitness, Past/current experiences, Time since onset of injury/illness/exacerbation, and 3+ comorbidities: L TKA 2020, R TKA 2017, OA, HTN, prostate cancer  are also affecting patient's functional outcome.   REHAB POTENTIAL: Good  CLINICAL DECISION MAKING: Evolving/moderate complexity  EVALUATION COMPLEXITY:  Moderate   GOALS: Goals reviewed with patient? Yes  SHORT TERM GOALS: Target date: 09/28/2023  Patient will be independent with initial HEP. Baseline:  Goal status: IN PROGRESS - 09/21/23 - cues still necessary for proper posture and movement patterns  2.  Patient will report at least 25% improvement in R thigh and knee pain to improve QOL. Baseline: 4/10 constant pain, at worst up to 7/10 Goal status: MET - 09/21/23 - Pt reports 60% improvement in pain since start of PT  3.  Complete standardized balance testing and update POC/goals as indicated. Baseline:  Goal status: MET - 09/07/23  4.  Patient will improve 5x STS time to </= 15 seconds to demonstrate improved functional strength and transfer efficiency  Baseline: 20.35 sec Goal status: IN PROGRESS - 09/21/23 - 24.94 sec  LONG TERM GOALS: Target date: 10/26/2023  Patient will be independent with advanced/ongoing HEP to improve outcomes and carryover.  Baseline:  Goal status: IN PROGRESS  2.  Patient will report at least 50-75% improvement in R thigh and knee pain to improve QOL. Baseline: 4/10 constant pain, at worst up to  7/10 Goal status: IN PROGRESS - 09/21/23 - Pt reports 60% improvement in pain since start of PT  3.  Patient will demonstrate improved R knee AROM to >/= 0-110 deg to allow for normal gait and stair mechanics. Baseline: R knee AROM 0-104 Goal status: IN PROGRESS  4.  Patient will demonstrate improved B LE strength to >/= 4 to 4+/5 for improved stability and ease of mobility. Baseline: Refer to above LE MMT table Goal status: IN PROGRESS  5.  Patient will be able to ambulate with LRAD and normal gait pattern at gait speed of >/= 1.8 ft/sec without increased pain for safe limited community access with decreased risk for recurrent falls.  Baseline: 0.93 ft/sec Goal status: IN PROGRESS  6. Patient will be able to ascend/descend stairs with 1 HR and reciprocal step pattern safely to access home and community.   Baseline: Step-to/alternating pattern with SPC and 1 HR with CGA of PT  Goal status: IN PROGRESS  7.  Patient will report >/= 29/80 on LEFS (MCID = 9 pts) to demonstrate improved functional ability. Baseline: 20 / 80 = 25.0 % Goal status: IN PROGRESS  8.  Patient will demonstrate at least 15/24 on DGI to decrease risk of falls. Baseline: 7/24 Goal status: IN PROGRESS    PLAN:  PT FREQUENCY: 2x/week  PT DURATION: 8 weeks  PLANNED INTERVENTIONS: 97164- PT Re-evaluation, 97110-Therapeutic exercises, 97530- Therapeutic activity, 97112- Neuromuscular re-education, 97535- Self Care, 69629- Manual therapy, [phone]- Gait training, [phone]- Aquatic Therapy, 97014- Electrical stimulation (unattended), [phone]- Electrical stimulation (manual), 97016- Vasopneumatic device, [id number]- Ultrasound, 53664- Ionotophoresis 4mg /ml Dexamethasone , Patient/Family education, Balance training, Stair training, Taping, Dry Needling, Joint mobilization, Scar mobilization, DME instructions, Cryotherapy, and Moist heat  PLAN FOR NEXT SESSION: *Needs [employer] Medicare reauthorization; Progress proximal LE flexibility (work on hip flexor stretches) and strengthening - update HEP accordingly; MT +/- DN to address abnormal muscle tension in R lateral hamstrings and quads   Francisco Irving, PT 09/21/2023, 6:26 PM     Date of referral: 07/30/2023 Referring provider: Jennifer Moellers, PA-C Referring diagnosis? M25.561 (ICD-10-CM) - Right knee pain / R LE hamstring strain (biceps femoris) Treatment diagnosis? (if different than referring diagnosis)  Pain in right thigh  Chronic pain of right knee  Muscle weakness (generalized)  Other muscle spasm  Other abnormalities of gait and mobility  What was this (referring dx) caused by? Ongoing Issue and Arthritis  Lonne Roan of Condition: Chronic (continuous duration > 3 months)   Laterality: Rt  Current Functional Measure Score: LEFS 20 / 80 = 25.0 %  Objective measurements  identify impairments when they are compared to normal values, the uninvolved extremity, and prior level of function.  [x]  Yes  []  No  Objective assessment of functional ability: Moderate functional limitations   Briefly describe symptoms: Patient reports onset of R posterior lateral thigh pain beginning ~6-8 months ago without known MOI, however thinks he may recall having had a "charley horse" in his hamstring at some point.  He also notes ongoing intermittent R knee pain since his R TKA in 2017.  He is unable to identify aggravating factors, stating pain is "just there", nor is he able to find anything that gives him significant relief from pain. Patient has deficits in B knee ROM, B proximal LE flexibility, BLE strength, abnormal posture and gait mechanics, and TTP with abnormal muscle tension in R lateral hamstring which are interfering with ADLs and are impacting quality of life.  On LEFS patient scored  20/80 demonstrating 75% disability.  Gait speed significantly decreased at 0.93 ft/sec with SPC placing him at a household ambulator level with an elevated risk for recurrent falls, therefore we will plan for further standardized balance testing next visit.    How did symptoms start: uncertain, possibly muscle cramp  Average pain intensity:  Last 24 hours: 6/10  Past week: 6/10  How often does the pt experience symptoms? Constantly  How much have the symptoms interfered with usual daily activities? Quite a bit  How has condition changed since care began at this facility? NA - initial visit  In general, how is the patients overall health? Fair  Onset date: ~6-8 months   BACK PAIN (STarT Back Screening Tool) - (When applicable): N/A  Has your back pain spread down your leg(s) at sometime in the last 2 weeks? []  Yes   []  No Have you had pain in the shoulder or neck at sometime in the past 2 weeks? []  Yes   []  No Have you only walked short distances because of your back pain? []  Yes    []  No In the past 2 weeks, have you dressed more slowly than usual because of your back pain? []  Yes   []  No Do you think it is not really safe for person with a condition like yours to be physically active? []  Yes   []  No Have worrying thoughts been going through your mind a lot of the time? []  Yes   []  No Do you feel that your back pain is terrible and it is never going to get any better? []  Yes   []  No In general, have you stopped enjoying all the things you usually enjoy? []  Yes   []  No Overall, how bothersome has your back pain been in the last 2 weeks? []  Not at all   []  Slightly     []  Moderate   []  Very much     []  Extremely

## 2023-09-25 ENCOUNTER — Ambulatory Visit: Payer: Medicare Other

## 2023-09-25 DIAGNOSIS — M62838 Other muscle spasm: Secondary | ICD-10-CM

## 2023-09-25 DIAGNOSIS — R2689 Other abnormalities of gait and mobility: Secondary | ICD-10-CM

## 2023-09-25 DIAGNOSIS — M79651 Pain in right thigh: Secondary | ICD-10-CM | POA: Diagnosis not present

## 2023-09-25 DIAGNOSIS — G8929 Other chronic pain: Secondary | ICD-10-CM

## 2023-09-25 DIAGNOSIS — M6281 Muscle weakness (generalized): Secondary | ICD-10-CM

## 2023-09-25 NOTE — Therapy (Signed)
OUTPATIENT PHYSICAL THERAPY TREATMENT   Patient Name: Seth [name] MRN: [id number] DOB:[birth year], 83 y.o., male Today's Date: 09/25/2023   END OF SESSION:  PT End of Session - 09/25/23 1112     Visit Number 6    Date for PT Re-Evaluation 10/26/23    Authorization Type [employer]    Authorization Time Period 08/31/23 - 10/26/23    Authorization - Visit Number 6    Authorization - Number of Visits 6    Progress Note Due on Visit 10    PT Start Time 1104    PT Stop Time 1148    PT Time Calculation (min) 44 min    Activity Tolerance Patient tolerated treatment well    Behavior During Therapy [facility] for tasks assessed/performed                  Past Medical History:  Diagnosis Date   Arthritis    Cancer (HCC)    hx of prostate cancer    History of kidney stones    Hypertension    Pneumonia    06/2015    Past Surgical History:  Procedure Laterality Date   PROSTATECTOMY     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   Procedure: RIGHT TOTAL KNEE ARTHROPLASTY;  Surgeon: Ollen Gross, MD;  Location: [facility];  Service: Orthopedics;  Laterality: Right;   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   Procedure: TOTAL KNEE ARTHROPLASTY;  Surgeon: Ollen Gross, MD;  Location: [facility];  Service: Orthopedics;  Laterality: Left;    Patient Active Problem List   Diagnosis Date Noted   Sepsis (HCC) 04/13/2020   Pneumonia due to COVID-19 virus 04/13/2020   Acute hypoxemic respiratory failure (HCC) 04/13/2020   Elevated troponin 04/13/2020   Syncope 08/30/2018   Essential hypertension 08/30/2018   AKI (acute kidney injury) (HCC) 08/30/2018   Hyponatremia 08/30/2018   Volume depletion 08/30/2018   OA (osteoarthritis) of knee 09/10/2015    PCP: Drosinis, Leonia Reader, PA-C   REFERRING PROVIDER: Bill Salinas, PA-C   REFERRING DIAG: [phone] (ICD-10-CM) - Right knee pain / R LE hamstring strain (biceps femoris)  THERAPY DIAG:  Pain in right thigh  Chronic pain of  right knee  Muscle weakness (generalized)  Other muscle spasm  Other abnormalities of gait and mobility  RATIONALE FOR EVALUATION AND TREATMENT: Rehabilitation  ONSET DATE: ~6-8 months  NEXT MD VISIT: PRN   SUBJECTIVE:  SUBJECTIVE STATEMENT: Pt reports "so far so good" with HEP.  He denies pain other than some tenderness at his R knee.  EVAL:  Pt reports pain in R posterior lateral thigh for the past 6-8 months w/o known MOI.  He states he feels like at one point in time he had a "charley horse" in his R thigh.  He notes constant pain at ~4/10.  He states his R knee acts up at times since his TKA.  PAIN: Are you having pain? Yes: NPRS scale: 5/10  Pain location: R knee and posterior thigh  Pain description: tenderness  Aggravating factors: "just there" Relieving factors: nothing  PERTINENT HISTORY:  L TKA 2020, R TKA 2017, OA, HTN, prostate cancer  PRECAUTIONS: Fall  RED FLAGS: None  WEIGHT BEARING RESTRICTIONS: No  FALLS:  Has patient fallen in last 6 months? No, but FOF  LIVING ENVIRONMENT: Lives with: lives with their family Lives in: House/apartment Stairs: Yes: Internal: 14 steps; to basement (no need to access) and External: 2 steps; on right going up, on left going up, and can reach both Has following equipment at home: Single point cane and Grab bars  OCCUPATION: Retired  PLOF: Independent and Leisure: mostly sedentary - look at [employer], work in the yard when the weather is nicer  PATIENT GOALS: "To use my R leg completely."   OBJECTIVE: (objective measures completed at initial evaluation unless otherwise dated)  DIAGNOSTIC FINDINGS:  07/30/23 - Peripheral venous US - R LE: IMPRESSION: No evidence of deep vein thrombosis.   PATIENT SURVEYS:  LEFS 20 / 80 =  25.0 %  COGNITION: Overall cognitive status: Within functional limits for tasks assessed    SENSATION: WFL  EDEMA:  N/A  POSTURE:  rounded shoulders, forward head, flexed trunk , and genu valgum bilaterally  PALPATION: Increased muscle tension and TTP along R lateral HS  MUSCLE LENGTH: Hamstrings: mild/mod tight R>L ITB: mod tight L>R Piriformis: WFL Hip IR: mod tight R>L Hip flexors: mod tight R>L Quads: mod tight R>L  LOWER EXTREMITY ROM:  Active ROM Right eval Left eval  Knee flexion 104 110  Knee extension 0 0   Passive ROM Right eval Left eval  Knee flexion 110 110  Knee extension    (Blank rows = not tested)  LOWER EXTREMITY MMT:  MMT Right eval Left eval  Hip flexion 3+ 4  Hip extension 2+ 3-  Hip abduction 2+ 3+  Hip adduction 3- 4-  Hip internal rotation 3- 4-  Hip external rotation 4- 4  Knee flexion 4- 4+  Knee extension 4 4+  Ankle dorsiflexion 4- 4+  Ankle plantarflexion    Ankle inversion    Ankle eversion     (Blank rows = not tested)  FUNCTIONAL TESTS:  5 times sit to stand: 20.35 sec (w/o UE assist), >15 sec indicates recurrent fall risk Timed up and go (TUG): 21.94 sec with SPC, >13.5 sec indicates high risk for falls 10 meter walk test: 35.09 sec with SPC, 0.93 ft/sec with SPC Berg Balance Scale: 37/56, 37-45 significant risk for falls (>80%)  Dynamic Gait Index: 7/24, Scores of 19 or less are predicitve of falls in older community living adults.  GAIT: Distance walked: Clinic distances Assistive device utilized: Single point cane Level of assistance: Modified independence Gait pattern: decreased stride length, knee flexed in stance- Right, knee flexed in stance- Left, shuffling, trunk flexed, and narrow BOS Comments: Stooped posture with genu valgum during gait   TODAY'S TREATMENT:  09/25/23 THERAPEUTIC EXERCISE: To improve strength, endurance, ROM, and flexibility.  Demonstration, verbal and tactile cues throughout for  technique.  NuStep - L5 x 6 min (B UE/LE) for muscle warm-up to promote tissue perfusion Supine hip ER/IR x 10 both ways Supine clams GTB x 10  Supine marching GTB x 10 Seated LAQ 2# 2x10 BLE Seated marching 2# 2x10 BLE   THERAPEUTIC ACTIVITIES: To improve functional performance.  Demonstration, verbal and tactile cues throughout for technique. LEFS: 39 / 80 = 48.8 % Sit to stands x 10 - cues to scoot forward in chair Standing hip extension x 10 bil- cues for posture Standing hip abduction x 10 bil  09/21/23 THERAPEUTIC EXERCISE: To improve strength, endurance, ROM, and flexibility.  Demonstration, verbal and tactile cues throughout for technique.  NuStep - L4 x 6 min (B UE/LE) for muscle warm-up to promote tissue perfusion Seated hip ADD ball squeeze + R LAQ x 10 no resistance, 3# 2 x 10, looped RTB at ankles x 10 Seated R HS curls with looped RTB around ankles and L heel elevated on 9" stool 2 x 10 Standing alt hip ABD x 10 Standing R hip extension x 10 - pt performing more of a standing knee flexion/HS curl despite repeated VC & TC Standing R hip flexor stretch at counter 2 x 30" - VC & TC to maintain stationary R LE position while shifting weight forward and increasing upright posture  THERAPEUTIC ACTIVITIES: To improve functional performance.  Demonstration, verbal and tactile cues throughout for technique. 5xSTS = 24.94 sec with hands on knees - increased wt shift to L LE STS using rocking momentum into PWR! Up x 5 - cues for even weight shift STS + RTB hip ABD isometric using rocking momentum into PWR! Up x 5   09/18/23 THERAPEUTIC EXERCISE: to improve flexibility, ROM and strength.  Demonstration, verbal and tactile cues throughout for technique.  Seated ball squeeze x 10 Seated marching 2# x 10 BLE Seated LAQ 2# x 10 BLE Standing hip abduction x 10 BLE Standing hip extension x 10 BLE- postural cues needed Standing weight shifting 4 ways no UE support x 10 each- postural  cues needed Seated HS curls RTB x 10 BLE- cues for full ROM Seated hamstring stretch with strap x 30" many cues needed to avoid knee flexion   09/14/23 THERAPEUTIC EXERCISE: to improve flexibility, ROM and strength.  Demonstration, verbal and tactile cues throughout for technique.  Rec Bike - L 1 x 3 min - feet kept slipping off pedals NuStep - L5 x 5 min (B UE/LE) for muscle warm-up to promote tissue perfusion Seated TrA + hip ADD ball squeeze 10 x 5" Seated RTB alt hip ABD/ER 10 x 3-5", 2 sets Seated RTB hip flexion slow march 10 x 3", 2 sets Seated knee extension with RTB at ankles 2 x 10 Seated knee flexion with RTB at ankles 2 x 10  SELF CARE: Provided guidance and alternatives for use of ball (folded pillow or rolled towel/blanket) for HEP update as well as ensured patient could don and doff TheraBand without need for assistance (using cane to help transition band on and off feet).   PATIENT EDUCATION:  Education details: HEP review  Person educated: Patient Education method: Explanation, Demonstration, Tactile cues, and Verbal cues Education comprehension: verbalized understanding, returned demonstration, verbal cues required, tactile cues required, and needs further education  HOME EXERCISE PROGRAM: Access Code: [email or web address] URL: [email or web address] Date: 09/14/2023 Prepared by: Glenetta Hew  Exercises -  Seated Hamstring Stretch with Strap  - 2 x daily - 7 x weekly - 3 reps - 30 sec hold - Seated Hamstring Set  - 2 x daily - 7 x weekly - 2 sets - 10 reps - 3 sec hold - Seated Hip Adduction Isometrics with Ball  - 1 x daily - 7 x weekly - 2 sets - 10 reps - 3-5 sec hold - Seated Isometric Hip Abduction with Resistance  - 1 x daily - 7 x weekly - 2 sets - 10 reps - 3 sec hold - Seated March with Resistance  - 1 x daily - 7 x weekly - 2 sets - 10 reps - 3 sec hold - Seated Hamstring Curls with Resistance  - 1 x daily - 7 x weekly - 2 sets - 10 reps - 3 sec hold -  Seated Knee Extension with Resistance  - 1 x daily - 7 x weekly - 2 sets - 10 reps - 3 sec hold   ASSESSMENT:  CLINICAL IMPRESSION: Seth [name] still presents with R knee and posterior thigh pain. Overall his pain has shown some improvement, he is still walking with a slow gait velocity with decreased stride length. Pain is still limiting his ability to perform daily activities. He does show 19 point improvement in LEFS. We worked on functional movements such as sit to stands and standing hip strengthening to improve WB tolerance and allow safety with transfers. Continued strengthening for bil hips with mat level interventions. He still requires many cues for posture with standing exercise and fatigues rather quickly. Seth [name] will benefit from continued skilled PT to address ongoing deficits to improve mobility and activity tolerance with decreased pain interference.  OBJECTIVE IMPAIRMENTS: Abnormal gait, decreased activity tolerance, decreased balance, decreased endurance, decreased knowledge of condition, decreased knowledge of use of DME, decreased mobility, difficulty walking, decreased ROM, decreased strength, decreased safety awareness, hypomobility, increased fascial restrictions, impaired perceived functional ability, increased muscle spasms, impaired flexibility, improper body mechanics, postural dysfunction, and pain.   ACTIVITY LIMITATIONS: carrying, lifting, bending, sitting, standing, squatting, sleeping, stairs, transfers, bed mobility, and locomotion level  PARTICIPATION LIMITATIONS: meal prep, cleaning, laundry, driving, shopping, community activity, and yard work  PERSONAL FACTORS: Age, Fitness, Past/current experiences, Time since onset of injury/illness/exacerbation, and 3+ comorbidities: L TKA 2020, R TKA 2017, OA, HTN, prostate cancer  are also affecting patient's functional outcome.   REHAB POTENTIAL: Good  CLINICAL DECISION MAKING: Evolving/moderate complexity  EVALUATION  COMPLEXITY: Moderate   GOALS: Goals reviewed with patient? Yes  SHORT TERM GOALS: Target date: 09/28/2023  Patient will be independent with initial HEP. Baseline:  Goal status: IN PROGRESS - 09/21/23 - cues still necessary for proper posture and movement patterns  2.  Patient will report at least 25% improvement in R thigh and knee pain to improve QOL. Baseline: 4/10 constant pain, at worst up to 7/10 Goal status: MET - 09/21/23 - Pt reports 60% improvement in pain since start of PT  3.  Complete standardized balance testing and update POC/goals as indicated. Baseline:  Goal status: MET - 09/07/23  4.  Patient will improve 5x STS time to </= 15 seconds to demonstrate improved functional strength and transfer efficiency  Baseline: 20.35 sec Goal status: IN PROGRESS - 09/21/23 - 24.94 sec  LONG TERM GOALS: Target date: 10/26/2023  Patient will be independent with advanced/ongoing HEP to improve outcomes and carryover.  Baseline:  Goal status: IN PROGRESS  2.  Patient will report at least 50-75%  improvement in R thigh and knee pain to improve QOL. Baseline: 4/10 constant pain, at worst up to 7/10 Goal status: IN PROGRESS - 09/21/23 - Pt reports 60% improvement in pain since start of PT  3.  Patient will demonstrate improved R knee AROM to >/= 0-110 deg to allow for normal gait and stair mechanics. Baseline: R knee AROM 0-104 Goal status: IN PROGRESS  4.  Patient will demonstrate improved B LE strength to >/= 4 to 4+/5 for improved stability and ease of mobility. Baseline: Refer to above LE MMT table Goal status: IN PROGRESS  5.  Patient will be able to ambulate with LRAD and normal gait pattern at gait speed of >/= 1.8 ft/sec without increased pain for safe limited community access with decreased risk for recurrent falls.  Baseline: 0.93 ft/sec Goal status: IN PROGRESS  6. Patient will be able to ascend/descend stairs with 1 HR and reciprocal step pattern safely to access home  and community.  Baseline: Step-to/alternating pattern with SPC and 1 HR with CGA of PT  Goal status: IN PROGRESS  7.  Patient will report >/= 29/80 on LEFS (MCID = 9 pts) to demonstrate improved functional ability. Baseline: 20 / 80 = 25.0 % Goal status: IN PROGRESS- 09/25/23 (39 / 80 = 48.8 %)  8.  Patient will demonstrate at least 15/24 on DGI to decrease risk of falls. Baseline: 7/24 Goal status: IN PROGRESS    PLAN:  PT FREQUENCY: 2x/week  PT DURATION: 8 weeks  PLANNED INTERVENTIONS: 97164- PT Re-evaluation, 97110-Therapeutic exercises, 97530- Therapeutic activity, 97112- Neuromuscular re-education, 97535- Self Care, 09811- Manual therapy, [id number]- Gait training, [phone]- Aquatic Therapy, 97014- Electrical stimulation (unattended), [phone]- Electrical stimulation (manual), 97016- Vasopneumatic device, 97035- Ultrasound, 13086- Ionotophoresis 4mg /ml Dexamethasone, Patient/Family education, Balance training, Stair training, Taping, Dry Needling, Joint mobilization, Scar mobilization, DME instructions, Cryotherapy, and Moist heat  PLAN FOR NEXT SESSION: Progress proximal LE flexibility (work on hip flexor stretches) and strengthening - update HEP accordingly; MT +/- DN to address abnormal muscle tension in R lateral hamstrings and quads   Shaliyah Taite Caprice Red, PTA 09/25/2023, 12:07 PM     Date of referral: 07/30/2023 Referring provider: Bill Salinas, PA-C Referring diagnosis? M25.561 (ICD-10-CM) - Right knee pain / R LE hamstring strain (biceps femoris) Treatment diagnosis? (if different than referring diagnosis)  Pain in right thigh  Chronic pain of right knee  Muscle weakness (generalized)  Other muscle spasm  Other abnormalities of gait and mobility  What was this (referring dx) caused by? Ongoing Issue and Arthritis  Nature of Condition: Chronic (continuous duration > 3 months)   Laterality: Rt  Current Functional Measure Score: LEFS 39 / 80 = 48.8 %  Objective  measurements identify impairments when they are compared to normal values, the uninvolved extremity, and prior level of function.  [x]  Yes  []  No  Objective assessment of functional ability: Moderate functional limitations   Briefly describe symptoms: Patient reports onset of R posterior lateral thigh pain beginning ~6-8 months ago without known MOI, however thinks he may recall having had a "charley horse" in his hamstring at some point.  He also notes ongoing intermittent R knee pain since his R TKA in 2017.  He is unable to identify aggravating factors, stating pain is "just there", nor is he able to find anything that gives him significant relief from pain. Patient has deficits in B knee ROM, B proximal LE flexibility, BLE strength, abnormal posture and gait mechanics, and TTP with abnormal muscle  tension in R lateral hamstring which are interfering with ADLs and are impacting quality of life.  On LEFS patient scored 20/80 demonstrating 75% disability.  Gait speed significantly decreased at 0.93 ft/sec with SPC placing him at a household ambulator level with an elevated risk for recurrent falls, therefore we will plan for further standardized balance testing next visit.    How did symptoms start: uncertain, possibly muscle cramp  Average pain intensity:  Last 24 hours: 5/10  Past week: 5/10  How often does the pt experience symptoms? Constantly  How much have the symptoms interfered with usual daily activities? Moderately  How has condition changed since care began at this facility? A little better  In general, how is the patients overall health? Fair  Onset date: ~6-8 months   BACK PAIN (STarT Back Screening Tool) - (When applicable): N/A  Has your back pain spread down your leg(s) at sometime in the last 2 weeks? []  Yes   []  No Have you had pain in the shoulder or neck at sometime in the past 2 weeks? []  Yes   []  No Have you only walked short distances because of your back  pain? []  Yes   []  No In the past 2 weeks, have you dressed more slowly than usual because of your back pain? []  Yes   []  No Do you think it is not really safe for person with a condition like yours to be physically active? []  Yes   []  No Have worrying thoughts been going through your mind a lot of the time? []  Yes   []  No Do you feel that your back pain is terrible and it is never going to get any better? []  Yes   []  No In general, have you stopped enjoying all the things you usually enjoy? []  Yes   []  No Overall, how bothersome has your back pain been in the last 2 weeks? []  Not at all   []  Slightly     []  Moderate   []  Very much     []  Extremely

## 2023-09-28 ENCOUNTER — Ambulatory Visit: Payer: Medicare Other

## 2023-09-28 DIAGNOSIS — M79651 Pain in right thigh: Secondary | ICD-10-CM | POA: Diagnosis not present

## 2023-09-28 DIAGNOSIS — R2689 Other abnormalities of gait and mobility: Secondary | ICD-10-CM

## 2023-09-28 DIAGNOSIS — M6281 Muscle weakness (generalized): Secondary | ICD-10-CM

## 2023-09-28 DIAGNOSIS — G8929 Other chronic pain: Secondary | ICD-10-CM

## 2023-09-28 DIAGNOSIS — M62838 Other muscle spasm: Secondary | ICD-10-CM

## 2023-09-28 NOTE — Therapy (Signed)
OUTPATIENT PHYSICAL THERAPY TREATMENT   Patient Name: Seth [name] MRN: [id number] DOB:June 18, 1941, 83 y.o., male Today's Date: 09/28/2023   END OF SESSION:  PT End of Session - 09/28/23 1446     Visit Number 7    Date for PT Re-Evaluation 10/26/23    Authorization Type [employer]    Authorization Time Period --    Authorization - Number of Visits --    Progress Note Due on Visit 10    PT Start Time 1404    PT Stop Time 1443    PT Time Calculation (min) 39 min    Activity Tolerance Patient tolerated treatment well    Behavior During Therapy [facility] for tasks assessed/performed                   Past Medical History:  Diagnosis Date   Arthritis    Cancer (HCC)    hx of prostate cancer    History of kidney stones    Hypertension    Pneumonia    06/2015    Past Surgical History:  Procedure Laterality Date   PROSTATECTOMY     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   Procedure: RIGHT TOTAL KNEE ARTHROPLASTY;  Surgeon: Ollen Gross, MD;  Location: [facility];  Service: Orthopedics;  Laterality: Right;   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   Procedure: TOTAL KNEE ARTHROPLASTY;  Surgeon: Ollen Gross, MD;  Location: [facility];  Service: Orthopedics;  Laterality: Left;    Patient Active Problem List   Diagnosis Date Noted   Sepsis (HCC) 04/13/2020   Pneumonia due to COVID-19 virus 04/13/2020   Acute hypoxemic respiratory failure (HCC) 04/13/2020   Elevated troponin 04/13/2020   Syncope 08/30/2018   Essential hypertension 08/30/2018   AKI (acute kidney injury) (HCC) 08/30/2018   Hyponatremia 08/30/2018   Volume depletion 08/30/2018   OA (osteoarthritis) of knee 09/10/2015    PCP: Drosinis, Leonia Reader, PA-C   REFERRING PROVIDER: Bill Salinas, PA-C   REFERRING DIAG: [phone] (ICD-10-CM) - Right knee pain / R LE hamstring strain (biceps femoris)  THERAPY DIAG:  Pain in right thigh  Chronic pain of right knee  Muscle weakness  (generalized)  Other muscle spasm  Other abnormalities of gait and mobility  RATIONALE FOR EVALUATION AND TREATMENT: Rehabilitation  ONSET DATE: ~6-8 months  NEXT MD VISIT: PRN   SUBJECTIVE:  SUBJECTIVE STATEMENT: Doing good other than some pain in his R knee  EVAL:  Pt reports pain in R posterior lateral thigh for the past 6-8 months w/o known MOI.  He states he feels like at one point in time he had a "charley horse" in his R thigh.  He notes constant pain at ~4/10.  He states his R knee acts up at times since his TKA.  PAIN: Are you having pain? Yes: NPRS scale: 2-3/10  Pain location: R knee Pain description: tenderness  Aggravating factors: "just there" Relieving factors: nothing  PERTINENT HISTORY:  L TKA 2020, R TKA 2017, OA, HTN, prostate cancer  PRECAUTIONS: Fall  RED FLAGS: None  WEIGHT BEARING RESTRICTIONS: No  FALLS:  Has patient fallen in last 6 months? No, but FOF  LIVING ENVIRONMENT: Lives with: lives with their family Lives in: House/apartment Stairs: Yes: Internal: 14 steps; to basement (no need to access) and External: 2 steps; on right going up, on left going up, and can reach both Has following equipment at home: Single point cane and Grab bars  OCCUPATION: Retired  PLOF: Independent and Leisure: mostly sedentary - look at [employer], work in the yard when the weather is nicer  PATIENT GOALS: "To use my R leg completely."   OBJECTIVE: (objective measures completed at initial evaluation unless otherwise dated)  DIAGNOSTIC FINDINGS:  07/30/23 - Peripheral venous US - R LE: IMPRESSION: No evidence of deep vein thrombosis.   PATIENT SURVEYS:  LEFS 20 / 80 = 25.0 %  COGNITION: Overall cognitive status: Within functional limits for tasks  assessed    SENSATION: WFL  EDEMA:  N/A  POSTURE:  rounded shoulders, forward head, flexed trunk , and genu valgum bilaterally  PALPATION: Increased muscle tension and TTP along R lateral HS  MUSCLE LENGTH: Hamstrings: mild/mod tight R>L ITB: mod tight L>R Piriformis: WFL Hip IR: mod tight R>L Hip flexors: mod tight R>L Quads: mod tight R>L  LOWER EXTREMITY ROM:  Active ROM Right eval Left eval  Knee flexion 104 110  Knee extension 0 0   Passive ROM Right eval Left eval  Knee flexion 110 110  Knee extension    (Blank rows = not tested)  LOWER EXTREMITY MMT:  MMT Right eval Left eval  Hip flexion 3+ 4  Hip extension 2+ 3-  Hip abduction 2+ 3+  Hip adduction 3- 4-  Hip internal rotation 3- 4-  Hip external rotation 4- 4  Knee flexion 4- 4+  Knee extension 4 4+  Ankle dorsiflexion 4- 4+  Ankle plantarflexion    Ankle inversion    Ankle eversion     (Blank rows = not tested)  FUNCTIONAL TESTS:  5 times sit to stand: 20.35 sec (w/o UE assist), >15 sec indicates recurrent fall risk Timed up and go (TUG): 21.94 sec with SPC, >13.5 sec indicates high risk for falls 10 meter walk test: 35.09 sec with SPC, 0.93 ft/sec with SPC Berg Balance Scale: 37/56, 37-45 significant risk for falls (>80%)  Dynamic Gait Index: 7/24, Scores of 19 or less are predicitve of falls in older community living adults.  GAIT: Distance walked: Clinic distances Assistive device utilized: Single point cane Level of assistance: Modified independence Gait pattern: decreased stride length, knee flexed in stance- Right, knee flexed in stance- Left, shuffling, trunk flexed, and narrow BOS Comments: Stooped posture with genu valgum during gait   TODAY'S TREATMENT:  09/28/23 THERAPEUTIC EXERCISE: To improve strength, endurance, ROM, and flexibility.  Demonstration, verbal and  tactile cues throughout for technique.  NuStep - L5 x 6 min (B UE/LE) for muscle warm-up to promote tissue  perfusion 5xSTS- 18 seconds Standing hip abduction x 10 - cues to avoid hip hiking Standing hip extension x 10- cues for upright posture Seated hamstring curl RTB 2x15 RLE Seated LAQ 3lb 2x10 Seated hamstring stretch Standing toe tap to 6' step  Manual Therapy: to decrease muscle spasm, pain and improve mobility.  Gentle STM to R distal lateral hamstrings   09/25/23 THERAPEUTIC EXERCISE: To improve strength, endurance, ROM, and flexibility.  Demonstration, verbal and tactile cues throughout for technique.  NuStep - L5 x 6 min (B UE/LE) for muscle warm-up to promote tissue perfusion Supine hip ER/IR x 10 both ways Supine clams GTB x 10  Supine marching GTB x 10 Seated LAQ 2# 2x10 BLE Seated marching 2# 2x10 BLE   THERAPEUTIC ACTIVITIES: To improve functional performance.  Demonstration, verbal and tactile cues throughout for technique. LEFS: 39 / 80 = 48.8 % Sit to stands x 10 - cues to scoot forward in chair Standing hip extension x 10 bil- cues for posture Standing hip abduction x 10 bil  09/21/23 THERAPEUTIC EXERCISE: To improve strength, endurance, ROM, and flexibility.  Demonstration, verbal and tactile cues throughout for technique.  NuStep - L4 x 6 min (B UE/LE) for muscle warm-up to promote tissue perfusion Seated hip ADD ball squeeze + R LAQ x 10 no resistance, 3# 2 x 10, looped RTB at ankles x 10 Seated R HS curls with looped RTB around ankles and L heel elevated on 9" stool 2 x 10 Standing alt hip ABD x 10 Standing R hip extension x 10 - pt performing more of a standing knee flexion/HS curl despite repeated VC & TC Standing R hip flexor stretch at counter 2 x 30" - VC & TC to maintain stationary R LE position while shifting weight forward and increasing upright posture  THERAPEUTIC ACTIVITIES: To improve functional performance.  Demonstration, verbal and tactile cues throughout for technique. 5xSTS = 24.94 sec with hands on knees - increased wt shift to L LE STS using  rocking momentum into PWR! Up x 5 - cues for even weight shift STS + RTB hip ABD isometric using rocking momentum into PWR! Up x 5   09/18/23 THERAPEUTIC EXERCISE: to improve flexibility, ROM and strength.  Demonstration, verbal and tactile cues throughout for technique.  Seated ball squeeze x 10 Seated marching 2# x 10 BLE Seated LAQ 2# x 10 BLE Standing hip abduction x 10 BLE Standing hip extension x 10 BLE- postural cues needed Standing weight shifting 4 ways no UE support x 10 each- postural cues needed Seated HS curls RTB x 10 BLE- cues for full ROM Seated hamstring stretch with strap x 30" many cues needed to avoid knee flexion   09/14/23 THERAPEUTIC EXERCISE: to improve flexibility, ROM and strength.  Demonstration, verbal and tactile cues throughout for technique.  Rec Bike - L 1 x 3 min - feet kept slipping off pedals NuStep - L5 x 5 min (B UE/LE) for muscle warm-up to promote tissue perfusion Seated TrA + hip ADD ball squeeze 10 x 5" Seated RTB alt hip ABD/ER 10 x 3-5", 2 sets Seated RTB hip flexion slow march 10 x 3", 2 sets Seated knee extension with RTB at ankles 2 x 10 Seated knee flexion with RTB at ankles 2 x 10  SELF CARE: Provided guidance and alternatives for use of ball (folded pillow or rolled  towel/blanket) for HEP update as well as ensured patient could don and doff TheraBand without need for assistance (using cane to help transition band on and off feet).   PATIENT EDUCATION:  Education details: HEP review  Person educated: Patient Education method: Explanation, Demonstration, Tactile cues, and Verbal cues Education comprehension: verbalized understanding, returned demonstration, verbal cues required, tactile cues required, and needs further education  HOME EXERCISE PROGRAM: Access Code: [email or web address] URL: [email or web address] Date: 09/14/2023 Prepared by: Glenetta Hew  Exercises - Seated Hamstring Stretch with Strap  - 2 x daily - 7 x weekly -  3 reps - 30 sec hold - Seated Hamstring Set  - 2 x daily - 7 x weekly - 2 sets - 10 reps - 3 sec hold - Seated Hip Adduction Isometrics with Ball  - 1 x daily - 7 x weekly - 2 sets - 10 reps - 3-5 sec hold - Seated Isometric Hip Abduction with Resistance  - 1 x daily - 7 x weekly - 2 sets - 10 reps - 3 sec hold - Seated March with Resistance  - 1 x daily - 7 x weekly - 2 sets - 10 reps - 3 sec hold - Seated Hamstring Curls with Resistance  - 1 x daily - 7 x weekly - 2 sets - 10 reps - 3 sec hold - Seated Knee Extension with Resistance  - 1 x daily - 7 x weekly - 2 sets - 10 reps - 3 sec hold   ASSESSMENT:  CLINICAL IMPRESSION:   Mr. Etienne still presents with R knee pain. Continued strengthening for BLE with progressions made to interventions. Pt shows improved 5xSTS score by 2 points today. He still requires many cues for posture with standing exercise, therefore I did not add to HEP yet.  Ry will benefit from continued skilled PT to address ongoing deficits to improve mobility and activity tolerance with decreased pain interference.  OBJECTIVE IMPAIRMENTS: Abnormal gait, decreased activity tolerance, decreased balance, decreased endurance, decreased knowledge of condition, decreased knowledge of use of DME, decreased mobility, difficulty walking, decreased ROM, decreased strength, decreased safety awareness, hypomobility, increased fascial restrictions, impaired perceived functional ability, increased muscle spasms, impaired flexibility, improper body mechanics, postural dysfunction, and pain.   ACTIVITY LIMITATIONS: carrying, lifting, bending, sitting, standing, squatting, sleeping, stairs, transfers, bed mobility, and locomotion level  PARTICIPATION LIMITATIONS: meal prep, cleaning, laundry, driving, shopping, community activity, and yard work  PERSONAL FACTORS: Age, Fitness, Past/current experiences, Time since onset of injury/illness/exacerbation, and 3+ comorbidities: L TKA 2020, R TKA  2017, OA, HTN, prostate cancer  are also affecting patient's functional outcome.   REHAB POTENTIAL: Good  CLINICAL DECISION MAKING: Evolving/moderate complexity  EVALUATION COMPLEXITY: Moderate   GOALS: Goals reviewed with patient? Yes  SHORT TERM GOALS: Target date: 09/28/2023  Patient will be independent with initial HEP. Baseline:  Goal status: IN PROGRESS - 09/21/23 - cues still necessary for proper posture and movement patterns  2.  Patient will report at least 25% improvement in R thigh and knee pain to improve QOL. Baseline: 4/10 constant pain, at worst up to 7/10 Goal status: MET - 09/21/23 - Pt reports 60% improvement in pain since start of PT  3.  Complete standardized balance testing and update POC/goals as indicated. Baseline:  Goal status: MET - 09/07/23  4.  Patient will improve 5x STS time to </= 15 seconds to demonstrate improved functional strength and transfer efficiency  Baseline: 20.35 sec Goal status: IN PROGRESS - 09/28/23 18 seconds  LONG TERM GOALS: Target date: 10/26/2023  Patient will be independent with advanced/ongoing HEP to improve outcomes and carryover.  Baseline:  Goal status: IN PROGRESS  2.  Patient will report at least 50-75% improvement in R thigh and knee pain to improve QOL. Baseline: 4/10 constant pain, at worst up to 7/10 Goal status: IN PROGRESS - 09/21/23 - Pt reports 60% improvement in pain since start of PT  3.  Patient will demonstrate improved R knee AROM to >/= 0-110 deg to allow for normal gait and stair mechanics. Baseline: R knee AROM 0-104 Goal status: IN PROGRESS  4.  Patient will demonstrate improved B LE strength to >/= 4 to 4+/5 for improved stability and ease of mobility. Baseline: Refer to above LE MMT table Goal status: IN PROGRESS  5.  Patient will be able to ambulate with LRAD and normal gait pattern at gait speed of >/= 1.8 ft/sec without increased pain for safe limited community access with decreased risk for  recurrent falls.  Baseline: 0.93 ft/sec Goal status: IN PROGRESS  6. Patient will be able to ascend/descend stairs with 1 HR and reciprocal step pattern safely to access home and community.  Baseline: Step-to/alternating pattern with SPC and 1 HR with CGA of PT  Goal status: IN PROGRESS  7.  Patient will report >/= 29/80 on LEFS (MCID = 9 pts) to demonstrate improved functional ability. Baseline: 20 / 80 = 25.0 % Goal status: IN PROGRESS- 09/25/23 (39 / 80 = 48.8 %)  8.  Patient will demonstrate at least 15/24 on DGI to decrease risk of falls. Baseline: 7/24 Goal status: IN PROGRESS    PLAN:  PT FREQUENCY: 2x/week  PT DURATION: 8 weeks  PLANNED INTERVENTIONS: 97164- PT Re-evaluation, 97110-Therapeutic exercises, 97530- Therapeutic activity, 97112- Neuromuscular re-education, 97535- Self Care, 29562- Manual therapy, [id number]- Gait training, [phone]- Aquatic Therapy, 97014- Electrical stimulation (unattended), [phone]- Electrical stimulation (manual), 97016- Vasopneumatic device, [id number]- Ultrasound, [id number]- Ionotophoresis 4mg /ml Dexamethasone, Patient/Family education, Balance training, Stair training, Taping, Dry Needling, Joint mobilization, Scar mobilization, DME instructions, Cryotherapy, and Moist heat  PLAN FOR NEXT SESSION: Progress proximal LE flexibility (work on hip flexor stretches) and strengthening - update HEP accordingly; MT +/- DN to address abnormal muscle tension in R lateral hamstrings and quads   Sherice Ijames Caprice Red, PTA 09/28/2023, 2:51 PM     Date of referral: 07/30/2023 Referring provider: Bill Salinas, PA-C Referring diagnosis? M25.561 (ICD-10-CM) - Right knee pain / R LE hamstring strain (biceps femoris) Treatment diagnosis? (if different than referring diagnosis)  Pain in right thigh  Chronic pain of right knee  Muscle weakness (generalized)  Other muscle spasm  Other abnormalities of gait and mobility  What was this (referring dx) caused by? Ongoing  Issue and Arthritis  Nature of Condition: Chronic (continuous duration > 3 months)   Laterality: Rt  Current Functional Measure Score: LEFS 39 / 80 = 48.8 %  Objective measurements identify impairments when they are compared to normal values, the uninvolved extremity, and prior level of function.  [x]  Yes  []  No  Objective assessment of functional ability: Moderate functional limitations   Briefly describe symptoms: Patient reports onset of R posterior lateral thigh pain beginning ~6-8 months ago without known MOI, however thinks he may recall having had a "charley horse" in his hamstring at some point.  He also notes ongoing intermittent R knee pain since his R TKA in 2017.  He is unable to identify aggravating factors, stating pain is "just there", nor is  he able to find anything that gives him significant relief from pain. Patient has deficits in B knee ROM, B proximal LE flexibility, BLE strength, abnormal posture and gait mechanics, and TTP with abnormal muscle tension in R lateral hamstring which are interfering with ADLs and are impacting quality of life.  On LEFS patient scored 20/80 demonstrating 75% disability.  Gait speed significantly decreased at 0.93 ft/sec with SPC placing him at a household ambulator level with an elevated risk for recurrent falls, therefore we will plan for further standardized balance testing next visit.    How did symptoms start: uncertain, possibly muscle cramp  Average pain intensity:  Last 24 hours: 5/10  Past week: 5/10  How often does the pt experience symptoms? Constantly  How much have the symptoms interfered with usual daily activities? Moderately  How has condition changed since care began at this facility? A little better  In general, how is the patients overall health? Fair  Onset date: ~6-8 months   BACK PAIN (STarT Back Screening Tool) - (When applicable): N/A  Has your back pain spread down your leg(s) at sometime in the last 2  weeks? []  Yes   []  No Have you had pain in the shoulder or neck at sometime in the past 2 weeks? []  Yes   []  No Have you only walked short distances because of your back pain? []  Yes   []  No In the past 2 weeks, have you dressed more slowly than usual because of your back pain? []  Yes   []  No Do you think it is not really safe for person with a condition like yours to be physically active? []  Yes   []  No Have worrying thoughts been going through your mind a lot of the time? []  Yes   []  No Do you feel that your back pain is terrible and it is never going to get any better? []  Yes   []  No In general, have you stopped enjoying all the things you usually enjoy? []  Yes   []  No Overall, how bothersome has your back pain been in the last 2 weeks? []  Not at all   []  Slightly     []  Moderate   []  Very much     []  Extremely

## 2023-10-02 ENCOUNTER — Ambulatory Visit: Payer: Medicare Other | Admitting: Physical Therapy

## 2023-10-06 ENCOUNTER — Ambulatory Visit: Payer: Medicare Other | Admitting: Physical Therapy

## 2023-10-06 ENCOUNTER — Encounter: Payer: Self-pay | Admitting: Physical Therapy

## 2023-10-06 DIAGNOSIS — M79651 Pain in right thigh: Secondary | ICD-10-CM

## 2023-10-06 DIAGNOSIS — M62838 Other muscle spasm: Secondary | ICD-10-CM

## 2023-10-06 DIAGNOSIS — R2689 Other abnormalities of gait and mobility: Secondary | ICD-10-CM

## 2023-10-06 DIAGNOSIS — G8929 Other chronic pain: Secondary | ICD-10-CM

## 2023-10-06 DIAGNOSIS — M6281 Muscle weakness (generalized): Secondary | ICD-10-CM

## 2023-10-06 NOTE — Therapy (Signed)
 OUTPATIENT PHYSICAL THERAPY TREATMENT   Patient Name: Seth [name] MRN: [id number] DOB:July 22, 1941, 83 y.o., male Today's Date: 10/06/2023   END OF SESSION:  PT End of Session - 10/06/23 1401     Visit Number 8    Date for PT Re-Evaluation 10/26/23    Authorization Type [employer]    Authorization Time Period 09/28/23 - 10/26/23    Authorization - Visit Number 2    Authorization - Number of Visits 8    Progress Note Due on Visit 10    PT Start Time 1401    PT Stop Time 1445    PT Time Calculation (min) 44 min    Activity Tolerance Patient tolerated treatment well    Behavior During Therapy [facility] for tasks assessed/performed                    Past Medical History:  Diagnosis Date   Arthritis    Cancer (HCC)    hx of prostate cancer    History of kidney stones    Hypertension    Pneumonia    06/2015    Past Surgical History:  Procedure Laterality Date   PROSTATECTOMY     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   Procedure: RIGHT TOTAL KNEE ARTHROPLASTY;  Surgeon: Ollen Gross, MD;  Location: [facility];  Service: Orthopedics;  Laterality: Right;   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   Procedure: TOTAL KNEE ARTHROPLASTY;  Surgeon: Ollen Gross, MD;  Location: [facility];  Service: Orthopedics;  Laterality: Left;    Patient Active Problem List   Diagnosis Date Noted   Sepsis (HCC) 04/13/2020   Pneumonia due to COVID-19 virus 04/13/2020   Acute hypoxemic respiratory failure (HCC) 04/13/2020   Elevated troponin 04/13/2020   Syncope 08/30/2018   Essential hypertension 08/30/2018   AKI (acute kidney injury) (HCC) 08/30/2018   Hyponatremia 08/30/2018   Volume depletion 08/30/2018   OA (osteoarthritis) of knee 09/10/2015    PCP: Drosinis, Leonia Reader, PA-C   REFERRING PROVIDER: Bill Salinas, PA-C   REFERRING DIAG: [phone] (ICD-10-CM) - Right knee pain / R LE hamstring strain (biceps femoris)  THERAPY DIAG:  Pain in right thigh  Chronic pain of  right knee  Muscle weakness (generalized)  Other muscle spasm  Other abnormalities of gait and mobility  RATIONALE FOR EVALUATION AND TREATMENT: Rehabilitation  ONSET DATE: ~6-8 months  NEXT MD VISIT: PRN   SUBJECTIVE:  SUBJECTIVE STATEMENT: Pt denies any recent HS pain, just notes the ongoing R knee pain.   EVAL:  Pt reports pain in R posterior lateral thigh for the past 6-8 months w/o known MOI.  He states he feels like at one point in time he had a "charley horse" in his R thigh.  He notes constant pain at ~4/10.  He states his R knee acts up at times since his TKA.  PAIN: Are you having pain? Yes: NPRS scale: 3/10  Pain location: R knee Pain description: tenderness  Aggravating factors: "just there" Relieving factors: nothing  PERTINENT HISTORY:  L TKA 2020, R TKA 2017, OA, HTN, prostate cancer  PRECAUTIONS: Fall  RED FLAGS: None  WEIGHT BEARING RESTRICTIONS: No  FALLS:  Has patient fallen in last 6 months? No, but FOF  LIVING ENVIRONMENT: Lives with: lives with their family Lives in: House/apartment Stairs: Yes: Internal: 14 steps; to basement (no need to access) and External: 2 steps; on right going up, on left going up, and can reach both Has following equipment at home: Single point cane and Grab bars  OCCUPATION: Retired  PLOF: Independent and Leisure: mostly sedentary - look at [employer], work in the yard when the weather is nicer  PATIENT GOALS: "To use my R leg completely."   OBJECTIVE: (objective measures completed at initial evaluation unless otherwise dated)  DIAGNOSTIC FINDINGS:  07/30/23 - Peripheral venous US - R LE: IMPRESSION: No evidence of deep vein thrombosis.   PATIENT SURVEYS:  LEFS 20 / 80 = 25.0 %  COGNITION: Overall cognitive status:  Within functional limits for tasks assessed    SENSATION: WFL  EDEMA:  N/A  POSTURE:  rounded shoulders, forward head, flexed trunk , and genu valgum bilaterally  PALPATION: Increased muscle tension and TTP along R lateral HS  MUSCLE LENGTH: Hamstrings: mild/mod tight R>L ITB: mod tight L>R Piriformis: WFL Hip IR: mod tight R>L Hip flexors: mod tight R>L Quads: mod tight R>L  LOWER EXTREMITY ROM:  Active ROM Right eval Left eval R 10/06/23 L 10/06/23  Knee flexion 104 110 110 110  Knee extension 0 0     Passive ROM Right eval Left eval  Knee flexion 110 110  Knee extension    (Blank rows = not tested)  LOWER EXTREMITY MMT:  MMT Right eval Left eval R 10/06/23 L 10/06/23  Hip flexion 3+ 4 4+ 4+  Hip extension 2+ 3- 3+ 3+  Hip abduction 2+ 3+ 3+ 4-  Hip adduction 3- 4- 3+ 4-  Hip internal rotation 3- 4- 3+ 4-  Hip external rotation 4- 4 4- 4+  Knee flexion 4- 4+ 4 5  Knee extension 4 4+ 4 p! 5  Ankle dorsiflexion 4- 4+ 4 5  Ankle plantarflexion      Ankle inversion      Ankle eversion       (Blank rows = not tested)  FUNCTIONAL TESTS:  5 times sit to stand: 20.35 sec (w/o UE assist), >15 sec indicates recurrent fall risk Timed up and go (TUG): 21.94 sec with SPC, >13.5 sec indicates high risk for falls 10 meter walk test: 35.09 sec with SPC, 0.93 ft/sec with SPC Berg Balance Scale: 37/56, 37-45 significant risk for falls (>80%)  Dynamic Gait Index: 7/24, Scores of 19 or less are predicitve of falls in older community living adults.  GAIT: Distance walked: Clinic distances Assistive device utilized: Single point cane Level of assistance: Modified independence Gait pattern: decreased stride length, knee flexed  in stance- Right, knee flexed in stance- Left, shuffling, trunk flexed, and narrow BOS Comments: Stooped posture with genu valgum during gait   TODAY'S TREATMENT:   10/06/23 THERAPEUTIC EXERCISE: To improve strength, endurance, and ROM.  Demonstration,  verbal and tactile cues throughout for technique.  NuStep - L5 x 6 min (B UE/LE) for muscle warm-up to promote tissue perfusion Seated R GTB HS curls 2 x 10 Seated B hip ABD/ER clam with looped GTB at knees 2 x 10  LE MMT  NEUROMUSCULAR RE-EDUCATION: To improve balance, coordination, kinesthesia, posture, and proprioception.  Sit to stand + GTB hip ABD isometric x 10 Sit to stand + GTB hip ABD isometric with L foot on 2" block x 10 to increase R LE workload Sit to stand + GTB hip ABD isometric with L foot on Airex pad x 5 to increase R LE workload B side-stepping with looped GTB at knees 4 x 8 ft in front of mat table - cues to lift leg out to the side rather than slide foot - 1 LOB requiring unplanned return to sitting on mat table   09/28/23 THERAPEUTIC EXERCISE: To improve strength, endurance, ROM, and flexibility.  Demonstration, verbal and tactile cues throughout for technique.  NuStep - L5 x 6 min (B UE/LE) for muscle warm-up to promote tissue perfusion 5xSTS- 18 seconds Standing hip abduction x 10 - cues to avoid hip hiking Standing hip extension x 10- cues for upright posture Seated hamstring curl RTB 2x15 RLE Seated LAQ 3lb 2x10 Seated hamstring stretch Standing toe tap to 6' step  Manual Therapy: to decrease muscle spasm, pain and improve mobility.  Gentle STM to R distal lateral hamstrings    09/25/23 THERAPEUTIC EXERCISE: To improve strength, endurance, ROM, and flexibility.  Demonstration, verbal and tactile cues throughout for technique.  NuStep - L5 x 6 min (B UE/LE) for muscle warm-up to promote tissue perfusion Supine hip ER/IR x 10 both ways Supine clams GTB x 10  Supine marching GTB x 10 Seated LAQ 2# 2x10 BLE Seated marching 2# 2x10 BLE   THERAPEUTIC ACTIVITIES: To improve functional performance.  Demonstration, verbal and tactile cues throughout for technique. LEFS: 39 / 80 = 48.8 % Sit to stands x 10 - cues to scoot forward in chair Standing hip extension  x 10 bil- cues for posture Standing hip abduction x 10 bil   PATIENT EDUCATION:  Education details: HEP update  Person educated: Patient Education method: Explanation, Demonstration, Tactile cues, Verbal cues, and Handouts Education comprehension: verbalized understanding, returned demonstration, verbal cues required, tactile cues required, and needs further education  HOME EXERCISE PROGRAM: Access Code: [email or web address] URL: [email or web address] Date: 10/06/2023 Prepared by: Glenetta Hew  Exercises - Seated Hamstring Stretch with Strap  - 2 x daily - 7 x weekly - 3 reps - 30 sec hold - Seated Hamstring Set  - 2 x daily - 7 x weekly - 2 sets - 10 reps - 3 sec hold - Seated Hip Adduction Isometrics with Ball  - 1 x daily - 7 x weekly - 2 sets - 10 reps - 3-5 sec hold - Seated Isometric Hip Abduction with Resistance  - 1 x daily - 7 x weekly - 2 sets - 10 reps - 3 sec hold - Seated March with Resistance  - 1 x daily - 7 x weekly - 2 sets - 10 reps - 3 sec hold - Seated Hamstring Curls with Resistance  - 1 x daily - 7 x weekly -  2 sets - 10 reps - 3 sec hold - Seated Knee Extension with Resistance  - 1 x daily - 7 x weekly - 2 sets - 10 reps - 3 sec hold - Sit to Stand with Resistance Around Legs  - 1 x daily - 3-4 x weekly - 2 sets - 10 reps - Side Stepping with Resistance at Thighs and Counter Support  - 1 x daily - 3-4 x weekly - 2 sets - 10 reps - 3 sec hold   ASSESSMENT:  CLINICAL IMPRESSION: Monty reports his HS pain has not been an issue recently but still has chronic R knee pain.  R knee ROM improved and now symmetrical to L knee - LTG #3 met.  MMT revealing gains in most LE muscle groups but R LE remains weaker than L for all but hip flexor muscle groups.  With STS he continues to rely predominantly on L LE therefore progressed LE strengthening with emphasis on lateral stability and increasing functional use of R LE during transitional movement.  Dusty will benefit from  continued skilled PT to address ongoing deficits and impairments to improve mobility and activity tolerance with decreased pain interference.  OBJECTIVE IMPAIRMENTS: Abnormal gait, decreased activity tolerance, decreased balance, decreased endurance, decreased knowledge of condition, decreased knowledge of use of DME, decreased mobility, difficulty walking, decreased ROM, decreased strength, decreased safety awareness, hypomobility, increased fascial restrictions, impaired perceived functional ability, increased muscle spasms, impaired flexibility, improper body mechanics, postural dysfunction, and pain.   ACTIVITY LIMITATIONS: carrying, lifting, bending, sitting, standing, squatting, sleeping, stairs, transfers, bed mobility, and locomotion level  PARTICIPATION LIMITATIONS: meal prep, cleaning, laundry, driving, shopping, community activity, and yard work  PERSONAL FACTORS: Age, Fitness, Past/current experiences, Time since onset of injury/illness/exacerbation, and 3+ comorbidities: L TKA 2020, R TKA 2017, OA, HTN, prostate cancer  are also affecting patient's functional outcome.   REHAB POTENTIAL: Good  CLINICAL DECISION MAKING: Evolving/moderate complexity  EVALUATION COMPLEXITY: Moderate   GOALS: Goals reviewed with patient? Yes  SHORT TERM GOALS: Target date: 09/28/2023  Patient will be independent with initial HEP. Baseline:  Goal status: IN PROGRESS - 09/21/23 - cues still necessary for proper posture and movement patterns  2.  Patient will report at least 25% improvement in R thigh and knee pain to improve QOL. Baseline: 4/10 constant pain, at worst up to 7/10 Goal status: MET - 09/21/23 - Pt reports 60% improvement in pain since start of PT  3.  Complete standardized balance testing and update POC/goals as indicated. Baseline:  Goal status: MET - 09/07/23  4.  Patient will improve 5x STS time to </= 15 seconds to demonstrate improved functional strength and transfer efficiency   Baseline: 20.35 sec Goal status: IN PROGRESS - 09/28/23 - 18 seconds   LONG TERM GOALS: Target date: 10/26/2023  Patient will be independent with advanced/ongoing HEP to improve outcomes and carryover.  Baseline:  Goal status: IN PROGRESS  2.  Patient will report at least 50-75% improvement in R thigh and knee pain to improve QOL. Baseline: 4/10 constant pain, at worst up to 7/10 Goal status: IN PROGRESS - 09/21/23 - Pt reports 60% improvement in pain since start of PT  3.  Patient will demonstrate improved R knee AROM to >/= 0-110 deg to allow for normal gait and stair mechanics. Baseline: R knee AROM 0-104 Goal status: MET - 10/06/23  4.  Patient will demonstrate improved B LE strength to >/= 4 to 4+/5 for improved stability and ease of  mobility. Baseline: Refer to above LE MMT table Goal status: IN PROGRESS - 10/06/23 - improving and partially met for L LE, but R LE weaker than L LE for all muscle groups except hip flexors  5.  Patient will be able to ambulate with LRAD and normal gait pattern at gait speed of >/= 1.8 ft/sec without increased pain for safe limited community access with decreased risk for recurrent falls.  Baseline: 0.93 ft/sec Goal status: IN PROGRESS  6. Patient will be able to ascend/descend stairs with 1 HR and reciprocal step pattern safely to access home and community.  Baseline: Step-to/alternating pattern with SPC and 1 HR with CGA of PT  Goal status: IN PROGRESS  7.  Patient will report >/= 29/80 on LEFS (MCID = 9 pts) to demonstrate improved functional ability. Baseline: 20 / 80 = 25.0 % Goal status: IN PROGRESS - 09/25/23 - 39 / 80 = 48.8 %  8.  Patient will demonstrate at least 15/24 on DGI to decrease risk of falls. Baseline: 7/24 Goal status: IN PROGRESS    PLAN:  PT FREQUENCY: 2x/week  PT DURATION: 8 weeks  PLANNED INTERVENTIONS: 97164- PT Re-evaluation, 97110-Therapeutic exercises, 97530- Therapeutic activity, 97112- Neuromuscular  re-education, 97535- Self Care, 09811- Manual therapy, [id number]- Gait training, [id number]- Aquatic Therapy, 97014- Electrical stimulation (unattended), [phone]- Electrical stimulation (manual), 97016- Vasopneumatic device, 97035- Ultrasound, 13086- Ionotophoresis 4mg /ml Dexamethasone, Patient/Family education, Balance training, Stair training, Taping, Dry Needling, Joint mobilization, Scar mobilization, DME instructions, Cryotherapy, and Moist heat  PLAN FOR NEXT SESSION: Progress proximal LE flexibility (work on hip flexor stretches) and strengthening with functional emphasis - update HEP accordingly; MT +/- DN to address abnormal muscle tension in R lateral hamstrings and quads   Marry Guan, PT 10/06/2023, 2:50 PM     Date of referral: 07/30/2023 Referring provider: Bill Salinas, PA-C Referring diagnosis? M25.561 (ICD-10-CM) - Right knee pain / R LE hamstring strain (biceps femoris) Treatment diagnosis? (if different than referring diagnosis)  Pain in right thigh  Chronic pain of right knee  Muscle weakness (generalized)  Other muscle spasm  Other abnormalities of gait and mobility  What was this (referring dx) caused by? Ongoing Issue and Arthritis  Nature of Condition: Chronic (continuous duration > 3 months)   Laterality: Rt  Current Functional Measure Score: LEFS 39 / 80 = 48.8 %  Objective measurements identify impairments when they are compared to normal values, the uninvolved extremity, and prior level of function.  [x]  Yes  []  No  Objective assessment of functional ability: Moderate functional limitations   Briefly describe symptoms: Patient reports onset of R posterior lateral thigh pain beginning ~6-8 months ago without known MOI, however thinks he may recall having had a "charley horse" in his hamstring at some point.  He also notes ongoing intermittent R knee pain since his R TKA in 2017.  He is unable to identify aggravating factors, stating pain is "just there",  nor is he able to find anything that gives him significant relief from pain. Patient has deficits in B knee ROM, B proximal LE flexibility, BLE strength, abnormal posture and gait mechanics, and TTP with abnormal muscle tension in R lateral hamstring which are interfering with ADLs and are impacting quality of life.  On LEFS patient scored 20/80 demonstrating 75% disability.  Gait speed significantly decreased at 0.93 ft/sec with SPC placing him at a household ambulator level with an elevated risk for recurrent falls, therefore we will plan for further standardized balance testing next  visit.    How did symptoms start: uncertain, possibly muscle cramp  Average pain intensity:  Last 24 hours: 5/10  Past week: 5/10  How often does the pt experience symptoms? Constantly  How much have the symptoms interfered with usual daily activities? Moderately  How has condition changed since care began at this facility? A little better  In general, how is the patients overall health? Fair  Onset date: ~6-8 months   BACK PAIN (STarT Back Screening Tool) - (When applicable): N/A  Has your back pain spread down your leg(s) at sometime in the last 2 weeks? []  Yes   []  No Have you had pain in the shoulder or neck at sometime in the past 2 weeks? []  Yes   []  No Have you only walked short distances because of your back pain? []  Yes   []  No In the past 2 weeks, have you dressed more slowly than usual because of your back pain? []  Yes   []  No Do you think it is not really safe for person with a condition like yours to be physically active? []  Yes   []  No Have worrying thoughts been going through your mind a lot of the time? []  Yes   []  No Do you feel that your back pain is terrible and it is never going to get any better? []  Yes   []  No In general, have you stopped enjoying all the things you usually enjoy? []  Yes   []  No Overall, how bothersome has your back pain been in the last 2 weeks? []  Not at  all   []  Slightly     []  Moderate   []  Very much     []  Extremely

## 2023-10-12 ENCOUNTER — Ambulatory Visit: Payer: Medicare Other | Attending: Medical

## 2023-10-12 DIAGNOSIS — R2689 Other abnormalities of gait and mobility: Secondary | ICD-10-CM | POA: Insufficient documentation

## 2023-10-12 DIAGNOSIS — M6281 Muscle weakness (generalized): Secondary | ICD-10-CM | POA: Insufficient documentation

## 2023-10-12 DIAGNOSIS — M25561 Pain in right knee: Secondary | ICD-10-CM | POA: Insufficient documentation

## 2023-10-12 DIAGNOSIS — M79651 Pain in right thigh: Secondary | ICD-10-CM | POA: Insufficient documentation

## 2023-10-12 DIAGNOSIS — M62838 Other muscle spasm: Secondary | ICD-10-CM | POA: Insufficient documentation

## 2023-10-12 DIAGNOSIS — G8929 Other chronic pain: Secondary | ICD-10-CM | POA: Insufficient documentation

## 2023-10-19 ENCOUNTER — Ambulatory Visit: Payer: Medicare Other

## 2023-10-23 ENCOUNTER — Ambulatory Visit: Payer: Medicare Other | Admitting: Physical Therapy

## 2023-10-23 DIAGNOSIS — M79651 Pain in right thigh: Secondary | ICD-10-CM | POA: Diagnosis present

## 2023-10-23 DIAGNOSIS — M25561 Pain in right knee: Secondary | ICD-10-CM | POA: Diagnosis present

## 2023-10-23 DIAGNOSIS — M6281 Muscle weakness (generalized): Secondary | ICD-10-CM | POA: Diagnosis present

## 2023-10-23 DIAGNOSIS — R2689 Other abnormalities of gait and mobility: Secondary | ICD-10-CM

## 2023-10-23 DIAGNOSIS — M62838 Other muscle spasm: Secondary | ICD-10-CM | POA: Diagnosis present

## 2023-10-23 DIAGNOSIS — G8929 Other chronic pain: Secondary | ICD-10-CM

## 2023-10-23 NOTE — Therapy (Addendum)
 OUTPATIENT PHYSICAL THERAPY TREATMENT / RECERTIFICATION   Progress Note  Reporting Period 08/31/2023 to 10/23/2023   See note below for Objective Data and Assessment of Progress/Goals.     Patient Name: Seth [name] MRN: [id number] DOB:08-16-1940, 83 y.o., male Today's Date: 10/23/2023   END OF SESSION:  PT End of Session - 10/23/23 1103     Visit Number 9    Date for PT Re-Evaluation 12/04/23    Authorization Type [employer] Medicare    Authorization Time Period 09/28/23 - 10/26/23    Authorization - Visit Number 3    Authorization - Number of Visits 8    Progress Note Due on Visit 19   Recert/PN on visit #9 - 10/23/23   PT Start Time 1103    PT Stop Time 1154    PT Time Calculation (min) 51 min    Activity Tolerance Patient tolerated treatment well    Behavior During Therapy [facility] for tasks assessed/performed               Past Medical History:  Diagnosis Date   Arthritis    Cancer (HCC)    hx of prostate cancer    History of kidney stones    Hypertension    Pneumonia    06/2015    Past Surgical History:  Procedure Laterality Date   PROSTATECTOMY     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   Procedure: RIGHT TOTAL KNEE ARTHROPLASTY;  Surgeon: Ollen Gross, MD;  Location: [facility];  Service: Orthopedics;  Laterality: Right;   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   Procedure: TOTAL KNEE ARTHROPLASTY;  Surgeon: Ollen Gross, MD;  Location: [facility];  Service: Orthopedics;  Laterality: Left;    Patient Active Problem List   Diagnosis Date Noted   Sepsis (HCC) 04/13/2020   Pneumonia due to COVID-19 virus 04/13/2020   Acute hypoxemic respiratory failure (HCC) 04/13/2020   Elevated troponin 04/13/2020   Syncope 08/30/2018   Essential hypertension 08/30/2018   AKI (acute kidney injury) (HCC) 08/30/2018   Hyponatremia 08/30/2018   Volume depletion 08/30/2018   OA (osteoarthritis) of knee 09/10/2015    PCP: Drosinis, Leonia Reader, PA-C   REFERRING PROVIDER:  Elizabeth Palau / Ollen Gross, MD  REFERRING DIAG: [phone] (ICD-10-CM) - Right knee pain / R LE hamstring strain (biceps femoris)  THERAPY DIAG:  Chronic pain of right knee  Pain in right thigh  Muscle weakness (generalized)  Other abnormalities of gait and mobility  Other muscle spasm  RATIONALE FOR EVALUATION AND TREATMENT: Rehabilitation  ONSET DATE: ~6-8 months  NEXT MD VISIT: 11/20/23   SUBJECTIVE:  SUBJECTIVE STATEMENT: Pt reports ongoing R knee pain but states the thigh/HS pain seems to come and go.  New order received from Dr. Lequita Halt as of 10/09/2023 to continue PT 2x/wk for 4-6 weeks (patient returning today for first visit since 10/06/2023).  EVAL:  Pt reports pain in R posterior lateral thigh for the past 6-8 months w/o known MOI.  He states he feels like at one point in time he had a "charley horse" in his R thigh.  He notes constant pain at ~4/10.  He states his R knee acts up at times since his TKA.  PAIN: Are you having pain? Yes: NPRS scale: 6/10   Pain location: R knee Pain description: tenderness  Aggravating factors: "just there" Relieving factors: nothing  PERTINENT HISTORY:  L TKA 2020, R TKA 2017, OA, HTN, prostate cancer  PRECAUTIONS: Fall  RED FLAGS: None  WEIGHT BEARING RESTRICTIONS: No  FALLS:  Has patient fallen in last 6 months? No, but FOF  LIVING ENVIRONMENT: Lives with: lives with their family Lives in: House/apartment Stairs: Yes: Internal: 14 steps; to basement (no need to access) and External: 2 steps; on right going up, on left going up, and can reach both Has following equipment at home: Single point cane and Grab bars  OCCUPATION: Retired  PLOF: Independent and Leisure: mostly sedentary - look at [employer], work in the yard  when the weather is nicer  PATIENT GOALS: "To use my R leg completely."   OBJECTIVE: (objective measures completed at initial evaluation unless otherwise dated)  DIAGNOSTIC FINDINGS:  07/30/23 - Peripheral venous US - R LE: IMPRESSION: No evidence of deep vein thrombosis.   PATIENT SURVEYS:  LEFS 20 / 80 = 25.0 %  COGNITION: Overall cognitive status: Within functional limits for tasks assessed    SENSATION: WFL  EDEMA:  N/A  POSTURE:  rounded shoulders, forward head, flexed trunk , and genu valgum bilaterally  PALPATION: Increased muscle tension and TTP along R lateral HS  MUSCLE LENGTH: Hamstrings: mild/mod tight R>L ITB: mod tight L>R Piriformis: WFL Hip IR: mod tight R>L Hip flexors: mod tight R>L Quads: mod tight R>L  LOWER EXTREMITY ROM:  Active ROM Right eval Left eval R 10/06/23 L 10/06/23  Knee flexion 104 110 110 110  Knee extension 0 0     Passive ROM Right eval Left eval  Knee flexion 110 110  Knee extension    (Blank rows = not tested)  LOWER EXTREMITY MMT:  MMT Right eval Left eval R 10/06/23 L 10/06/23  Hip flexion 3+ 4 4+ 4+  Hip extension 2+ 3- 3+ 3+  Hip abduction 2+ 3+ 3+ 4-  Hip adduction 3- 4- 3+ 4-  Hip internal rotation 3- 4- 3+ 4-  Hip external rotation 4- 4 4- 4+  Knee flexion 4- 4+ 4 5  Knee extension 4 4+ 4 p! 5  Ankle dorsiflexion 4- 4+ 4 5  Ankle plantarflexion      Ankle inversion      Ankle eversion       (Blank rows = not tested)  FUNCTIONAL TESTS:  5 times sit to stand: 20.35 sec (w/o UE assist), >15 sec indicates recurrent fall risk Timed up and go (TUG): 21.94 sec with SPC, >13.5 sec indicates high risk for falls 10 meter walk test: 35.09 sec with SPC, 0.93 ft/sec with SPC Berg Balance Scale: 37/56, 37-45 significant risk for falls (>80%)  Dynamic Gait Index: 7/24, Scores of 19 or less are predicitve of falls in  older community living adults.  10/23/23: 5xSTS: 13.79 sec (w/o UE assist) TUG: 21.03 sec with SPC,  >13.5 sec indicates high risk for falls : 22.81 sec with SPC; 26.03 sec w/o AD Gait speed: 1.44 ft/sec with SPC; 1.26 ft/sec DGI: 10/24, Scores of 19 or less are predicitve of falls in older community living adults.  GAIT: Distance walked: Clinic distances Assistive device utilized: Single point cane Level of assistance: Modified independence Gait pattern: decreased stride length, knee flexed in stance- Right, knee flexed in stance- Left, shuffling, trunk flexed, and narrow BOS Comments: Stooped posture with genu valgum during gait   TODAY'S TREATMENT:   10/23/23 THERAPEUTIC EXERCISE: To improve strength, endurance, ROM, and flexibility.  Demonstration, verbal and tactile cues throughout for technique.  Rec Bike - L1 x 6 min - R foot repeated slipping off pedal  THERAPEUTIC ACTIVITIES: To improve functional performance.  Demonstration, verbal and tactile cues throughout for technique. 5xSTS: 13.79 sec (w/o UE assist) TUG: 21.03 sec with SPC, >13.5 sec indicates high risk for falls : 22.81 sec with SPC; 26.03 sec w/o AD Gait speed: 1.44 ft/sec with SPC; 1.26 ft/sec; gait speed of <1.8 ft/sec indicative of recurrent fall risk DGI: 10/24, Scores of 19 or less are predicitve of falls in older community living adults. Dynamic Gait Index  Level Surface Mild Impairment   Change in Gait Speed Mild Impairment   Gait with Horizontal Head Turns Moderate Impairment   Gait with Vertical Head Turns Moderate Impairment   Gait and Pivot Turn Moderate Impairment   Step Over Obstacle Severe Impairment   Step Around Obstacles Mild Impairment   Steps Moderate Impairment   Total Score 10      GAIT TRAINING: To normalize gait pattern and improve safety with SPC .  200 ft with SPC - cues provided to try SPC in L hand to offset R LE (patient normally using cane in R hand) -cues necessary for proper sequencing as well as full symmetrical stride length Stairs: Level of Assistance: SBA and  CGA Stair Negotiation Technique: Step to Pattern/Alternating Pattern Forwards With use of AD: SPC with Single Rail on Right Number of Stairs: 14  Height of Stairs: 7"  Comments: Cues to ensure full foot placement on step as patient has tendency to have forefoot/half of foot hanging over edge of step on descent > ascent Near fall due to LOB with SPC upon exiting stairwell, requiring PT assist to correct LOB/prevent fall   10/06/23 THERAPEUTIC EXERCISE: To improve strength, endurance, and ROM.  Demonstration, verbal and tactile cues throughout for technique.  NuStep - L5 x 6 min (B UE/LE) for muscle warm-up to promote tissue perfusion Seated R GTB HS curls 2 x 10 Seated B hip ABD/ER clam with looped GTB at knees 2 x 10  LE MMT  NEUROMUSCULAR RE-EDUCATION: To improve balance, coordination, kinesthesia, posture, and proprioception.  Sit to stand + GTB hip ABD isometric x 10 Sit to stand + GTB hip ABD isometric with L foot on 2" block x 10 to increase R LE workload Sit to stand + GTB hip ABD isometric with L foot on Airex pad x 5 to increase R LE workload B side-stepping with looped GTB at knees 4 x 8 ft in front of mat table - cues to lift leg out to the side rather than slide foot - 1 LOB requiring unplanned return to sitting on mat table   09/28/23 THERAPEUTIC EXERCISE: To improve strength, endurance, ROM, and flexibility.  Demonstration, verbal  and tactile cues throughout for technique.  NuStep - L5 x 6 min (B UE/LE) for muscle warm-up to promote tissue perfusion 5xSTS- 18 seconds Standing hip abduction x 10 - cues to avoid hip hiking Standing hip extension x 10- cues for upright posture Seated hamstring curl RTB 2x15 RLE Seated LAQ 3lb 2x10 Seated hamstring stretch Standing toe tap to 6' step  Manual Therapy: to decrease muscle spasm, pain and improve mobility.  Gentle STM to R distal lateral hamstrings    PATIENT EDUCATION:  Education details: progress with PT, ongoing PT POC,  and gait safety with SPC   Person educated: Patient Education method: Explanation, Demonstration, and Verbal cues Education comprehension: verbalized understanding, returned demonstration, verbal cues required, and needs further education  HOME EXERCISE PROGRAM: Access Code: [email or web address] URL: [email or web address] Date: 10/06/2023 Prepared by: Glenetta Hew  Exercises - Seated Hamstring Stretch with Strap  - 2 x daily - 7 x weekly - 3 reps - 30 sec hold - Seated Hamstring Set  - 2 x daily - 7 x weekly - 2 sets - 10 reps - 3 sec hold - Seated Hip Adduction Isometrics with Ball  - 1 x daily - 7 x weekly - 2 sets - 10 reps - 3-5 sec hold - Seated Isometric Hip Abduction with Resistance  - 1 x daily - 7 x weekly - 2 sets - 10 reps - 3 sec hold - Seated March with Resistance  - 1 x daily - 7 x weekly - 2 sets - 10 reps - 3 sec hold - Seated Hamstring Curls with Resistance  - 1 x daily - 7 x weekly - 2 sets - 10 reps - 3 sec hold - Seated Knee Extension with Resistance  - 1 x daily - 7 x weekly - 2 sets - 10 reps - 3 sec hold - Sit to Stand with Resistance Around Legs  - 1 x daily - 3-4 x weekly - 2 sets - 10 reps - Side Stepping with Resistance at Thighs and Counter Support  - 1 x daily - 3-4 x weekly - 2 sets - 10 reps - 3 sec hold   ASSESSMENT:  CLINICAL IMPRESSION: Seth [name] returns to PT today after >2 weeks absence.  During this interval he had his follow-up with Dr. Lequita Halt, where he reported improvement in his R thigh pain with PT and overall improvement in knee function, however ongoing anterior R knee discomfort.  Dr. Lequita Halt recommended an additional 2x/wk for 4-6 weeks of PT.  Reassessment of standardized balance testing revealing gains across all tests with 5xSTS time of 13.79 seconds meeting STG #4, however remaining standardized balance tests indicating continued high risk for falls including TUG time of 21.03 sec with SPC (>13.5 sec indicates high risk for falls), 10MWT/gait  speed of 1.44 ft/sec with SPC; 1.26 ft/sec; (gait speed of <1.8 ft/sec indicative of recurrent fall risk), DGI = 10/24 (scores of 19 or less are predicitve of falls in older community living adults).  These scores along with decreased safety noted with use of SPC indicate potential need for increased assistive device support - discussed findings with patient with recommendation that walker may be necessary if scores do not continue to improve.  Given above balance impairments and associated fall risk, as well as ongoing weakness identified at last visit, Seth [name] will benefit from recertification for continued skilled PT to address ongoing deficits and impairments to improve mobility and activity tolerance with decreased pain interference and decreased risk for falls, therefore  will recommend recert for additional 2x/wk for up to 4-6 weeks.   OBJECTIVE IMPAIRMENTS: Abnormal gait, decreased activity tolerance, decreased balance, decreased endurance, decreased knowledge of condition, decreased knowledge of use of DME, decreased mobility, difficulty walking, decreased ROM, decreased strength, decreased safety awareness, hypomobility, increased fascial restrictions, impaired perceived functional ability, increased muscle spasms, impaired flexibility, improper body mechanics, postural dysfunction, and pain.   ACTIVITY LIMITATIONS: carrying, lifting, bending, sitting, standing, squatting, sleeping, stairs, transfers, bed mobility, and locomotion level  PARTICIPATION LIMITATIONS: meal prep, cleaning, laundry, driving, shopping, community activity, and yard work  PERSONAL FACTORS: Age, Fitness, Past/current experiences, Time since onset of injury/illness/exacerbation, and 3+ comorbidities: L TKA 2020, R TKA 2017, OA, HTN, prostate cancer  are also affecting patient's functional outcome.   REHAB POTENTIAL: Good  CLINICAL DECISION MAKING: Evolving/moderate complexity  EVALUATION COMPLEXITY:  Moderate   GOALS: Goals reviewed with patient? Yes  SHORT TERM GOALS: Target date: 09/28/2023  Patient will be independent with initial HEP. Baseline:  Goal status: IN PROGRESS - 10/23/23 - patient reports he is uncertain about some of HEP exercises - will plan for review next visit  2.  Patient will report at least 25% improvement in R thigh and knee pain to improve QOL. Baseline: 4/10 constant pain, at worst up to 7/10 Goal status: MET - 09/21/23 - Pt reports 60% improvement in pain since start of PT  3.  Complete standardized balance testing and update POC/goals as indicated. Baseline:  Goal status: MET - 09/07/23  4.  Patient will improve 5x STS time to </= 15 seconds to demonstrate improved functional strength and transfer efficiency  Baseline: 20.35 sec; 09/28/23 - 18 sec Goal status: MET - 10/23/23 - 13.79 sec  LONG TERM GOALS: Target date: 10/26/2023; extended to 12/04/2023  Patient will be independent with advanced/ongoing HEP to improve outcomes and carryover.  Baseline:  Goal status: IN PROGRESS - 10/23/23 - patient reports he is uncertain about some of HEP exercises - will plan for review next visit  2.  Patient will report at least 50-75% improvement in R thigh and knee pain to improve QOL. Baseline: 4/10 constant pain, at worst up to 7/10 Goal status: IN PROGRESS -  09/21/23 - Pt reports 60% improvement in pain since start of PT  3.  Patient will demonstrate improved R knee AROM to >/= 0-110 deg to allow for normal gait and stair mechanics. Baseline: R knee AROM 0-104 Goal status: MET - 10/06/23  4.  Patient will demonstrate improved B LE strength to >/= 4 to 4+/5 for improved stability and ease of mobility. Baseline: Refer to above LE MMT table Goal status: IN PROGRESS - 10/06/23 - improving and partially met for L LE, but R LE weaker than L LE for all muscle groups except hip flexors  5.  Patient will be able to ambulate with LRAD and normal gait pattern at gait speed  of >/= 1.8 ft/sec without increased pain for safe limited community access with decreased risk for recurrent falls.  Baseline: 0.93 ft/sec with SPC Goal status: IN PROGRESS - 10/23/23 - 1.44 ft/sec with SPC; 1.26 ft/sec w/o AD  6. Patient will be able to ascend/descend stairs with 1 HR and reciprocal step pattern safely to access home and community.  Baseline: Step-to/alternating pattern with SPC and 1 HR with CGA of PT  Goal status: IN PROGRESS - 10/23/23 - Step-to/alternating pattern with SPC and 1 HR with CGA of PT  7.  Patient will report >/= 29/80 on  LEFS (MCID = 9 pts) to demonstrate improved functional ability. Baseline: 20 / 80 = 25.0 %  Goal status: MET - 09/25/23 - 39 / 80 = 48.8 %  8.  Patient will demonstrate at least 15/24 on DGI to decrease risk of falls. Baseline: 7/24 Goal status: IN PROGRESS - 10/23/23 - 10/24   PLAN:  PT FREQUENCY: 2x/week  PT DURATION: 8 weeks  PLANNED INTERVENTIONS: 62694- PT Re-evaluation, 97110-Therapeutic exercises, 97530- Therapeutic activity, 97112- Neuromuscular re-education, 97535- Self Care, [id number]- Manual therapy, [phone]- Gait training, [phone]- Aquatic Therapy, [phone]- Electrical stimulation (unattended), [phone]- Electrical stimulation (manual), [id number]- Vasopneumatic device, 97035- Ultrasound, [id number]- Ionotophoresis 4mg /ml Dexamethasone, Patient/Family education, Balance training, Stair training, Taping, Dry Needling, Joint mobilization, Scar mobilization, DME instructions, Cryotherapy, Moist heat, and 97750- Physical Performance Test or Measurement  PLAN FOR NEXT SESSION: Update [employer] Medicare smart phrase; complete Berg reassessment; review HEP; progress proximal LE flexibility (work on hip flexor stretches) and strengthening with functional emphasis - update HEP accordingly; MT +/- DN to address abnormal muscle tension in R lateral hamstrings and quads; potential trial of kinesiotaping or Iontopatch for R anterior knee pain   Marry Guan,  PT 10/23/2023, 3:03 PM     Date of referral: 07/30/2023 Referring provider: Bill Salinas, PA-C Referring diagnosis? M25.561 (ICD-10-CM) - Right knee pain / R LE hamstring strain (biceps femoris) Treatment diagnosis? (if different than referring diagnosis)  Chronic pain of right knee  Pain in right thigh  Muscle weakness (generalized)  Other abnormalities of gait and mobility  Other muscle spasm  What was this (referring dx) caused by? Ongoing Issue and Arthritis  Nature of Condition: Chronic (continuous duration > 3 months)   Laterality: Rt  Current Functional Measure Score: LEFS 39 / 80 = 48.8 %  Objective measurements identify impairments when they are compared to normal values, the uninvolved extremity, and prior level of function.  [x]  Yes  []  No  Objective assessment of functional ability: Moderate functional limitations   Briefly describe symptoms: Improvement noted in R thigh pain with PT and overall improvement in knee function/ROM, however ongoing anterior R knee discomfort.  Continued R>L proximal LE weakness.  Reassessment of standardized balance testing revealing gains across all tests with 5xSTS time of 13.79 seconds meeting STG #4, however remaining standardized balance tests indicating continued high risk for falls including TUG time of 21.03 sec with SPC (>13.5 sec indicates high risk for falls), 10MWT/gait speed of 1.44 ft/sec with SPC; 1.26 ft/sec; (gait speed of <1.8 ft/sec indicative of recurrent fall risk), DGI = 10/24 (scores of 19 or less are predicitve of falls in older community living adults).  These scores along with decreased safety noted with use of SPC indicate potential need for increased assistive device support - discussed findings with patient with recommendation that walker may be necessary if scores do not continue to improve.  Given above balance impairments and associated fall risk, as well as ongoing weakness identified at last visit, Seth [name]  will benefit from recertification for continued skilled PT to address ongoing deficits and impairments to improve mobility and activity tolerance with decreased pain interference and decreased risk for falls, therefore will recommend recert for additional 2x/wk for up to 4-6 weeks.   Initial symptoms: Patient reports onset of R posterior lateral thigh pain beginning ~6-8 months ago without known MOI, however thinks he may recall having had a "charley horse" in his hamstring at some point.  He also notes ongoing intermittent R knee pain since his R  TKA in 2017.  He is unable to identify aggravating factors, stating pain is "just there", nor is he able to find anything that gives him significant relief from pain. Patient has deficits in B knee ROM, B proximal LE flexibility, BLE strength, abnormal posture and gait mechanics, and TTP with abnormal muscle tension in R lateral hamstring which are interfering with ADLs and are impacting quality of life.  On LEFS patient scored 20/80 demonstrating 75% disability.  Gait speed significantly decreased at 0.93 ft/sec with SPC placing him at a household ambulator level with an elevated risk for recurrent falls, therefore we will plan for further standardized balance testing next visit.    How did symptoms start: uncertain, possibly muscle cramp  Average pain intensity:  Last 24 hours: 5/10  Past week: 5/10  How often does the pt experience symptoms? Constantly  How much have the symptoms interfered with usual daily activities? Moderately  How has condition changed since care began at this facility? A little better  In general, how is the patients overall health? Fair  Onset date: ~6-8 months   BACK PAIN (STarT Back Screening Tool) - (When applicable): N/A  Has your back pain spread down your leg(s) at sometime in the last 2 weeks? []  Yes   []  No Have you had pain in the shoulder or neck at sometime in the past 2 weeks? []  Yes   []  No Have you only  walked short distances because of your back pain? []  Yes   []  No In the past 2 weeks, have you dressed more slowly than usual because of your back pain? []  Yes   []  No Do you think it is not really safe for person with a condition like yours to be physically active? []  Yes   []  No Have worrying thoughts been going through your mind a lot of the time? []  Yes   []  No Do you feel that your back pain is terrible and it is never going to get any better? []  Yes   []  No In general, have you stopped enjoying all the things you usually enjoy? []  Yes   []  No Overall, how bothersome has your back pain been in the last 2 weeks? []  Not at all   []  Slightly     []  Moderate   []  Very much     []  Extremely

## 2023-10-26 ENCOUNTER — Encounter: Payer: Self-pay | Admitting: Physical Therapy

## 2023-10-26 ENCOUNTER — Ambulatory Visit: Payer: Medicare Other | Admitting: Physical Therapy

## 2023-10-26 DIAGNOSIS — M79651 Pain in right thigh: Secondary | ICD-10-CM

## 2023-10-26 DIAGNOSIS — M62838 Other muscle spasm: Secondary | ICD-10-CM

## 2023-10-26 DIAGNOSIS — M6281 Muscle weakness (generalized): Secondary | ICD-10-CM

## 2023-10-26 DIAGNOSIS — R2689 Other abnormalities of gait and mobility: Secondary | ICD-10-CM

## 2023-10-26 DIAGNOSIS — G8929 Other chronic pain: Secondary | ICD-10-CM

## 2023-10-26 DIAGNOSIS — M25561 Pain in right knee: Secondary | ICD-10-CM | POA: Diagnosis not present

## 2023-10-26 NOTE — Therapy (Signed)
 OUTPATIENT PHYSICAL THERAPY TREATMENT    Patient Name: Seth [name] MRN: [id number] DOB:11-Jul-1941, 83 y.o., male Today's Date: 10/26/2023   END OF SESSION:  PT End of Session - 10/26/23 1100     Visit Number 10    Date for PT Re-Evaluation 12/04/23    Authorization Type [employer]    Authorization Time Period 09/28/23 - 10/26/23    Authorization - Visit Number 4    Authorization - Number of Visits 8    Progress Note Due on Visit 19   Recert/PN on visit #9 - 10/23/23   PT Start Time 1100    PT Stop Time 1146    PT Time Calculation (min) 46 min    Activity Tolerance Patient tolerated treatment well    Behavior During Therapy [facility] for tasks assessed/performed                Past Medical History:  Diagnosis Date   Arthritis    Cancer (HCC)    hx of prostate cancer    History of kidney stones    Hypertension    Pneumonia    06/2015    Past Surgical History:  Procedure Laterality Date   PROSTATECTOMY     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   Procedure: RIGHT TOTAL KNEE ARTHROPLASTY;  Surgeon: Ollen Gross, MD;  Location: [facility];  Service: Orthopedics;  Laterality: Right;   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   Procedure: TOTAL KNEE ARTHROPLASTY;  Surgeon: Ollen Gross, MD;  Location: [facility];  Service: Orthopedics;  Laterality: Left;    Patient Active Problem List   Diagnosis Date Noted   Sepsis (HCC) 04/13/2020   Pneumonia due to COVID-19 virus 04/13/2020   Acute hypoxemic respiratory failure (HCC) 04/13/2020   Elevated troponin 04/13/2020   Syncope 08/30/2018   Essential hypertension 08/30/2018   AKI (acute kidney injury) (HCC) 08/30/2018   Hyponatremia 08/30/2018   Volume depletion 08/30/2018   OA (osteoarthritis) of knee 09/10/2015    PCP: Drosinis, Leonia Reader, PA-C   REFERRING PROVIDER: Elizabeth Palau / Ollen Gross, MD  REFERRING DIAG: [phone] (ICD-10-CM) - Right knee pain / R LE hamstring strain (biceps femoris)  THERAPY  DIAG:  Chronic pain of right knee  Pain in right thigh  Muscle weakness (generalized)  Other abnormalities of gait and mobility  Other muscle spasm  RATIONALE FOR EVALUATION AND TREATMENT: Rehabilitation  ONSET DATE: ~6-8 months  NEXT MD VISIT: 11/20/23   SUBJECTIVE:  SUBJECTIVE STATEMENT: Pt reports he swelled up some over there weekend when he tried to work on his HEP, but also states he was pretty active this weekend.  EVAL:  Pt reports pain in R posterior lateral thigh for the past 6-8 months w/o known MOI.  He states he feels like at one point in time he had a "charley horse" in his R thigh.  He notes constant pain at ~4/10.  He states his R knee acts up at times since his TKA.  PAIN: Are you having pain? Yes: NPRS scale: 5/10   Pain location: R knee Pain description: tenderness  Aggravating factors: "just there" Relieving factors: nothing  PERTINENT HISTORY:  L TKA 2020, R TKA 2017, OA, HTN, prostate cancer  PRECAUTIONS: Fall  RED FLAGS: None  WEIGHT BEARING RESTRICTIONS: No  FALLS:  Has patient fallen in last 6 months? No, but FOF  LIVING ENVIRONMENT: Lives with: lives with their family Lives in: House/apartment Stairs: Yes: Internal: 14 steps; to basement (no need to access) and External: 2 steps; on right going up, on left going up, and can reach both Has following equipment at home: Single point cane and Grab bars  OCCUPATION: Retired  PLOF: Independent and Leisure: mostly sedentary - look at [employer], work in the yard when the weather is nicer  PATIENT GOALS: "To use my R leg completely."   OBJECTIVE: (objective measures completed at initial evaluation unless otherwise dated)  DIAGNOSTIC FINDINGS:  07/30/23 - Peripheral venous US - R LE: IMPRESSION: No  evidence of deep vein thrombosis.   PATIENT SURVEYS:  LEFS 20 / 80 = 25.0 %  COGNITION: Overall cognitive status: Within functional limits for tasks assessed    SENSATION: WFL  EDEMA:  N/A  POSTURE:  rounded shoulders, forward head, flexed trunk , and genu valgum bilaterally  PALPATION: Increased muscle tension and TTP along R lateral HS  MUSCLE LENGTH: Hamstrings: mild/mod tight R>L ITB: mod tight L>R Piriformis: WFL Hip IR: mod tight R>L Hip flexors: mod tight R>L Quads: mod tight R>L  LOWER EXTREMITY ROM:  Active ROM Right eval Left eval R 10/06/23 L 10/06/23  Knee flexion 104 110 110 110  Knee extension 0 0     Passive ROM Right eval Left eval  Knee flexion 110 110  Knee extension    (Blank rows = not tested)  LOWER EXTREMITY MMT:  MMT Right eval Left eval R 10/06/23 L 10/06/23  Hip flexion 3+ 4 4+ 4+  Hip extension 2+ 3- 3+ 3+  Hip abduction 2+ 3+ 3+ 4-  Hip adduction 3- 4- 3+ 4-  Hip internal rotation 3- 4- 3+ 4-  Hip external rotation 4- 4 4- 4+  Knee flexion 4- 4+ 4 5  Knee extension 4 4+ 4 p! 5  Ankle dorsiflexion 4- 4+ 4 5  Ankle plantarflexion      Ankle inversion      Ankle eversion       (Blank rows = not tested)  FUNCTIONAL TESTS:  5 times sit to stand: 20.35 sec (w/o UE assist), >15 sec indicates recurrent fall risk Timed up and go (TUG): 21.94 sec with SPC, >13.5 sec indicates high risk for falls 10 meter walk test: 35.09 sec with SPC, 0.93 ft/sec with SPC Berg Balance Scale: 37/56, 37-45 significant risk for falls (>80%)  Dynamic Gait Index: 7/24, Scores of 19 or less are predicitve of falls in older community living adults.  10/23/23: 5xSTS: 13.79 sec (w/o UE assist) TUG: 21.03 sec  with SPC, >13.5 sec indicates high risk for falls : 22.81 sec with SPC; 26.03 sec w/o AD Gait speed: 1.44 ft/sec with SPC; 1.26 ft/sec DGI: 10/24, Scores of 19 or less are predicitve of falls in older community living adults.  GAIT: Distance walked:  Clinic distances Assistive device utilized: Single point cane Level of assistance: Modified independence Gait pattern: decreased stride length, knee flexed in stance- Right, knee flexed in stance- Left, shuffling, trunk flexed, and narrow BOS Comments: Stooped posture with genu valgum during gait   TODAY'S TREATMENT:   10/26/23 THERAPEUTIC EXERCISE: To improve strength and endurance.  Demonstration, verbal and tactile cues throughout for technique.  NuStep L6 x 7 min (UE/LE - seat #12)   THERAPEUTIC ACTIVITIES: To improve functional performance.  Demonstration, verbal and tactile cues throughout for technique. Berg = 39/56; 37-45 indicates significant (>80%) fall risk  Berg Balance Test  Sit to Stand Able to stand without using hands and stabilize independently   Standing Unsupported Able to stand safely 2 minutes   Sitting with Back Unsupported but Feet Supported on Floor or Stool Able to sit safely and securely 2 minutes   Stand to Sit Sits safely with minimal use of hands   Transfers Able to transfer safely, minor use of hands   Standing Unsupported with Eyes Closed Able to stand 10 seconds safely   Standing Unsupported with Feet Together Able to place feet together independently and stand for 1 minute with supervision   knee prevent feet from touching  From Standing, Reach Forward with Outstretched Arm Can reach forward >12 cm safely (5")   From Standing Position, Pick up Object from Floor Able to pick up shoe, needs supervision   From Standing Position, Turn to Look Behind Over each Shoulder Needs supervision when turning   Turn 360 Degrees Needs close supervision or verbal cueing   Standing Unsupported, Alternately Place Feet on Step/Stool Able to complete >2 steps/needs minimal assist   Standing Unsupported, One Foot in Front Able to take small step independently and hold 30 seconds   Standing on One Leg Tries to lift leg/unable to hold 3 seconds but remains standing independently    Total Score 39   Berg comment: 37-45 = Significant (>80%) fall risk      NEUROMUSCULAR RE-EDUCATION: To improve balance, coordination, kinesthesia, posture, proprioception, and reduce fall risk.  Counter squat 2 x 10 - cues for posterior weight shift and even weight shift btw LEs    10/23/23 THERAPEUTIC EXERCISE: To improve strength, endurance, ROM, and flexibility.  Demonstration, verbal and tactile cues throughout for technique.  Rec Bike - L1 x 6 min - R foot repeated slipping off pedal  THERAPEUTIC ACTIVITIES: To improve functional performance.  Demonstration, verbal and tactile cues throughout for technique. 5xSTS: 13.79 sec (w/o UE assist) TUG: 21.03 sec with SPC, >13.5 sec indicates high risk for falls : 22.81 sec with SPC; 26.03 sec w/o AD Gait speed: 1.44 ft/sec with SPC; 1.26 ft/sec; gait speed of <1.8 ft/sec indicative of recurrent fall risk DGI: 10/24, Scores of 19 or less are predicitve of falls in older community living adults. Dynamic Gait Index  Level Surface Mild Impairment   Change in Gait Speed Mild Impairment   Gait with Horizontal Head Turns Moderate Impairment   Gait with Vertical Head Turns Moderate Impairment   Gait and Pivot Turn Moderate Impairment   Step Over Obstacle Severe Impairment   Step Around Obstacles Mild Impairment   Steps Moderate Impairment   Total  Score 10      GAIT TRAINING: To normalize gait pattern and improve safety with SPC .  200 ft with SPC - cues provided to try SPC in L hand to offset R LE (patient normally using cane in R hand) -cues necessary for proper sequencing as well as full symmetrical stride length Stairs: Level of Assistance: SBA and CGA Stair Negotiation Technique: Step to Pattern/Alternating Pattern Forwards With use of AD: SPC with Single Rail on Right Number of Stairs: 14  Height of Stairs: 7"  Comments: Cues to ensure full foot placement on step as patient has tendency to have forefoot/half of foot hanging over  edge of step on descent > ascent Near fall due to LOB with SPC upon exiting stairwell, requiring PT assist to correct LOB/prevent fall   10/06/23 THERAPEUTIC EXERCISE: To improve strength, endurance, and ROM.  Demonstration, verbal and tactile cues throughout for technique.  NuStep - L5 x 6 min (B UE/LE) for muscle warm-up to promote tissue perfusion Seated R GTB HS curls 2 x 10 Seated B hip ABD/ER clam with looped GTB at knees 2 x 10  LE MMT  NEUROMUSCULAR RE-EDUCATION: To improve balance, coordination, kinesthesia, posture, and proprioception.  Sit to stand + GTB hip ABD isometric x 10 Sit to stand + GTB hip ABD isometric with L foot on 2" block x 10 to increase R LE workload Sit to stand + GTB hip ABD isometric with L foot on Airex pad x 5 to increase R LE workload B side-stepping with looped GTB at knees 4 x 8 ft in front of mat table - cues to lift leg out to the side rather than slide foot - 1 LOB requiring unplanned return to sitting on mat table   PATIENT EDUCATION:  Education details: progress with PT, ongoing PT POC, and gait safety with SPC   Person educated: Patient Education method: Explanation, Demonstration, and Verbal cues Education comprehension: verbalized understanding, returned demonstration, verbal cues required, and needs further education  HOME EXERCISE PROGRAM: Access Code: [email or web address] URL: [email or web address] Date: 10/26/2023 Prepared by: Glenetta Hew  Exercises - Seated Hamstring Stretch with Strap  - 2 x daily - 7 x weekly - 3 reps - 30 sec hold - Seated Hamstring Set  - 2 x daily - 7 x weekly - 2 sets - 10 reps - 3 sec hold - Seated Hip Adduction Isometrics with Ball  - 1 x daily - 7 x weekly - 2 sets - 10 reps - 3-5 sec hold - Seated Isometric Hip Abduction with Resistance  - 1 x daily - 7 x weekly - 2 sets - 10 reps - 3 sec hold - Seated March with Resistance  - 1 x daily - 7 x weekly - 2 sets - 10 reps - 3 sec hold - Seated Hamstring  Curls with Resistance  - 1 x daily - 7 x weekly - 2 sets - 10 reps - 3 sec hold - Seated Knee Extension with Resistance  - 1 x daily - 7 x weekly - 2 sets - 10 reps - 3 sec hold - Sit to Stand with Resistance Around Legs  - 1 x daily - 3-4 x weekly - 2 sets - 10 reps - Side Stepping with Resistance at Thighs and Counter Support  - 1 x daily - 3-4 x weekly - 2 sets - 10 reps - 3 sec hold - Squat with Chair and Counter Support  - 1 x daily - 3-4 x weekly -  2 sets - 10 reps - 3 sec hold   ASSESSMENT:  CLINICAL IMPRESSION: Completed Berg with some improvement noted from 37/56 to 39/56 but still indicative of significant risk for falls.  Discussed results of balance testing and potential need for more supportive AD (RW vs [id number]) with patient and his sister, with plan to assess safety with RW vs 4WW next visit. Initiated HEP review with plan to progress to more standing exercises but limited by time today.  Holley will benefit from continued skilled PT to address ongoing deficits to improve mobility and activity tolerance with decreased pain interference.   OBJECTIVE IMPAIRMENTS: Abnormal gait, decreased activity tolerance, decreased balance, decreased endurance, decreased knowledge of condition, decreased knowledge of use of DME, decreased mobility, difficulty walking, decreased ROM, decreased strength, decreased safety awareness, hypomobility, increased fascial restrictions, impaired perceived functional ability, increased muscle spasms, impaired flexibility, improper body mechanics, postural dysfunction, and pain.   ACTIVITY LIMITATIONS: carrying, lifting, bending, sitting, standing, squatting, sleeping, stairs, transfers, bed mobility, and locomotion level  PARTICIPATION LIMITATIONS: meal prep, cleaning, laundry, driving, shopping, community activity, and yard work  PERSONAL FACTORS: Age, Fitness, Past/current experiences, Time since onset of injury/illness/exacerbation, and 3+ comorbidities: L TKA  2020, R TKA 2017, OA, HTN, prostate cancer  are also affecting patient's functional outcome.   REHAB POTENTIAL: Good  CLINICAL DECISION MAKING: Evolving/moderate complexity  EVALUATION COMPLEXITY: Moderate   GOALS: Goals reviewed with patient? Yes  SHORT TERM GOALS: Target date: 09/28/2023  Patient will be independent with initial HEP. Baseline:  Goal status: IN PROGRESS - 10/23/23 - patient reports he is uncertain about some of HEP exercises - will plan for review next visit  2.  Patient will report at least 25% improvement in R thigh and knee pain to improve QOL. Baseline: 4/10 constant pain, at worst up to 7/10 Goal status: MET - 09/21/23 - Pt reports 60% improvement in pain since start of PT  3.  Complete standardized balance testing and update POC/goals as indicated. Baseline:  Goal status: MET - 09/07/23  4.  Patient will improve 5x STS time to </= 15 seconds to demonstrate improved functional strength and transfer efficiency  Baseline: 20.35 sec; 09/28/23 - 18 sec Goal status: MET - 10/23/23 - 13.79 sec  LONG TERM GOALS: Target date: 10/26/2023; extended to 12/04/2023  Patient will be independent with advanced/ongoing HEP to improve outcomes and carryover.  Baseline:  Goal status: IN PROGRESS - 10/23/23 - patient reports he is uncertain about some of HEP exercises - will plan for review next visit  2.  Patient will report at least 50-75% improvement in R thigh and knee pain to improve QOL. Baseline: 4/10 constant pain, at worst up to 7/10 Goal status: IN PROGRESS -  09/21/23 - Pt reports 60% improvement in pain since start of PT  3.  Patient will demonstrate improved R knee AROM to >/= 0-110 deg to allow for normal gait and stair mechanics. Baseline: R knee AROM 0-104 Goal status: MET - 10/06/23  4.  Patient will demonstrate improved B LE strength to >/= 4 to 4+/5 for improved stability and ease of mobility. Baseline: Refer to above LE MMT table Goal status: IN PROGRESS -  10/06/23 - improving and partially met for L LE, but R LE weaker than L LE for all muscle groups except hip flexors  5.  Patient will be able to ambulate with LRAD and normal gait pattern at gait speed of >/= 1.8 ft/sec without increased pain for safe limited  community access with decreased risk for recurrent falls.  Baseline: 0.93 ft/sec with SPC Goal status: IN PROGRESS - 10/23/23 - 1.44 ft/sec with SPC; 1.26 ft/sec w/o AD  6. Patient will be able to ascend/descend stairs with 1 HR and reciprocal step pattern safely to access home and community.  Baseline: Step-to/alternating pattern with SPC and 1 HR with CGA of PT  Goal status: IN PROGRESS - 10/23/23 - Step-to/alternating pattern with SPC and 1 HR with CGA of PT  7.  Patient will report >/= 29/80 on LEFS (MCID = 9 pts) to demonstrate improved functional ability. Baseline: 20 / 80 = 25.0 %; 09/25/23 - 39 / 80 = 48.8 % Goal status: MET - 10/26/23 - 42 / 80 = 52.5 %  8.  Patient will demonstrate at least 15/24 on DGI to decrease risk of falls. Baseline: 7/24 Goal status: IN PROGRESS - 10/23/23 - 10/24   PLAN:  PT FREQUENCY: 2x/week  PT DURATION: 8 weeks  PLANNED INTERVENTIONS: 66440- PT Re-evaluation, 97110-Therapeutic exercises, 97530- Therapeutic activity, 97112- Neuromuscular re-education, 97535- Self Care, 34742- Manual therapy, [id number]- Gait training, [id number]- Aquatic Therapy, [id number]- Electrical stimulation (unattended), [phone]- Electrical stimulation (manual), [id number]- Vasopneumatic device, [id number]- Ultrasound, [id number]- Ionotophoresis 4mg /ml Dexamethasone, Patient/Family education, Balance training, Stair training, Taping, Dry Needling, Joint mobilization, Scar mobilization, DME instructions, Cryotherapy, Moist heat, and 97750- Physical Performance Test or Measurement  PLAN FOR NEXT SESSION: trial of RW vs 4WW in place of SPC vs further gait training for proper use of SPC; review HEP; progress proximal LE flexibility (work on hip flexor stretches)  and strengthening with functional emphasis - update HEP accordingly; MT +/- DN to address abnormal muscle tension in R lateral hamstrings and quads; potential trial of kinesiotaping or Iontopatch for R anterior knee pain   Marry Guan, PT 10/26/2023, 11:46 AM     Date of referral: 07/30/2023 Referring provider: Bill Salinas, PA-C Referring diagnosis? M25.561 (ICD-10-CM) - Right knee pain / R LE hamstring strain (biceps femoris) Treatment diagnosis? (if different than referring diagnosis)  Chronic pain of right knee  Pain in right thigh  Muscle weakness (generalized)  Other abnormalities of gait and mobility  Other muscle spasm  What was this (referring dx) caused by? Ongoing Issue and Arthritis  Ashby Dawes of Condition: Chronic (continuous duration > 3 months)   Laterality: Rt  Current Functional Measure Score: LEFS 42 / 80 = 52.5 %    Objective measurements identify impairments when they are compared to normal values, the uninvolved extremity, and prior level of function.  [x]  Yes  []  No  Objective assessment of functional ability: Moderate functional limitations   Briefly describe symptoms: Improvement noted in R thigh pain with PT and overall improvement in knee function/ROM, however ongoing anterior R knee discomfort.  Continued R>L proximal LE weakness.  Reassessment of standardized balance testing revealing gains across all tests with 5xSTS time of 13.79 seconds meeting STG #4, however remaining standardized balance tests indicating continued high risk for falls including TUG time of 21.03 sec with SPC (>13.5 sec indicates high risk for falls), 10MWT/gait speed of 1.44 ft/sec with SPC; 1.26 ft/sec; (gait speed of <1.8 ft/sec indicative of recurrent fall risk), DGI = 10/24 (scores of 19 or less are predicitve of falls in older community living adults), and Berg = 39/56 (37-45 indicates significant (>80%) fall risk).  These scores along with decreased safety noted with use  of SPC indicate potential need for increased assistive device support - discussed findings with patient with  recommendation that walker may be necessary if scores do not continue to improve.  Given above balance impairments and associated fall risk, as well as ongoing weakness identified at last visit, [place] will benefit from recertification for continued skilled PT to address ongoing deficits and impairments to improve mobility and activity tolerance with decreased pain interference and decreased risk for falls, therefore will recommend recert for additional 2x/wk for up to 4-6 weeks.   How did symptoms start: uncertain, possibly muscle cramp  Average pain intensity:  Last 24 hours: 3/10  Past week: 3/10  How often does the pt experience symptoms? Intermittently  How much have the symptoms interfered with usual daily activities? Moderately  How has condition changed since care began at this facility? Much better  In general, how is the patients overall health? Good  Onset date: ~6-8 months   BACK PAIN (STarT Back Screening Tool) - (When applicable): N/A  Has your back pain spread down your leg(s) at sometime in the last 2 weeks? []  Yes   []  No Have you had pain in the shoulder or neck at sometime in the past 2 weeks? []  Yes   []  No Have you only walked short distances because of your back pain? []  Yes   []  No In the past 2 weeks, have you dressed more slowly than usual because of your back pain? []  Yes   []  No Do you think it is not really safe for person with a condition like yours to be physically active? []  Yes   []  No Have worrying thoughts been going through your mind a lot of the time? []  Yes   []  No Do you feel that your back pain is terrible and it is never going to get any better? []  Yes   []  No In general, have you stopped enjoying all the things you usually enjoy? []  Yes   []  No Overall, how bothersome has your back pain been in the last 2 weeks? []  Not at all   []   Slightly     []  Moderate   []  Very much     []  Extremely

## 2023-11-03 ENCOUNTER — Ambulatory Visit

## 2023-11-05 ENCOUNTER — Encounter

## 2023-11-10 ENCOUNTER — Ambulatory Visit: Attending: Medical

## 2023-11-10 DIAGNOSIS — G8929 Other chronic pain: Secondary | ICD-10-CM | POA: Diagnosis present

## 2023-11-10 DIAGNOSIS — M79651 Pain in right thigh: Secondary | ICD-10-CM | POA: Insufficient documentation

## 2023-11-10 DIAGNOSIS — M25561 Pain in right knee: Secondary | ICD-10-CM | POA: Insufficient documentation

## 2023-11-10 DIAGNOSIS — M62838 Other muscle spasm: Secondary | ICD-10-CM | POA: Insufficient documentation

## 2023-11-10 DIAGNOSIS — R2689 Other abnormalities of gait and mobility: Secondary | ICD-10-CM | POA: Insufficient documentation

## 2023-11-10 DIAGNOSIS — M6281 Muscle weakness (generalized): Secondary | ICD-10-CM | POA: Diagnosis present

## 2023-11-10 NOTE — Therapy (Signed)
 OUTPATIENT PHYSICAL THERAPY TREATMENT    Patient Name: Seth [name] MRN: [id number] DOB:10/14/1940, 83 y.o., male Today's Date: 11/10/2023   END OF SESSION:  PT End of Session - 11/10/23 1321     Visit Number 11    Date for PT Re-Evaluation 12/04/23    Authorization Type [employer]    Authorization Time Period 11/03/23- 12/01/23    Authorization - Visit Number 1    Authorization - Number of Visits 4    Progress Note Due on Visit 19   Recert/PN on visit #9 - 10/23/23   PT Start Time 1317    PT Stop Time 1400    PT Time Calculation (min) 43 min    Activity Tolerance Patient tolerated treatment well    Behavior During Therapy [facility] for tasks assessed/performed                 Past Medical History:  Diagnosis Date   Arthritis    Cancer (HCC)    hx of prostate cancer    History of kidney stones    Hypertension    Pneumonia    06/2015    Past Surgical History:  Procedure Laterality Date   PROSTATECTOMY     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   Procedure: RIGHT TOTAL KNEE ARTHROPLASTY;  Surgeon: Ollen Gross, MD;  Location: [facility];  Service: Orthopedics;  Laterality: Right;   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   Procedure: TOTAL KNEE ARTHROPLASTY;  Surgeon: Ollen Gross, MD;  Location: [facility];  Service: Orthopedics;  Laterality: Left;    Patient Active Problem List   Diagnosis Date Noted   Sepsis (HCC) 04/13/2020   Pneumonia due to COVID-19 virus 04/13/2020   Acute hypoxemic respiratory failure (HCC) 04/13/2020   Elevated troponin 04/13/2020   Syncope 08/30/2018   Essential hypertension 08/30/2018   AKI (acute kidney injury) (HCC) 08/30/2018   Hyponatremia 08/30/2018   Volume depletion 08/30/2018   OA (osteoarthritis) of knee 09/10/2015    PCP: Drosinis, Leonia Reader, PA-C   REFERRING PROVIDER: Elizabeth Palau / Ollen Gross, MD  REFERRING DIAG: [phone] (ICD-10-CM) - Right knee pain / R LE hamstring strain (biceps femoris)  THERAPY  DIAG:  Chronic pain of right knee  Pain in right thigh  Muscle weakness (generalized)  Other abnormalities of gait and mobility  Other muscle spasm  RATIONALE FOR EVALUATION AND TREATMENT: Rehabilitation  ONSET DATE: ~6-8 months  NEXT MD VISIT: 11/20/23   SUBJECTIVE:  SUBJECTIVE STATEMENT: Pt reports his R posterior leg still bothering him  EVAL:  Pt reports pain in R posterior lateral thigh for the past 6-8 months w/o known MOI.  He states he feels like at one point in time he had a "charley horse" in his R thigh.  He notes constant pain at ~4/10.  He states his R knee acts up at times since his TKA.  PAIN: Are you having pain? Yes: NPRS scale: 4/10   Pain location: R knee Pain description: tenderness  Aggravating factors: "just there" Relieving factors: nothing  PERTINENT HISTORY:  L TKA 2020, R TKA 2017, OA, HTN, prostate cancer  PRECAUTIONS: Fall  RED FLAGS: None  WEIGHT BEARING RESTRICTIONS: No  FALLS:  Has patient fallen in last 6 months? No, but FOF  LIVING ENVIRONMENT: Lives with: lives with their family Lives in: House/apartment Stairs: Yes: Internal: 14 steps; to basement (no need to access) and External: 2 steps; on right going up, on left going up, and can reach both Has following equipment at home: Single point cane and Grab bars  OCCUPATION: Retired  PLOF: Independent and Leisure: mostly sedentary - look at [employer], work in the yard when the weather is nicer  PATIENT GOALS: "To use my R leg completely."   OBJECTIVE: (objective measures completed at initial evaluation unless otherwise dated)  DIAGNOSTIC FINDINGS:  07/30/23 - Peripheral venous US - R LE: IMPRESSION: No evidence of deep vein thrombosis.   PATIENT SURVEYS:  LEFS 20 / 80 = 25.0  %  COGNITION: Overall cognitive status: Within functional limits for tasks assessed    SENSATION: WFL  EDEMA:  N/A  POSTURE:  rounded shoulders, forward head, flexed trunk , and genu valgum bilaterally  PALPATION: Increased muscle tension and TTP along R lateral HS  MUSCLE LENGTH: Hamstrings: mild/mod tight R>L ITB: mod tight L>R Piriformis: WFL Hip IR: mod tight R>L Hip flexors: mod tight R>L Quads: mod tight R>L  LOWER EXTREMITY ROM:  Active ROM Right eval Left eval R 10/06/23 L 10/06/23  Knee flexion 104 110 110 110  Knee extension 0 0     Passive ROM Right eval Left eval  Knee flexion 110 110  Knee extension    (Blank rows = not tested)  LOWER EXTREMITY MMT:  MMT Right eval Left eval R 10/06/23 L 10/06/23  Hip flexion 3+ 4 4+ 4+  Hip extension 2+ 3- 3+ 3+  Hip abduction 2+ 3+ 3+ 4-  Hip adduction 3- 4- 3+ 4-  Hip internal rotation 3- 4- 3+ 4-  Hip external rotation 4- 4 4- 4+  Knee flexion 4- 4+ 4 5  Knee extension 4 4+ 4 p! 5  Ankle dorsiflexion 4- 4+ 4 5  Ankle plantarflexion      Ankle inversion      Ankle eversion       (Blank rows = not tested)  FUNCTIONAL TESTS:  5 times sit to stand: 20.35 sec (w/o UE assist), >15 sec indicates recurrent fall risk Timed up and go (TUG): 21.94 sec with SPC, >13.5 sec indicates high risk for falls 10 meter walk test: 35.09 sec with SPC, 0.93 ft/sec with SPC Berg Balance Scale: 37/56, 37-45 significant risk for falls (>80%)  Dynamic Gait Index: 7/24, Scores of 19 or less are predicitve of falls in older community living adults.  10/23/23: 5xSTS: 13.79 sec (w/o UE assist) TUG: 21.03 sec with SPC, >13.5 sec indicates high risk for falls : 22.81 sec with SPC; 26.03 sec w/o  AD Gait speed: 1.44 ft/sec with SPC; 1.26 ft/sec DGI: 10/24, Scores of 19 or less are predicitve of falls in older community living adults.  GAIT: Distance walked: Clinic distances Assistive device utilized: Single point cane Level of  assistance: Modified independence Gait pattern: decreased stride length, knee flexed in stance- Right, knee flexed in stance- Left, shuffling, trunk flexed, and narrow BOS Comments: Stooped posture with genu valgum during gait   TODAY'S TREATMENT:  11/10/23 THERAPEUTIC EXERCISE: To improve strength and endurance.  Demonstration, verbal and tactile cues throughout for technique.  NuStep L6 x 7 min (UE/LE - seat #12)  Counter squats 2x10 Staniding hip abduction x 10 BLE Standing marching x 10 BLE Standing hip extension x 10 BLE  GAIT TRAINING: To normalize gait pattern and improve safety. Gait with 4WW and 2 WW- decreased stride length, R foot catching x 2 90 ft with each, cues for posture and to increase step length  10/26/23 THERAPEUTIC EXERCISE: To improve strength and endurance.  Demonstration, verbal and tactile cues throughout for technique.  NuStep L6 x 7 min (UE/LE - seat #12)   THERAPEUTIC ACTIVITIES: To improve functional performance.  Demonstration, verbal and tactile cues throughout for technique. Berg = 39/56; 37-45 indicates significant (>80%) fall risk  Berg Balance Test  Sit to Stand Able to stand without using hands and stabilize independently   Standing Unsupported Able to stand safely 2 minutes   Sitting with Back Unsupported but Feet Supported on Floor or Stool Able to sit safely and securely 2 minutes   Stand to Sit Sits safely with minimal use of hands   Transfers Able to transfer safely, minor use of hands   Standing Unsupported with Eyes Closed Able to stand 10 seconds safely   Standing Unsupported with Feet Together Able to place feet together independently and stand for 1 minute with supervision   knee prevent feet from touching  From Standing, Reach Forward with Outstretched Arm Can reach forward >12 cm safely (5")   From Standing Position, Pick up Object from Floor Able to pick up shoe, needs supervision   From Standing Position, Turn to Look Behind Over each  Shoulder Needs supervision when turning   Turn 360 Degrees Needs close supervision or verbal cueing   Standing Unsupported, Alternately Place Feet on Step/Stool Able to complete >2 steps/needs minimal assist   Standing Unsupported, One Foot in Front Able to take small step independently and hold 30 seconds   Standing on One Leg Tries to lift leg/unable to hold 3 seconds but remains standing independently   Total Score 39   Berg comment: 37-45 = Significant (>80%) fall risk      NEUROMUSCULAR RE-EDUCATION: To improve balance, coordination, kinesthesia, posture, proprioception, and reduce fall risk.  Counter squat 2 x 10 - cues for posterior weight shift and even weight shift btw LEs    10/23/23 THERAPEUTIC EXERCISE: To improve strength, endurance, ROM, and flexibility.  Demonstration, verbal and tactile cues throughout for technique.  Rec Bike - L1 x 6 min - R foot repeated slipping off pedal  THERAPEUTIC ACTIVITIES: To improve functional performance.  Demonstration, verbal and tactile cues throughout for technique. 5xSTS: 13.79 sec (w/o UE assist) TUG: 21.03 sec with SPC, >13.5 sec indicates high risk for falls : 22.81 sec with SPC; 26.03 sec w/o AD Gait speed: 1.44 ft/sec with SPC; 1.26 ft/sec; gait speed of <1.8 ft/sec indicative of recurrent fall risk DGI: 10/24, Scores of 19 or less are predicitve of falls in older  community living adults. Dynamic Gait Index  Level Surface Mild Impairment   Change in Gait Speed Mild Impairment   Gait with Horizontal Head Turns Moderate Impairment   Gait with Vertical Head Turns Moderate Impairment   Gait and Pivot Turn Moderate Impairment   Step Over Obstacle Severe Impairment   Step Around Obstacles Mild Impairment   Steps Moderate Impairment   Total Score 10      GAIT TRAINING: To normalize gait pattern and improve safety with SPC .  200 ft with SPC - cues provided to try SPC in L hand to offset R LE (patient normally using cane in R  hand) -cues necessary for proper sequencing as well as full symmetrical stride length Stairs: Level of Assistance: SBA and CGA Stair Negotiation Technique: Step to Pattern/Alternating Pattern Forwards With use of AD: SPC with Single Rail on Right Number of Stairs: 14  Height of Stairs: 7"  Comments: Cues to ensure full foot placement on step as patient has tendency to have forefoot/half of foot hanging over edge of step on descent > ascent Near fall due to LOB with SPC upon exiting stairwell, requiring PT assist to correct LOB/prevent fall   10/06/23 THERAPEUTIC EXERCISE: To improve strength, endurance, and ROM.  Demonstration, verbal and tactile cues throughout for technique.  NuStep - L5 x 6 min (B UE/LE) for muscle warm-up to promote tissue perfusion Seated R GTB HS curls 2 x 10 Seated B hip ABD/ER clam with looped GTB at knees 2 x 10  LE MMT  NEUROMUSCULAR RE-EDUCATION: To improve balance, coordination, kinesthesia, posture, and proprioception.  Sit to stand + GTB hip ABD isometric x 10 Sit to stand + GTB hip ABD isometric with L foot on 2" block x 10 to increase R LE workload Sit to stand + GTB hip ABD isometric with L foot on Airex pad x 5 to increase R LE workload B side-stepping with looped GTB at knees 4 x 8 ft in front of mat table - cues to lift leg out to the side rather than slide foot - 1 LOB requiring unplanned return to sitting on mat table   PATIENT EDUCATION:  Education details: progress with PT, ongoing PT POC, and gait safety with SPC   Person educated: Patient Education method: Explanation, Demonstration, and Verbal cues Education comprehension: verbalized understanding, returned demonstration, verbal cues required, and needs further education  HOME EXERCISE PROGRAM: Access Code: [email or web address] URL: [email or web address] Date: 10/26/2023 Prepared by: Glenetta Hew  Exercises - Seated Hamstring Stretch with Strap  - 2 x daily - 7 x weekly - 3 reps - 30  sec hold - Seated Hamstring Set  - 2 x daily - 7 x weekly - 2 sets - 10 reps - 3 sec hold - Seated Hip Adduction Isometrics with Ball  - 1 x daily - 7 x weekly - 2 sets - 10 reps - 3-5 sec hold - Seated Isometric Hip Abduction with Resistance  - 1 x daily - 7 x weekly - 2 sets - 10 reps - 3 sec hold - Seated March with Resistance  - 1 x daily - 7 x weekly - 2 sets - 10 reps - 3 sec hold - Seated Hamstring Curls with Resistance  - 1 x daily - 7 x weekly - 2 sets - 10 reps - 3 sec hold - Seated Knee Extension with Resistance  - 1 x daily - 7 x weekly - 2 sets - 10 reps - 3 sec hold -  Sit to Stand with Resistance Around Legs  - 1 x daily - 3-4 x weekly - 2 sets - 10 reps - Side Stepping with Resistance at Thighs and Counter Support  - 1 x daily - 3-4 x weekly - 2 sets - 10 reps - 3 sec hold - Squat with Chair and Counter Support  - 1 x daily - 3-4 x weekly - 2 sets - 10 reps - 3 sec hold   ASSESSMENT:  CLINICAL IMPRESSION: Pt shows better gait pattern and stability with a walker rather than cane. He did tend to have a fwd flexed posture when using the rollator and rolling walker. Worked on standing exercises to improve stability and muscle co-contractions, cues needed to avoid knees over toes with squats and postural cues to avoid fwd leaning with standing hip abduction and extension. He would benefit from more practice using a walker to build his confidence and to improve his gait using this device for safety. Seth [name] will benefit from continued skilled PT to address ongoing deficits to improve mobility and activity tolerance with decreased pain interference.   OBJECTIVE IMPAIRMENTS: Abnormal gait, decreased activity tolerance, decreased balance, decreased endurance, decreased knowledge of condition, decreased knowledge of use of DME, decreased mobility, difficulty walking, decreased ROM, decreased strength, decreased safety awareness, hypomobility, increased fascial restrictions, impaired perceived  functional ability, increased muscle spasms, impaired flexibility, improper body mechanics, postural dysfunction, and pain.   ACTIVITY LIMITATIONS: carrying, lifting, bending, sitting, standing, squatting, sleeping, stairs, transfers, bed mobility, and locomotion level  PARTICIPATION LIMITATIONS: meal prep, cleaning, laundry, driving, shopping, community activity, and yard work  PERSONAL FACTORS: Age, Fitness, Past/current experiences, Time since onset of injury/illness/exacerbation, and 3+ comorbidities: L TKA 2020, R TKA 2017, OA, HTN, prostate cancer  are also affecting patient's functional outcome.   REHAB POTENTIAL: Good  CLINICAL DECISION MAKING: Evolving/moderate complexity  EVALUATION COMPLEXITY: Moderate   GOALS: Goals reviewed with patient? Yes  SHORT TERM GOALS: Target date: 09/28/2023  Patient will be independent with initial HEP. Baseline:  Goal status: IN PROGRESS - 10/23/23 - patient reports he is uncertain about some of HEP exercises - will plan for review next visit  2.  Patient will report at least 25% improvement in R thigh and knee pain to improve QOL. Baseline: 4/10 constant pain, at worst up to 7/10 Goal status: MET - 09/21/23 - Pt reports 60% improvement in pain since start of PT  3.  Complete standardized balance testing and update POC/goals as indicated. Baseline:  Goal status: MET - 09/07/23  4.  Patient will improve 5x STS time to </= 15 seconds to demonstrate improved functional strength and transfer efficiency  Baseline: 20.35 sec; 09/28/23 - 18 sec Goal status: MET - 10/23/23 - 13.79 sec  LONG TERM GOALS: Target date: 10/26/2023; extended to 12/04/2023  Patient will be independent with advanced/ongoing HEP to improve outcomes and carryover.  Baseline:  Goal status: IN PROGRESS - 10/23/23 - patient reports he is uncertain about some of HEP exercises - will plan for review next visit  2.  Patient will report at least 50-75% improvement in R thigh and knee  pain to improve QOL. Baseline: 4/10 constant pain, at worst up to 7/10 Goal status: IN PROGRESS -  09/21/23 - Pt reports 60% improvement in pain since start of PT  3.  Patient will demonstrate improved R knee AROM to >/= 0-110 deg to allow for normal gait and stair mechanics. Baseline: R knee AROM 0-104 Goal status: MET - 10/06/23  4.  Patient will demonstrate improved B LE strength to >/= 4 to 4+/5 for improved stability and ease of mobility. Baseline: Refer to above LE MMT table Goal status: IN PROGRESS - 10/06/23 - improving and partially met for L LE, but R LE weaker than L LE for all muscle groups except hip flexors  5.  Patient will be able to ambulate with LRAD and normal gait pattern at gait speed of >/= 1.8 ft/sec without increased pain for safe limited community access with decreased risk for recurrent falls.  Baseline: 0.93 ft/sec with SPC Goal status: IN PROGRESS - 10/23/23 - 1.44 ft/sec with SPC; 1.26 ft/sec w/o AD; better gait pattern and speed with RW  6. Patient will be able to ascend/descend stairs with 1 HR and reciprocal step pattern safely to access home and community.  Baseline: Step-to/alternating pattern with SPC and 1 HR with CGA of PT  Goal status: IN PROGRESS - 10/23/23 - Step-to/alternating pattern with SPC and 1 HR with CGA of PT  7.  Patient will report >/= 29/80 on LEFS (MCID = 9 pts) to demonstrate improved functional ability. Baseline: 20 / 80 = 25.0 %; 09/25/23 - 39 / 80 = 48.8 % Goal status: MET - 10/26/23 - 42 / 80 = 52.5 %  8.  Patient will demonstrate at least 15/24 on DGI to decrease risk of falls. Baseline: 7/24 Goal status: IN PROGRESS - 10/23/23 - 10/24   PLAN:  PT FREQUENCY: 2x/week  PT DURATION: 8 weeks  PLANNED INTERVENTIONS: 78295- PT Re-evaluation, 97110-Therapeutic exercises, 97530- Therapeutic activity, 97112- Neuromuscular re-education, 97535- Self Care, 62130- Manual therapy, [phone]- Gait training, [phone]- Aquatic Therapy, [phone]- Electrical  stimulation (unattended), [phone]- Electrical stimulation (manual), 97016- Vasopneumatic device, 97035- Ultrasound, 01027- Ionotophoresis 4mg /ml Dexamethasone, Patient/Family education, Balance training, Stair training, Taping, Dry Needling, Joint mobilization, Scar mobilization, DME instructions, Cryotherapy, Moist heat, and 97750- Physical Performance Test or Measurement  PLAN FOR NEXT SESSION: keep practicing gait with RW vs 4WW review HEP; progress proximal LE flexibility (work on hip flexor stretches) and strengthening with functional emphasis - update HEP accordingly; MT +/- DN to address abnormal muscle tension in R lateral hamstrings and quads; potential trial of kinesiotaping or [place] for R anterior knee pain   Darleene Cleaver, PTA 11/10/2023, 2:04 PM     Date of referral: 07/30/2023 Referring provider: Bill Salinas, PA-C Referring diagnosis? M25.561 (ICD-10-CM) - Right knee pain / R LE hamstring strain (biceps femoris) Treatment diagnosis? (if different than referring diagnosis)  Chronic pain of right knee  Pain in right thigh  Muscle weakness (generalized)  Other abnormalities of gait and mobility  Other muscle spasm  What was this (referring dx) caused by? Ongoing Issue and Arthritis  Ashby Dawes of Condition: Chronic (continuous duration > 3 months)   Laterality: Rt  Current Functional Measure Score: LEFS 42 / 80 = 52.5 %    Objective measurements identify impairments when they are compared to normal values, the uninvolved extremity, and prior level of function.  [x]  Yes  []  No  Objective assessment of functional ability: Moderate functional limitations   Briefly describe symptoms: Improvement noted in R thigh pain with PT and overall improvement in knee function/ROM, however ongoing anterior R knee discomfort.  Continued R>L proximal LE weakness.  Reassessment of standardized balance testing revealing gains across all tests with 5xSTS time of 13.79 seconds  meeting STG #4, however remaining standardized balance tests indicating continued high risk for falls including TUG time of 21.03 sec with  SPC (>13.5 sec indicates high risk for falls), 10MWT/gait speed of 1.44 ft/sec with SPC; 1.26 ft/sec; (gait speed of <1.8 ft/sec indicative of recurrent fall risk), DGI = 10/24 (scores of 19 or less are predicitve of falls in older community living adults), and Berg = 39/56 (37-45 indicates significant (>80%) fall risk).  These scores along with decreased safety noted with use of SPC indicate potential need for increased assistive device support - discussed findings with patient with recommendation that walker may be necessary if scores do not continue to improve.  Given above balance impairments and associated fall risk, as well as ongoing weakness identified at last visit, Seth [name] will benefit from recertification for continued skilled PT to address ongoing deficits and impairments to improve mobility and activity tolerance with decreased pain interference and decreased risk for falls, therefore will recommend recert for additional 2x/wk for up to 4-6 weeks.   How did symptoms start: uncertain, possibly muscle cramp  Average pain intensity:  Last 24 hours: 3/10  Past week: 3/10  How often does the pt experience symptoms? Intermittently  How much have the symptoms interfered with usual daily activities? Moderately  How has condition changed since care began at this facility? Much better  In general, how is the patients overall health? Good  Onset date: ~6-8 months   BACK PAIN (STarT Back Screening Tool) - (When applicable): N/A  Has your back pain spread down your leg(s) at sometime in the last 2 weeks? []  Yes   []  No Have you had pain in the shoulder or neck at sometime in the past 2 weeks? []  Yes   []  No Have you only walked short distances because of your back pain? []  Yes   []  No In the past 2 weeks, have you dressed more slowly than usual because of  your back pain? []  Yes   []  No Do you think it is not really safe for person with a condition like yours to be physically active? []  Yes   []  No Have worrying thoughts been going through your mind a lot of the time? []  Yes   []  No Do you feel that your back pain is terrible and it is never going to get any better? []  Yes   []  No In general, have you stopped enjoying all the things you usually enjoy? []  Yes   []  No Overall, how bothersome has your back pain been in the last 2 weeks? []  Not at all   []  Slightly     []  Moderate   []  Very much     []  Extremely

## 2023-11-12 ENCOUNTER — Other Ambulatory Visit: Payer: Self-pay

## 2023-11-12 ENCOUNTER — Ambulatory Visit

## 2023-11-12 DIAGNOSIS — M79651 Pain in right thigh: Secondary | ICD-10-CM

## 2023-11-12 DIAGNOSIS — M25561 Pain in right knee: Secondary | ICD-10-CM | POA: Diagnosis not present

## 2023-11-12 DIAGNOSIS — R2689 Other abnormalities of gait and mobility: Secondary | ICD-10-CM

## 2023-11-12 DIAGNOSIS — G8929 Other chronic pain: Secondary | ICD-10-CM

## 2023-11-12 DIAGNOSIS — M6281 Muscle weakness (generalized): Secondary | ICD-10-CM

## 2023-11-12 NOTE — Therapy (Addendum)
 OUTPATIENT PHYSICAL THERAPY TREATMENT    Patient Name: Seth [name] MRN: [id number] DOB:June 01, 1941, 83 y.o., male Today's Date: 11/12/2023   END OF SESSION:  PT End of Session - 11/12/23 1317     Visit Number 12    Date for PT Re-Evaluation 12/04/23    Authorization Type [employer]    Authorization Time Period 11/03/23- 12/01/23    Progress Note Due on Visit 19    PT Start Time 1317    PT Stop Time 1400    PT Time Calculation (min) 43 min    Activity Tolerance Patient tolerated treatment well    Behavior During Therapy [facility] for tasks assessed/performed                  Past Medical History:  Diagnosis Date   Arthritis    Cancer (HCC)    hx of prostate cancer    History of kidney stones    Hypertension    Pneumonia    06/2015    Past Surgical History:  Procedure Laterality Date   PROSTATECTOMY     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   Procedure: RIGHT TOTAL KNEE ARTHROPLASTY;  Surgeon: Ollen Gross, MD;  Location: [facility];  Service: Orthopedics;  Laterality: Right;   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   Procedure: TOTAL KNEE ARTHROPLASTY;  Surgeon: Ollen Gross, MD;  Location: [facility];  Service: Orthopedics;  Laterality: Left;    Patient Active Problem List   Diagnosis Date Noted   Sepsis (HCC) 04/13/2020   Pneumonia due to COVID-19 virus 04/13/2020   Acute hypoxemic respiratory failure (HCC) 04/13/2020   Elevated troponin 04/13/2020   Syncope 08/30/2018   Essential hypertension 08/30/2018   AKI (acute kidney injury) (HCC) 08/30/2018   Hyponatremia 08/30/2018   Volume depletion 08/30/2018   OA (osteoarthritis) of knee 09/10/2015    PCP: Drosinis, Leonia Reader, PA-C   REFERRING PROVIDER: Elizabeth Palau / Ollen Gross, MD  REFERRING DIAG: [phone] (ICD-10-CM) - Right knee pain / R LE hamstring strain (biceps femoris)  THERAPY DIAG:  No diagnosis found.  RATIONALE FOR EVALUATION AND TREATMENT: Rehabilitation  ONSET DATE:  ~6-8 months  NEXT MD VISIT: 11/20/23   SUBJECTIVE:                                                                                                                                                                                                         SUBJECTIVE STATEMENT: Pt reports his R posterior leg still bothering him  EVAL:  Pt reports pain in R posterior  lateral thigh for the past 6-8 months w/o known MOI.  He states he feels like at one point in time he had a "charley horse" in his R thigh.  He notes constant pain at ~4/10.  He states his R knee acts up at times since his TKA.  PAIN: Are you having pain? Yes: NPRS scale: 4/10   Pain location: R knee Pain description: tenderness  Aggravating factors: "just there" Relieving factors: nothing  PERTINENT HISTORY:  L TKA 2020, R TKA 2017, OA, HTN, prostate cancer  PRECAUTIONS: Fall  RED FLAGS: None  WEIGHT BEARING RESTRICTIONS: No  FALLS:  Has patient fallen in last 6 months? No, but FOF  LIVING ENVIRONMENT: Lives with: lives with their family Lives in: House/apartment Stairs: Yes: Internal: 14 steps; to basement (no need to access) and External: 2 steps; on right going up, on left going up, and can reach both Has following equipment at home: Single point cane and Grab bars  OCCUPATION: Retired  PLOF: Independent and Leisure: mostly sedentary - look at [employer], work in the yard when the weather is nicer  PATIENT GOALS: "To use my R leg completely."   OBJECTIVE: (objective measures completed at initial evaluation unless otherwise dated)  DIAGNOSTIC FINDINGS:  07/30/23 - Peripheral venous US - R LE: IMPRESSION: No evidence of deep vein thrombosis.   PATIENT SURVEYS:  LEFS 20 / 80 = 25.0 %  COGNITION: Overall cognitive status: Within functional limits for tasks assessed    SENSATION: WFL  EDEMA:  N/A  POSTURE:  rounded shoulders, forward head, flexed trunk , and genu valgum bilaterally  PALPATION: Increased  muscle tension and TTP along R lateral HS  MUSCLE LENGTH: Hamstrings: mild/mod tight R>L ITB: mod tight L>R Piriformis: WFL Hip IR: mod tight R>L Hip flexors: mod tight R>L Quads: mod tight R>L  LOWER EXTREMITY ROM:  Active ROM Right eval Left eval R 10/06/23 L 10/06/23  Knee flexion 104 110 110 110  Knee extension 0 0     Passive ROM Right eval Left eval  Knee flexion 110 110  Knee extension    (Blank rows = not tested)  LOWER EXTREMITY MMT:  MMT Right eval Left eval R 10/06/23 L 10/06/23  Hip flexion 3+ 4 4+ 4+  Hip extension 2+ 3- 3+ 3+  Hip abduction 2+ 3+ 3+ 4-  Hip adduction 3- 4- 3+ 4-  Hip internal rotation 3- 4- 3+ 4-  Hip external rotation 4- 4 4- 4+  Knee flexion 4- 4+ 4 5  Knee extension 4 4+ 4 p! 5  Ankle dorsiflexion 4- 4+ 4 5  Ankle plantarflexion      Ankle inversion      Ankle eversion       (Blank rows = not tested)  FUNCTIONAL TESTS:  5 times sit to stand: 20.35 sec (w/o UE assist), >15 sec indicates recurrent fall risk Timed up and go (TUG): 21.94 sec with SPC, >13.5 sec indicates high risk for falls 10 meter walk test: 35.09 sec with SPC, 0.93 ft/sec with SPC Berg Balance Scale: 37/56, 37-45 significant risk for falls (>80%)  Dynamic Gait Index: 7/24, Scores of 19 or less are predicitve of falls in older community living adults.  10/23/23: 5xSTS: 13.79 sec (w/o UE assist) TUG: 21.03 sec with SPC, >13.5 sec indicates high risk for falls : 22.81 sec with SPC; 26.03 sec w/o AD Gait speed: 1.44 ft/sec with SPC; 1.26 ft/sec DGI: 10/24, Scores of 19 or less are predicitve of falls  in older community living adults.  GAIT: Distance walked: Clinic distances Assistive device utilized: Single point cane Level of assistance: Modified independence Gait pattern: decreased stride length, knee flexed in stance- Right, knee flexed in stance- Left, shuffling, trunk flexed, and narrow BOS Comments: Stooped posture with genu valgum during gait   TODAY'S  TREATMENT:  11/12/23: Nustep: L5 6 min Side stepping along counter, 20' 4 x  Supine for manual stretch R plantarflexors and R hamstrings Supine bridging with black theraband around thighs for proximal input 15x Standing for toe/ heel rocks at counter Standing alt hip ext Knee ext 10# 2 x15 on BATCA Standing on airex, attempted static standing without hands, unable Lunges each LE on airex, 10 reps, tended to buckle  Adjusted r walker up for more appropriate height   11/10/23 THERAPEUTIC EXERCISE: To improve strength and endurance.  Demonstration, verbal and tactile cues throughout for technique.  NuStep L6 x 7 min (UE/LE - seat #12)  Counter squats 2x10 Staniding hip abduction x 10 BLE Standing marching x 10 BLE Standing hip extension x 10 BLE  GAIT TRAINING: To normalize gait pattern and improve safety. Gait with 4WW and 2 WW- decreased stride length, R foot catching x 2 90 ft with each, cues for posture and to increase step length  10/26/23 THERAPEUTIC EXERCISE: To improve strength and endurance.  Demonstration, verbal and tactile cues throughout for technique.  NuStep L6 x 7 min (UE/LE - seat #12)   THERAPEUTIC ACTIVITIES: To improve functional performance.  Demonstration, verbal and tactile cues throughout for technique. Berg = 39/56; 37-45 indicates significant (>80%) fall risk  Berg Balance Test  Sit to Stand Able to stand without using hands and stabilize independently   Standing Unsupported Able to stand safely 2 minutes   Sitting with Back Unsupported but Feet Supported on Floor or Stool Able to sit safely and securely 2 minutes   Stand to Sit Sits safely with minimal use of hands   Transfers Able to transfer safely, minor use of hands   Standing Unsupported with Eyes Closed Able to stand 10 seconds safely   Standing Unsupported with Feet Together Able to place feet together independently and stand for 1 minute with supervision   knee prevent feet from touching  From  Standing, Reach Forward with Outstretched Arm Can reach forward >12 cm safely (5")   From Standing Position, Pick up Object from Floor Able to pick up shoe, needs supervision   From Standing Position, Turn to Look Behind Over each Shoulder Needs supervision when turning   Turn 360 Degrees Needs close supervision or verbal cueing   Standing Unsupported, Alternately Place Feet on Step/Stool Able to complete >2 steps/needs minimal assist   Standing Unsupported, One Foot in Front Able to take small step independently and hold 30 seconds   Standing on One Leg Tries to lift leg/unable to hold 3 seconds but remains standing independently   Total Score 39   Berg comment: 37-45 = Significant (>80%) fall risk      NEUROMUSCULAR RE-EDUCATION: To improve balance, coordination, kinesthesia, posture, proprioception, and reduce fall risk.  Counter squat 2 x 10 - cues for posterior weight shift and even weight shift btw LEs    10/23/23 THERAPEUTIC EXERCISE: To improve strength, endurance, ROM, and flexibility.  Demonstration, verbal and tactile cues throughout for technique.  Rec Bike - L1 x 6 min - R foot repeated slipping off pedal  THERAPEUTIC ACTIVITIES: To improve functional performance.  Demonstration, verbal and tactile cues  throughout for technique. 5xSTS: 13.79 sec (w/o UE assist) TUG: 21.03 sec with SPC, >13.5 sec indicates high risk for falls : 22.81 sec with SPC; 26.03 sec w/o AD Gait speed: 1.44 ft/sec with SPC; 1.26 ft/sec; gait speed of <1.8 ft/sec indicative of recurrent fall risk DGI: 10/24, Scores of 19 or less are predicitve of falls in older community living adults. Dynamic Gait Index  Level Surface Mild Impairment   Change in Gait Speed Mild Impairment   Gait with Horizontal Head Turns Moderate Impairment   Gait with Vertical Head Turns Moderate Impairment   Gait and Pivot Turn Moderate Impairment   Step Over Obstacle Severe Impairment   Step Around Obstacles Mild Impairment    Steps Moderate Impairment   Total Score 10      GAIT TRAINING: To normalize gait pattern and improve safety with SPC .  200 ft with SPC - cues provided to try SPC in L hand to offset R LE (patient normally using cane in R hand) -cues necessary for proper sequencing as well as full symmetrical stride length Stairs: Level of Assistance: SBA and CGA Stair Negotiation Technique: Step to Pattern/Alternating Pattern Forwards With use of AD: SPC with Single Rail on Right Number of Stairs: 14  Height of Stairs: 7"  Comments: Cues to ensure full foot placement on step as patient has tendency to have forefoot/half of foot hanging over edge of step on descent > ascent Near fall due to LOB with SPC upon exiting stairwell, requiring PT assist to correct LOB/prevent fall   10/06/23 THERAPEUTIC EXERCISE: To improve strength, endurance, and ROM.  Demonstration, verbal and tactile cues throughout for technique.  NuStep - L5 x 6 min (B UE/LE) for muscle warm-up to promote tissue perfusion Seated R GTB HS curls 2 x 10 Seated B hip ABD/ER clam with looped GTB at knees 2 x 10  LE MMT  NEUROMUSCULAR RE-EDUCATION: To improve balance, coordination, kinesthesia, posture, and proprioception.  Sit to stand + GTB hip ABD isometric x 10 Sit to stand + GTB hip ABD isometric with L foot on 2" block x 10 to increase R LE workload Sit to stand + GTB hip ABD isometric with L foot on Airex pad x 5 to increase R LE workload B side-stepping with looped GTB at knees 4 x 8 ft in front of mat table - cues to lift leg out to the side rather than slide foot - 1 LOB requiring unplanned return to sitting on mat table   PATIENT EDUCATION:  Education details: progress with PT, ongoing PT POC, and gait safety with SPC   Person educated: Patient Education method: Explanation, Demonstration, and Verbal cues Education comprehension: verbalized understanding, returned demonstration, verbal cues required, and needs further  education  HOME EXERCISE PROGRAM: Access Code: [email or web address] URL: [email or web address] Date: 10/26/2023 Prepared by: Glenetta Hew  Exercises - Seated Hamstring Stretch with Strap  - 2 x daily - 7 x weekly - 3 reps - 30 sec hold - Seated Hamstring Set  - 2 x daily - 7 x weekly - 2 sets - 10 reps - 3 sec hold - Seated Hip Adduction Isometrics with Ball  - 1 x daily - 7 x weekly - 2 sets - 10 reps - 3-5 sec hold - Seated Isometric Hip Abduction with Resistance  - 1 x daily - 7 x weekly - 2 sets - 10 reps - 3 sec hold - Seated March with Resistance  - 1 x daily - 7 x weekly -  2 sets - 10 reps - 3 sec hold - Seated Hamstring Curls with Resistance  - 1 x daily - 7 x weekly - 2 sets - 10 reps - 3 sec hold - Seated Knee Extension with Resistance  - 1 x daily - 7 x weekly - 2 sets - 10 reps - 3 sec hold - Sit to Stand with Resistance Around Legs  - 1 x daily - 3-4 x weekly - 2 sets - 10 reps - Side Stepping with Resistance at Thighs and Counter Support  - 1 x daily - 3-4 x weekly - 2 sets - 10 reps - 3 sec hold - Squat with Chair and Counter Support  - 1 x daily - 3-4 x weekly - 2 sets - 10 reps - 3 sec hold   ASSESSMENT:  CLINICAL IMPRESSION: Pt shows better gait pattern and stability with a walker rather than cane. Very week throughout LE's, noted some buckling  at times, particularly when fatigued. Worked on standing exercises to improve stability and muscle co-contractions,  also hip strengthening in supine and side lying.  Needs encouragement to use walker. Sister brought him and asked PT to discuss /advise pt to avoid lawnmower and avoid walking down steps on deck at his house. Markees will benefit from continued skilled PT to address ongoing deficits to improve mobility and activity tolerance with decreased pain interference.   OBJECTIVE IMPAIRMENTS: Abnormal gait, decreased activity tolerance, decreased balance, decreased endurance, decreased knowledge of condition, decreased  knowledge of use of DME, decreased mobility, difficulty walking, decreased ROM, decreased strength, decreased safety awareness, hypomobility, increased fascial restrictions, impaired perceived functional ability, increased muscle spasms, impaired flexibility, improper body mechanics, postural dysfunction, and pain.   ACTIVITY LIMITATIONS: carrying, lifting, bending, sitting, standing, squatting, sleeping, stairs, transfers, bed mobility, and locomotion level  PARTICIPATION LIMITATIONS: meal prep, cleaning, laundry, driving, shopping, community activity, and yard work  PERSONAL FACTORS: Age, Fitness, Past/current experiences, Time since onset of injury/illness/exacerbation, and 3+ comorbidities: L TKA 2020, R TKA 2017, OA, HTN, prostate cancer  are also affecting patient's functional outcome.   REHAB POTENTIAL: Good  CLINICAL DECISION MAKING: Evolving/moderate complexity  EVALUATION COMPLEXITY: Moderate   GOALS: Goals reviewed with patient? Yes  SHORT TERM GOALS: Target date: 09/28/2023  Patient will be independent with initial HEP. Baseline:  Goal status: IN PROGRESS - 10/23/23 - patient reports he is uncertain about some of HEP exercises - will plan for review next visit  2.  Patient will report at least 25% improvement in R thigh and knee pain to improve QOL. Baseline: 4/10 constant pain, at worst up to 7/10 Goal status: MET - 09/21/23 - Pt reports 60% improvement in pain since start of PT  3.  Complete standardized balance testing and update POC/goals as indicated. Baseline:  Goal status: MET - 09/07/23  4.  Patient will improve 5x STS time to </= 15 seconds to demonstrate improved functional strength and transfer efficiency  Baseline: 20.35 sec; 09/28/23 - 18 sec Goal status: MET - 10/23/23 - 13.79 sec  LONG TERM GOALS: Target date: 10/26/2023; extended to 12/04/2023  Patient will be independent with advanced/ongoing HEP to improve outcomes and carryover.  Baseline:  Goal status:  IN PROGRESS - 10/23/23 - patient reports he is uncertain about some of HEP exercises - will plan for review next visit  2.  Patient will report at least 50-75% improvement in R thigh and knee pain to improve QOL. Baseline: 4/10 constant pain, at worst up to 7/10  Goal status: IN PROGRESS -  09/21/23 - Pt reports 60% improvement in pain since start of PT  3.  Patient will demonstrate improved R knee AROM to >/= 0-110 deg to allow for normal gait and stair mechanics. Baseline: R knee AROM 0-104 Goal status: MET - 10/06/23  4.  Patient will demonstrate improved B LE strength to >/= 4 to 4+/5 for improved stability and ease of mobility. Baseline: Refer to above LE MMT table Goal status: IN PROGRESS - 10/06/23 - improving and partially met for L LE, but R LE weaker than L LE for all muscle groups except hip flexors  5.  Patient will be able to ambulate with LRAD and normal gait pattern at gait speed of >/= 1.8 ft/sec without increased pain for safe limited community access with decreased risk for recurrent falls.  Baseline: 0.93 ft/sec with SPC Goal status: IN PROGRESS - 10/23/23 - 1.44 ft/sec with SPC; 1.26 ft/sec w/o AD; better gait pattern and speed with RW  6. Patient will be able to ascend/descend stairs with 1 HR and reciprocal step pattern safely to access home and community.  Baseline: Step-to/alternating pattern with SPC and 1 HR with CGA of PT  Goal status: IN PROGRESS - 10/23/23 - Step-to/alternating pattern with SPC and 1 HR with CGA of PT  7.  Patient will report >/= 29/80 on LEFS (MCID = 9 pts) to demonstrate improved functional ability. Baseline: 20 / 80 = 25.0 %; 09/25/23 - 39 / 80 = 48.8 % Goal status: MET - 10/26/23 - 42 / 80 = 52.5 %  8.  Patient will demonstrate at least 15/24 on DGI to decrease risk of falls. Baseline: 7/24 Goal status: IN PROGRESS - 10/23/23 - 10/24   PLAN:  PT FREQUENCY: 2x/week  PT DURATION: 8 weeks  PLANNED INTERVENTIONS: 43329- PT Re-evaluation,  97110-Therapeutic exercises, 97530- Therapeutic activity, 97112- Neuromuscular re-education, 97535- Self Care, 51884- Manual therapy, [phone]- Gait training, [phone]- Aquatic Therapy, [phone]- Electrical stimulation (unattended), [phone]- Electrical stimulation (manual), 97016- Vasopneumatic device, [id number]- Ultrasound, [id number]- Ionotophoresis 4mg /ml Dexamethasone, Patient/Family education, Balance training, Stair training, Taping, Dry Needling, Joint mobilization, Scar mobilization, DME instructions, Cryotherapy, Moist heat, and 97750- Physical Performance Test or Measurement  PLAN FOR NEXT SESSION: keep practicing gait with RW vs 4WW review HEP; progress proximal LE flexibility (work on hip flexor stretches) and strengthening with functional emphasis - update HEP accordingly; MT +/- DN to address abnormal muscle tension in R lateral hamstrings and quads; potential trial of kinesiotaping or [place]op[place] for R anterior knee pain   Hera Celaya L Jonasia Coiner, PT 11/12/2023, 1:28 PM     Date of referral: 07/30/2023 Referring provider: Bill Salinas, PA-C Referring diagnosis? M25.561 (ICD-10-CM) - Right knee pain / R LE hamstring strain (biceps femoris) Treatment diagnosis? (if different than referring diagnosis)  No diagnosis found.  What was this (referring dx) caused by? Ongoing Issue and Arthritis  Ashby Dawes of Condition: Chronic (continuous duration > 3 months)   Laterality: Rt  Current Functional Measure Score: LEFS 42 / 80 = 52.5 %    Objective measurements identify impairments when they are compared to normal values, the uninvolved extremity, and prior level of function.  [x]  Yes  []  No  Objective assessment of functional ability: Moderate functional limitations   Briefly describe symptoms: Improvement noted in R thigh pain with PT and overall improvement in knee function/ROM, however ongoing anterior R knee discomfort.  Continued R>L proximal LE weakness.  Reassessment of standardized balance testing  revealing gains across  all tests with 5xSTS time of 13.79 seconds meeting STG #4, however remaining standardized balance tests indicating continued high risk for falls including TUG time of 21.03 sec with SPC (>13.5 sec indicates high risk for falls), 10MWT/gait speed of 1.44 ft/sec with SPC; 1.26 ft/sec; (gait speed of <1.8 ft/sec indicative of recurrent fall risk), DGI = 10/24 (scores of 19 or less are predicitve of falls in older community living adults), and Berg = 39/56 (37-45 indicates significant (>80%) fall risk).  These scores along with decreased safety noted with use of SPC indicate potential need for increased assistive device support - discussed findings with patient with recommendation that walker may be necessary if scores do not continue to improve.  Given above balance impairments and associated fall risk, as well as ongoing weakness identified at last visit, Curly will benefit from recertification for continued skilled PT to address ongoing deficits and impairments to improve mobility and activity tolerance with decreased pain interference and decreased risk for falls, therefore will recommend recert for additional 2x/wk for up to 4-6 weeks.   How did symptoms start: uncertain, possibly muscle cramp  Average pain intensity:  Last 24 hours: 3/10  Past week: 3/10  How often does the pt experience symptoms? Intermittently  How much have the symptoms interfered with usual daily activities? Moderately  How has condition changed since care began at this facility? Much better  In general, how is the patients overall health? Good  Onset date: ~6-8 months   BACK PAIN (STarT Back Screening Tool) - (When applicable): N/A  Has your back pain spread down your leg(s) at sometime in the last 2 weeks? []  Yes   []  No Have you had pain in the shoulder or neck at sometime in the past 2 weeks? []  Yes   []  No Have you only walked short distances because of your back pain? []  Yes   []  No In  the past 2 weeks, have you dressed more slowly than usual because of your back pain? []  Yes   []  No Do you think it is not really safe for person with a condition like yours to be physically active? []  Yes   []  No Have worrying thoughts been going through your mind a lot of the time? []  Yes   []  No Do you feel that your back pain is terrible and it is never going to get any better? []  Yes   []  No In general, have you stopped enjoying all the things you usually enjoy? []  Yes   []  No Overall, how bothersome has your back pain been in the last 2 weeks? []  Not at all   []  Slightly     []  Moderate   []  Very much     []  Extremely

## 2023-11-17 ENCOUNTER — Ambulatory Visit: Admitting: Physical Therapy

## 2023-11-17 ENCOUNTER — Encounter: Payer: Self-pay | Admitting: Physical Therapy

## 2023-11-17 DIAGNOSIS — R2689 Other abnormalities of gait and mobility: Secondary | ICD-10-CM

## 2023-11-17 DIAGNOSIS — G8929 Other chronic pain: Secondary | ICD-10-CM

## 2023-11-17 DIAGNOSIS — M62838 Other muscle spasm: Secondary | ICD-10-CM

## 2023-11-17 DIAGNOSIS — M79651 Pain in right thigh: Secondary | ICD-10-CM

## 2023-11-17 DIAGNOSIS — M6281 Muscle weakness (generalized): Secondary | ICD-10-CM

## 2023-11-17 DIAGNOSIS — M25561 Pain in right knee: Secondary | ICD-10-CM | POA: Diagnosis not present

## 2023-11-17 NOTE — Therapy (Signed)
 OUTPATIENT PHYSICAL THERAPY TREATMENT  Progress Note  Reporting Period 10/23/2023 to 11/17/2023   See note below for Objective Data and Assessment of Progress/Goals.      Patient Name: Seth [name] MRN: [id number] DOB:[birth year], 83 y.o., male Today's Date: 11/17/2023   END OF SESSION:  PT End of Session - 11/17/23 1102     Visit Number 13    Date for PT Re-Evaluation 12/04/23    Authorization Type [employer] Medicare    Authorization Time Period 11/03/23- 12/01/23    Authorization - Visit Number 3    Authorization - Number of Visits 4    Progress Note Due on Visit 19    PT Start Time 1102    PT Stop Time 1148    PT Time Calculation (min) 46 min    Activity Tolerance Patient tolerated treatment well    Behavior During Therapy [facility] for tasks assessed/performed                   Past Medical History:  Diagnosis Date   Arthritis    Cancer (HCC)    hx of prostate cancer    History of kidney stones    Hypertension    Pneumonia    06/2015    Past Surgical History:  Procedure Laterality Date   PROSTATECTOMY     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   Procedure: RIGHT TOTAL KNEE ARTHROPLASTY;  Surgeon: Ollen Gross, MD;  Location: [facility];  Service: Orthopedics;  Laterality: Right;   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   Procedure: TOTAL KNEE ARTHROPLASTY;  Surgeon: Ollen Gross, MD;  Location: [facility];  Service: Orthopedics;  Laterality: Left;    Patient Active Problem List   Diagnosis Date Noted   Sepsis (HCC) 04/13/2020   Pneumonia due to COVID-19 virus 04/13/2020   Acute hypoxemic respiratory failure (HCC) 04/13/2020   Elevated troponin 04/13/2020   Syncope 08/30/2018   Essential hypertension 08/30/2018   AKI (acute kidney injury) (HCC) 08/30/2018   Hyponatremia 08/30/2018   Volume depletion 08/30/2018   OA (osteoarthritis) of knee 09/10/2015    PCP: Drosinis, Leonia Reader, PA-C   REFERRING PROVIDER: Elizabeth Palau / Ollen Gross,  MD  REFERRING DIAG: [id number] (ICD-10-CM) - Right knee pain / R LE hamstring strain (biceps femoris)  THERAPY DIAG:  Chronic pain of right knee  Pain in right thigh  Muscle weakness (generalized)  Other abnormalities of gait and mobility  Other muscle spasm  RATIONALE FOR EVALUATION AND TREATMENT: Rehabilitation  ONSET DATE: ~6-8 months  NEXT MD VISIT: 11/20/23   SUBJECTIVE:  SUBJECTIVE STATEMENT: Pt reports he still has constant pain in the R knee, sometimes not as bad as others.  No longer having the thigh pain.  He denies any issues with daily activities.   EVAL:  Pt reports pain in R posterior lateral thigh for the past 6-8 months w/o known MOI.  He states he feels like at one point in time he had a "charley horse" in his R thigh.  He notes constant pain at ~4/10.  He states his R knee acts up at times since his TKA.  PAIN: Are you having pain? Yes: NPRS scale: 4/10   Pain location: R knee Pain description: tenderness  Aggravating factors: "just there" Relieving factors: nothing  PERTINENT HISTORY:  L TKA 2020, R TKA 2017, OA, HTN, prostate cancer  PRECAUTIONS: Fall  RED FLAGS: None  WEIGHT BEARING RESTRICTIONS: No  FALLS:  Has patient fallen in last 6 months? No, but FOF  LIVING ENVIRONMENT: Lives with: lives with their family Lives in: House/apartment Stairs: Yes: Internal: 14 steps; to basement (no need to access) and External: 2 steps; on right going up, on left going up, and can reach both Has following equipment at home: Single point cane and Grab bars  OCCUPATION: Retired  PLOF: Independent and Leisure: mostly sedentary - look at [employer], work in the yard when the weather is nicer  PATIENT GOALS: "To use my R leg completely."   OBJECTIVE: (objective  measures completed at initial evaluation unless otherwise dated)  DIAGNOSTIC FINDINGS:  07/30/23 - Peripheral venous US - R LE: IMPRESSION: No evidence of deep vein thrombosis.   PATIENT SURVEYS:  LEFS 20 / 80 = 25.0 %  COGNITION: Overall cognitive status: Within functional limits for tasks assessed    SENSATION: WFL  EDEMA:  N/A  POSTURE:  rounded shoulders, forward head, flexed trunk , and genu valgum bilaterally  PALPATION: Increased muscle tension and TTP along R lateral HS  MUSCLE LENGTH: Hamstrings: mild/mod tight R>L ITB: mod tight L>R Piriformis: WFL Hip IR: mod tight R>L Hip flexors: mod tight R>L Quads: mod tight R>L  LOWER EXTREMITY ROM:  Active ROM Right eval Left eval R 10/06/23 L 10/06/23  Knee flexion 104 110 110 110  Knee extension 0 0     Passive ROM Right eval Left eval  Knee flexion 110 110  Knee extension    (Blank rows = not tested)  LOWER EXTREMITY MMT:  MMT Right eval Left eval R 10/06/23 L 10/06/23 R 11/17/23 L 11/17/23  Hip flexion 3+ 4 4+ 4+ 4+ 5  Hip extension 2+ 3- 3+ 3+ 3+ 4-  Hip abduction 2+ 3+ 3+ 4- 4- 4  Hip adduction 3- 4- 3+ 4- 4- 4  Hip internal rotation 3- 4- 3+ 4- 3+ 4-  Hip external rotation 4- 4 4- 4+ 4 4+  Knee flexion 4- 4+ 4 5 4 5   Knee extension 4 4+ 4 p! 5 5 5   Ankle dorsiflexion 4- 4+ 4 5 4 5   Ankle plantarflexion        Ankle inversion        Ankle eversion         (Blank rows = not tested)  FUNCTIONAL TESTS:  5 times sit to stand: 20.35 sec (w/o UE assist), >15 sec indicates recurrent fall risk Timed up and go (TUG): 21.94 sec with SPC, >13.5 sec indicates high risk for falls 10 meter walk test: 35.09 sec with SPC, 0.93 ft/sec with SPC Berg Balance Scale: 37/56, 37-45  significant risk for falls (>80%)  Dynamic Gait Index: 7/24, Scores of 19 or less are predicitve of falls in older community living adults.  10/23/23: 5xSTS: 13.79 sec (w/o UE assist) TUG: 21.03 sec with SPC, >13.5 sec indicates high risk for  falls : 22.81 sec with SPC; 26.03 sec w/o AD Gait speed: 1.44 ft/sec with SPC; 1.26 ft/sec DGI: 10/24, Scores of 19 or less are predicitve of falls in older community living adults.  3/17/25Sharlene Motts = 39/56; 37-45 indicates significant (>80%) fall risk  11/17/23: = 16.22 with RW Gait speed = 2.02 ft/sec DGI = 14/24, Scores of 19 or less are predicitve of falls in older community living adults.  GAIT: Distance walked: Clinic distances Assistive device utilized: Single point cane Level of assistance: Modified independence Gait pattern: decreased stride length, knee flexed in stance- Right, knee flexed in stance- Left, shuffling, trunk flexed, and narrow BOS Comments: Stooped posture with genu valgum during gait   TODAY'S TREATMENT:   11/17/23 PHYSICAL PERFORMANCE TEST or MEASUREMENT: = 16.22 with RW Gait speed = 2.02 ft/sec with RW, 1.31 - 2.62 ft/sec indicates limited community ambulator with speed of >1.8 ft/sec indicating reduced risk for recurrent falls DGI = 14/24, Scores of 19 or less are predicitve of falls in older community living adults.  Dynamic Gait Index  Level Surface Mild Impairment   Change in Gait Speed Mild Impairment   Gait with Horizontal Head Turns Moderate Impairment   Gait with Vertical Head Turns Mild Impairment   Gait and Pivot Turn Mild Impairment   Step Over Obstacle Moderate Impairment   Step Around Obstacles Mild Impairment   Steps Mild Impairment   Total Score 14   DGI comment: Scores of 19 or less are predicitve of falls in older community living adults.     LE MMT  Goal assessment  GAIT TRAINING: To normalize gait pattern and improve safety with RW .  220 ft with RW - repeated cues necessary for upright posture and improved RW proximity 90 ft with rollator - patient demonstrating worsening of forward flexed posture with poor rollator proximity resulting in decreased control - patient and PT in agreement that he is safer with  standard RW Stairs: Level of Assistance: SBA and CGA Stair Negotiation Technique: Alternating Pattern, Forwards, With use of AD: SPC with Single Rail on Right Number of Stairs: 14  Height of Stairs: 7"  Comments: Intermittent cues to ensure majority of foot fully placed on step without heel hanging off step to reduce risk for posterior loss of balance on ascent   11/12/23: Nustep: L5 6 min Side stepping along counter, 20' 4 x  Supine for manual stretch R plantarflexors and R hamstrings Supine bridging with black theraband around thighs for proximal input 15x Standing for toe/ heel rocks at counter Standing alt hip ext Knee ext 10# 2 x15 on BATCA Standing on airex, attempted static standing without hands, unable Lunges each LE on airex, 10 reps, tended to buckle  Adjusted r walker up for more appropriate height   11/10/23 THERAPEUTIC EXERCISE: To improve strength and endurance.  Demonstration, verbal and tactile cues throughout for technique.  NuStep L6 x 7 min (UE/LE - seat #12)  Counter squats 2x10 Stan[place] hip abduction x 10 BLE Standing marching x 10 BLE Standing hip extension x 10 BLE  GAIT TRAINING: To normalize gait pattern and improve safety. Gait with 4WW and 2 WW- decreased stride length, R foot catching x 2 90 ft with each,  cues for posture and to increase step length  10/26/23 THERAPEUTIC EXERCISE: To improve strength and endurance.  Demonstration, verbal and tactile cues throughout for technique.  NuStep L6 x 7 min (UE/LE - seat #12)   THERAPEUTIC ACTIVITIES: To improve functional performance.  Demonstration, verbal and tactile cues throughout for technique. Berg = 39/56; 37-45 indicates significant (>80%) fall risk  Berg Balance Test  Sit to Stand Able to stand without using hands and stabilize independently   Standing Unsupported Able to stand safely 2 minutes   Sitting with Back Unsupported but Feet Supported on Floor or Stool Able to sit safely and securely 2  minutes   Stand to Sit Sits safely with minimal use of hands   Transfers Able to transfer safely, minor use of hands   Standing Unsupported with Eyes Closed Able to stand 10 seconds safely   Standing Unsupported with Feet Together Able to place feet together independently and stand for 1 minute with supervision   knee prevent feet from touching  From Standing, Reach Forward with Outstretched Arm Can reach forward >12 cm safely (5")   From Standing Position, Pick up Object from Floor Able to pick up shoe, needs supervision   From Standing Position, Turn to Look Behind Over each Shoulder Needs supervision when turning   Turn 360 Degrees Needs close supervision or verbal cueing   Standing Unsupported, Alternately Place Feet on Step/Stool Able to complete >2 steps/needs minimal assist   Standing Unsupported, One Foot in Front Able to take small step independently and hold 30 seconds   Standing on One Leg Tries to lift leg/unable to hold 3 seconds but remains standing independently   Total Score 39   Berg comment: 37-45 = Significant (>80%) fall risk      NEUROMUSCULAR RE-EDUCATION: To improve balance, coordination, kinesthesia, posture, proprioception, and reduce fall risk.  Counter squat 2 x 10 - cues for posterior weight shift and even weight shift btw LEs    10/23/23 THERAPEUTIC EXERCISE: To improve strength, endurance, ROM, and flexibility.  Demonstration, verbal and tactile cues throughout for technique.  Rec Bike - L1 x 6 min - R foot repeated slipping off pedal  THERAPEUTIC ACTIVITIES: To improve functional performance.  Demonstration, verbal and tactile cues throughout for technique. 5xSTS: 13.79 sec (w/o UE assist) TUG: 21.03 sec with SPC, >13.5 sec indicates high risk for falls : 22.81 sec with SPC; 26.03 sec w/o AD Gait speed: 1.44 ft/sec with SPC; 1.26 ft/sec; gait speed of <1.8 ft/sec indicative of recurrent fall risk DGI: 10/24, Scores of 19 or less are predicitve of falls  in older community living adults. Dynamic Gait Index  Level Surface Mild Impairment   Change in Gait Speed Mild Impairment   Gait with Horizontal Head Turns Moderate Impairment   Gait with Vertical Head Turns Moderate Impairment   Gait and Pivot Turn Moderate Impairment   Step Over Obstacle Severe Impairment   Step Around Obstacles Mild Impairment   Steps Moderate Impairment   Total Score 10      GAIT TRAINING: To normalize gait pattern and improve safety with SPC .  200 ft with SPC - cues provided to try SPC in L hand to offset R LE (patient normally using cane in R hand) -cues necessary for proper sequencing as well as full symmetrical stride length Stairs: Level of Assistance: SBA and CGA Stair Negotiation Technique: Step to Pattern/Alternating Pattern Forwards With use of AD: SPC with Single Rail on Right Number of Stairs: 14  Height of Stairs: 7"  Comments: Cues to ensure full foot placement on step as patient has tendency to have forefoot/half of foot hanging over edge of step on descent > ascent Near fall due to LOB with SPC upon exiting stairwell, requiring PT assist to correct LOB/prevent fall   PATIENT EDUCATION:  Education details: progress with PT, ongoing PT POC, and gait safety with SPC   Person educated: Patient Education method: Explanation, Demonstration, and Verbal cues Education comprehension: verbalized understanding, returned demonstration, verbal cues required, and needs further education  HOME EXERCISE PROGRAM: Access Code: [email or web address] URL: [email or web address] Date: 10/26/2023 Prepared by: Glenetta Hew  Exercises - Seated Hamstring Stretch with Strap  - 2 x daily - 7 x weekly - 3 reps - 30 sec hold - Seated Hamstring Set  - 2 x daily - 7 x weekly - 2 sets - 10 reps - 3 sec hold - Seated Hip Adduction Isometrics with Ball  - 1 x daily - 7 x weekly - 2 sets - 10 reps - 3-5 sec hold - Seated Isometric Hip Abduction with Resistance  - 1 x daily  - 7 x weekly - 2 sets - 10 reps - 3 sec hold - Seated March with Resistance  - 1 x daily - 7 x weekly - 2 sets - 10 reps - 3 sec hold - Seated Hamstring Curls with Resistance  - 1 x daily - 7 x weekly - 2 sets - 10 reps - 3 sec hold - Seated Knee Extension with Resistance  - 1 x daily - 7 x weekly - 2 sets - 10 reps - 3 sec hold - Sit to Stand with Resistance Around Legs  - 1 x daily - 3-4 x weekly - 2 sets - 10 reps - Side Stepping with Resistance at Thighs and Counter Support  - 1 x daily - 3-4 x weekly - 2 sets - 10 reps - 3 sec hold - Squat with Chair and Counter Support  - 1 x daily - 3-4 x weekly - 2 sets - 10 reps - 3 sec hold   ASSESSMENT:  CLINICAL IMPRESSION:  Deanna continues to demonstrate improvement with PT.  He no longer reports any R thigh pain, however he continues to note chronic R knee pain which varies in intensity.  Standardized balance testing revealed a 4 point improvement on the DGI since the most recent assessment and 7 points from eval, although current score of 14/24 still 1 point shy of LTG and still indicative of high risk for falls.  He is demonstrating gains in B LE strength, however R LE remains weaker than L LE with greatest weakness proximally at hip.  Given ongoing weakness and impaired balance we have transitioned him from the [facility] to the RW. His gait speed with the RW has improved to 2.02 ft/sec with improved stability, reducing his risk for recurrent falls, but continued cues necessary for upright posture and RW proximity.  Gait with 4 WW/rollator assessed at patient's sister's request to determine if he should be obtaining a rollator for home use - patient demonstrates worsening of forward flexed posture and poor rollator proximity, with patient and PT in agreement that he is safer with standard RW.  Flynn has 1 visit remaining in his current [facility] Medicare authorization and would like to try to transition to his HEP following final visit, therefore will plan for review  and finalization of HEP next visit updated printouts provided to patient and his sister for home  use.  OBJECTIVE IMPAIRMENTS: Abnormal gait, decreased activity tolerance, decreased balance, decreased endurance, decreased knowledge of condition, decreased knowledge of use of DME, decreased mobility, difficulty walking, decreased ROM, decreased strength, decreased safety awareness, hypomobility, increased fascial restrictions, impaired perceived functional ability, increased muscle spasms, impaired flexibility, improper body mechanics, postural dysfunction, and pain.   ACTIVITY LIMITATIONS: carrying, lifting, bending, sitting, standing, squatting, sleeping, stairs, transfers, bed mobility, and locomotion level  PARTICIPATION LIMITATIONS: meal prep, cleaning, laundry, driving, shopping, community activity, and yard work  PERSONAL FACTORS: Age, Fitness, Past/current experiences, Time since onset of injury/illness/exacerbation, and 3+ comorbidities: L TKA 2020, R TKA 2017, OA, HTN, prostate cancer  are also affecting patient's functional outcome.   REHAB POTENTIAL: Good  CLINICAL DECISION MAKING: Evolving/moderate complexity  EVALUATION COMPLEXITY: Moderate   GOALS: Goals reviewed with patient? Yes  SHORT TERM GOALS: Target date: 09/28/2023  Patient will be independent with initial HEP. Baseline:  Goal status: MET - 11/17/23   2.  Patient will report at least 25% improvement in R thigh and knee pain to improve QOL. Baseline: 4/10 constant pain, at worst up to 7/10 Goal status: MET - 09/21/23 - Pt reports 60% improvement in pain since start of PT  3.  Complete standardized balance testing and update POC/goals as indicated. Baseline:  Goal status: MET - 09/07/23  4.  Patient will improve 5x STS time to </= 15 seconds to demonstrate improved functional strength and transfer efficiency  Baseline: 20.35 sec; 09/28/23 - 18 sec Goal status: MET - 10/23/23 - 13.79 sec  LONG TERM GOALS: Target date:  10/26/2023; extended to 12/04/2023  Patient will be independent with advanced/ongoing HEP to improve outcomes and carryover.  Baseline:  Goal status: IN PROGRESS - 11/17/23 - will plan for review next visit  2.  Patient will report at least 50-75% improvement in R thigh and knee pain to improve QOL. Baseline: 4/10 constant pain, at worst up to 7/10 Goal status: MET -  11/17/23 - Pt reports 80% improvement in pain since start of PT  3.  Patient will demonstrate improved R knee AROM to >/= 0-110 deg to allow for normal gait and stair mechanics. Baseline: R knee AROM 0-104 Goal status: MET - 10/06/23  4.  Patient will demonstrate improved B LE strength to >/= 4 to 4+/5 for improved stability and ease of mobility. Baseline: Refer to above LE MMT table Goal status: PARTIALLY MET - 11/17/23 - met for L LE except hip extension and IR 4-/5, R LE weaker than L LE for all muscle groups except knee extension but goal met for knee and ankle  5.  Patient will be able to ambulate with LRAD and normal gait pattern at gait speed of >/= 1.8 ft/sec without increased pain for safe limited community access with decreased risk for recurrent falls.  Baseline: 0.93 ft/sec with SPC; 10/23/23 - 1.44 ft/sec with SPC; 1.26 ft/sec w/o AD; better gait pattern and speed with RW Goal status: PARTIALLY MET - 11/17/23 - gait speed = 2.02 ft/sec with RW but cues still necessary for upright posture and RW proximity   6. Patient will be able to ascend/descend stairs with 1 HR and reciprocal step pattern safely to access home and community.  Baseline: Step-to/alternating pattern with SPC and 1 HR with CGA of PT  Goal status: MET - 11/17/23 - alternating pattern with SPC and 1 HR with CGA/SBA of PT  7.  Patient will report >/= 29/80 on LEFS (MCID = 9 pts) to demonstrate  improved functional ability. Baseline: 20 / 80 = 25.0 %; 09/25/23 - 39 / 80 = 48.8 % Goal status: MET - 10/26/23 - 42 / 80 = 52.5 %  8.  Patient will demonstrate at least  15/24 on DGI to decrease risk of falls. Baseline: 7/24; 10/23/23 - 10/24 Goal status: NOT MET - 11/17/23 - 14/24, 7 point improvement from baseline but remains 1 point shy of goal   PLAN:  PT FREQUENCY: 2x/week  PT DURATION: 8 weeks  PLANNED INTERVENTIONS: 97164- PT Re-evaluation, 97110-Therapeutic exercises, 97530- Therapeutic activity, 97112- Neuromuscular re-education, 97535- Self Care, 82956- Manual therapy, 97116- Gait training, [phone]- Aquatic Therapy, [phone]- Electrical stimulation (unattended), [phone]- Electrical stimulation (manual), 97016- Vasopneumatic device, [id number]- Ultrasound, [id number]- Ionotophoresis 4mg /ml Dexamethasone, Patient/Family education, Balance training, Stair training, Taping, Dry Needling, Joint mobilization, Scar mobilization, DME instructions, Cryotherapy, Moist heat, and 97750- Physical Performance Test or Measurement  PLAN FOR NEXT SESSION: Review gait safety with RW, emphasizing upright posture and good RW proximity - educate patient and his sister that if he would like a new RW, they can request an order from his PCP to have insurance cover the cost of the walker; review and update/consolidate HEP, providing updated handout to patient and his sister; complete assessment of LTG #1; anticipate transition to HEP with DC from PT  Marry Guan, PT 11/17/2023, 6:51 PM     Date of referral: 07/30/2023 Referring provider: Bill Salinas, PA-C Referring diagnosis? M25.561 (ICD-10-CM) - Right knee pain / R LE hamstring strain (biceps femoris) Treatment diagnosis? (if different than referring diagnosis)  Chronic pain of right knee  Pain in right thigh  Muscle weakness (generalized)  Other abnormalities of gait and mobility  Other muscle spasm  What was this (referring dx) caused by? Ongoing Issue and Arthritis  Ashby Dawes of Condition: Chronic (continuous duration > 3 months)   Laterality: Rt  Current Functional Measure Score: LEFS 42 / 80 = 52.5 %    Objective  measurements identify impairments when they are compared to normal values, the uninvolved extremity, and prior level of function.  [x]  Yes  []  No  Objective assessment of functional ability: Moderate functional limitations   Briefly describe symptoms:  Improvement noted in R thigh pain with PT and overall improvement in knee function/ROM, however ongoing anterior R knee discomfort.  Continued R>L proximal LE weakness.  Reassessment of standardized balance testing revealing gains across all tests with 5xSTS time of 13.79 seconds meeting STG #4, however remaining standardized balance tests indicating continued high risk for falls including TUG time of 21.03 sec with SPC (>13.5 sec indicates high risk for falls), 10MWT/gait speed of 1.44 ft/sec with SPC; 1.26 ft/sec; (gait speed of <1.8 ft/sec indicative of recurrent fall risk), DGI = 10/24 (scores of 19 or less are predicitve of falls in older community living adults), and Berg = 39/56 (37-45 indicates significant (>80%) fall risk).  These scores along with decreased safety noted with use of SPC indicate potential need for increased assistive device support - discussed findings with patient with recommendation that walker may be necessary if scores do not continue to improve.  Given above balance impairments and associated fall risk, as well as ongoing weakness identified at last visit, Christien will benefit from recertification for continued skilled PT to address ongoing deficits and impairments to improve mobility and activity tolerance with decreased pain interference and decreased risk for falls, therefore will recommend recert for additional 2x/wk for up to 4-6 weeks.   How did symptoms start:  uncertain, possibly muscle cramp  Average pain intensity:  Last 24 hours: 3/10  Past week: 3/10  How often does the pt experience symptoms? Intermittently  How much have the symptoms interfered with usual daily activities? Moderately  How has condition  changed since care began at this facility? Much better  In general, how is the patients overall health? Good  Onset date: ~6-8 months   BACK PAIN (STarT Back Screening Tool) - (When applicable): N/A  Has your back pain spread down your leg(s) at sometime in the last 2 weeks? []  Yes   []  No Have you had pain in the shoulder or neck at sometime in the past 2 weeks? []  Yes   []  No Have you only walked short distances because of your back pain? []  Yes   []  No In the past 2 weeks, have you dressed more slowly than usual because of your back pain? []  Yes   []  No Do you think it is not really safe for person with a condition like yours to be physically active? []  Yes   []  No Have worrying thoughts been going through your mind a lot of the time? []  Yes   []  No Do you feel that your back pain is terrible and it is never going to get any better? []  Yes   []  No In general, have you stopped enjoying all the things you usually enjoy? []  Yes   []  No Overall, how bothersome has your back pain been in the last 2 weeks? []  Not at all   []  Slightly     []  Moderate   []  Very much     []  Extremely

## 2023-11-19 ENCOUNTER — Ambulatory Visit

## 2023-11-24 ENCOUNTER — Ambulatory Visit

## 2023-11-24 DIAGNOSIS — M25561 Pain in right knee: Secondary | ICD-10-CM | POA: Diagnosis not present

## 2023-11-24 DIAGNOSIS — M79651 Pain in right thigh: Secondary | ICD-10-CM

## 2023-11-24 DIAGNOSIS — M6281 Muscle weakness (generalized): Secondary | ICD-10-CM

## 2023-11-24 DIAGNOSIS — R2689 Other abnormalities of gait and mobility: Secondary | ICD-10-CM

## 2023-11-24 DIAGNOSIS — M62838 Other muscle spasm: Secondary | ICD-10-CM

## 2023-11-24 DIAGNOSIS — G8929 Other chronic pain: Secondary | ICD-10-CM

## 2023-11-24 NOTE — Therapy (Addendum)
 OUTPATIENT PHYSICAL THERAPY TREATMENT     Patient Name: Seth [name] MRN: [id number] DOB:09/26/1940, 83 y.o., male Today's Date: 11/24/2023   END OF SESSION:  PT End of Session - 11/24/23 1554     Visit Number 14    Date for PT Re-Evaluation 12/04/23    Authorization Type [employer]    Authorization Time Period 11/03/23- 12/01/23    Authorization - Visit Number 4    Authorization - Number of Visits 4    Progress Note Due on Visit 19    PT Start Time 1502    PT Stop Time 1545    PT Time Calculation (min) 43 min    Activity Tolerance Patient tolerated treatment well    Behavior During Therapy [facility] for tasks assessed/performed                    Past Medical History:  Diagnosis Date   Arthritis    Cancer (HCC)    hx of prostate cancer    History of kidney stones    Hypertension    Pneumonia    06/2015    Past Surgical History:  Procedure Laterality Date   PROSTATECTOMY     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   Procedure: RIGHT TOTAL KNEE ARTHROPLASTY;  Surgeon: Liliane Rei, MD;  Location: [facility];  Service: Orthopedics;  Laterality: Right;   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   Procedure: TOTAL KNEE ARTHROPLASTY;  Surgeon: Liliane Rei, MD;  Location: [facility];  Service: Orthopedics;  Laterality: Left;    Patient Active Problem List   Diagnosis Date Noted   Sepsis (HCC) 04/13/2020   Pneumonia due to COVID-19 virus 04/13/2020   Acute hypoxemic respiratory failure (HCC) 04/13/2020   Elevated troponin 04/13/2020   Syncope 08/30/2018   Essential hypertension 08/30/2018   AKI (acute kidney injury) (HCC) 08/30/2018   Hyponatremia 08/30/2018   Volume depletion 08/30/2018   OA (osteoarthritis) of knee 09/10/2015    PCP: Drosinis, Kin Penner, PA-C   REFERRING PROVIDER: Valarie Garner / Liliane Rei, MD  REFERRING DIAG: [phone] (ICD-10-CM) - Right knee pain / R LE hamstring strain (biceps femoris)  THERAPY DIAG:  Chronic pain of  right knee  Pain in right thigh  Muscle weakness (generalized)  Other abnormalities of gait and mobility  Other muscle spasm  RATIONALE FOR EVALUATION AND TREATMENT: Rehabilitation  ONSET DATE: ~6-8 months  NEXT MD VISIT: 11/20/23   SUBJECTIVE:  SUBJECTIVE STATEMENT: Pt reports he is doing good, the same pain and he notes that his doctor told him he would draw fluid from it if it kept hurting.   EVAL:  Pt reports pain in R posterior lateral thigh for the past 6-8 months w/o known MOI.  He states he feels like at one point in time he had a "charley horse" in his R thigh.  He notes constant pain at ~4/10.  He states his R knee acts up at times since his TKA.  PAIN: Are you having pain? Yes: NPRS scale: 4/10   Pain location: R knee Pain description: tenderness  Aggravating factors: "just there" Relieving factors: nothing  PERTINENT HISTORY:  L TKA 2020, R TKA 2017, OA, HTN, prostate cancer  PRECAUTIONS: Fall  RED FLAGS: None  WEIGHT BEARING RESTRICTIONS: No  FALLS:  Has patient fallen in last 6 months? No, but FOF  LIVING ENVIRONMENT: Lives with: lives with their family Lives in: House/apartment Stairs: Yes: Internal: 14 steps; to basement (no need to access) and External: 2 steps; on right going up, on left going up, and can reach both Has following equipment at home: Single point cane and Grab bars  OCCUPATION: Retired  PLOF: Independent and Leisure: mostly sedentary - look at [employer], work in the yard when the weather is nicer  PATIENT GOALS: "To use my R leg completely."   OBJECTIVE: (objective measures completed at initial evaluation unless otherwise dated)  DIAGNOSTIC FINDINGS:  07/30/23 - Peripheral venous US - R LE: IMPRESSION: No evidence of deep vein  thrombosis.   PATIENT SURVEYS:  LEFS 20 / 80 = 25.0 %  COGNITION: Overall cognitive status: Within functional limits for tasks assessed    SENSATION: WFL  EDEMA:  N/A  POSTURE:  rounded shoulders, forward head, flexed trunk , and genu valgum bilaterally  PALPATION: Increased muscle tension and TTP along R lateral HS  MUSCLE LENGTH: Hamstrings: mild/mod tight R>L ITB: mod tight L>R Piriformis: WFL Hip IR: mod tight R>L Hip flexors: mod tight R>L Quads: mod tight R>L  LOWER EXTREMITY ROM:  Active ROM Right eval Left eval R 10/06/23 L 10/06/23  Knee flexion 104 110 110 110  Knee extension 0 0     Passive ROM Right eval Left eval  Knee flexion 110 110  Knee extension    (Blank rows = not tested)  LOWER EXTREMITY MMT:  MMT Right eval Left eval R 10/06/23 L 10/06/23 R 11/17/23 L 11/17/23  Hip flexion 3+ 4 4+ 4+ 4+ 5  Hip extension 2+ 3- 3+ 3+ 3+ 4-  Hip abduction 2+ 3+ 3+ 4- 4- 4  Hip adduction 3- 4- 3+ 4- 4- 4  Hip internal rotation 3- 4- 3+ 4- 3+ 4-  Hip external rotation 4- 4 4- 4+ 4 4+  Knee flexion 4- 4+ 4 5 4 5   Knee extension 4 4+ 4 p! 5 5 5   Ankle dorsiflexion 4- 4+ 4 5 4 5   Ankle plantarflexion        Ankle inversion        Ankle eversion         (Blank rows = not tested)  FUNCTIONAL TESTS:  5 times sit to stand: 20.35 sec (w/o UE assist), >15 sec indicates recurrent fall risk Timed up and go (TUG): 21.94 sec with SPC, >13.5 sec indicates high risk for falls 10 meter walk test: 35.09 sec with SPC, 0.93 ft/sec with SPC Berg Balance Scale: 37/56, 37-45 significant risk for falls (>80%)  Dynamic Gait Index: 7/24, Scores of 19 or less are predicitve of falls in older community living adults.  10/23/23: 5xSTS: 13.79 sec (w/o UE assist) TUG: 21.03 sec with SPC, >13.5 sec indicates high risk for falls : 22.81 sec with SPC; 26.03 sec w/o AD Gait speed: 1.44 ft/sec with SPC; 1.26 ft/sec DGI: 10/24, Scores of 19 or less are predicitve of falls in older  community living adults.  3/17/25Randye Buttner = 39/56; 37-45 indicates significant (>80%) fall risk  11/17/23: = 16.22 with RW Gait speed = 2.02 ft/sec DGI = 14/24, Scores of 19 or less are predicitve of falls in older community living adults.  GAIT: Distance walked: Clinic distances Assistive device utilized: Single point cane Level of assistance: Modified independence Gait pattern: decreased stride length, knee flexed in stance- Right, knee flexed in stance- Left, shuffling, trunk flexed, and narrow BOS Comments: Stooped posture with genu valgum during gait   TODAY'S TREATMENT:  11/24/23 Therapeutic Exercise: to improve strength, ROM, flexibility, and endurance  Nustep L5x85min HEP review and consolidation Many cues required to correct form and for proper movements during HEP review  11/17/23 PHYSICAL PERFORMANCE TEST or MEASUREMENT: = 16.22 with RW Gait speed = 2.02 ft/sec with RW, 1.31 - 2.62 ft/sec indicates limited community ambulator with speed of >1.8 ft/sec indicating reduced risk for recurrent falls DGI = 14/24, Scores of 19 or less are predicitve of falls in older community living adults.  Dynamic Gait Index  Level Surface Mild Impairment   Change in Gait Speed Mild Impairment   Gait with Horizontal Head Turns Moderate Impairment   Gait with Vertical Head Turns Mild Impairment   Gait and Pivot Turn Mild Impairment   Step Over Obstacle Moderate Impairment   Step Around Obstacles Mild Impairment   Steps Mild Impairment   Total Score 14   DGI comment: Scores of 19 or less are predicitve of falls in older community living adults.     LE MMT  Goal assessment  GAIT TRAINING: To normalize gait pattern and improve safety with RW .  220 ft with RW - repeated cues necessary for upright posture and improved RW proximity 90 ft with rollator - patient demonstrating worsening of forward flexed posture with poor rollator proximity resulting in decreased control - patient  and PT in agreement that he is safer with standard RW Stairs: Level of Assistance: SBA and CGA Stair Negotiation Technique: Alternating Pattern, Forwards, With use of AD: SPC with Single Rail on Right Number of Stairs: 14  Height of Stairs: 7"  Comments: Intermittent cues to ensure majority of foot fully placed on step without heel hanging off step to reduce risk for posterior loss of balance on ascent   11/12/23: Nustep: L5 6 min Side stepping along counter, 20' 4 x  Supine for manual stretch R plantarflexors and R hamstrings Supine bridging with black theraband around thighs for proximal input 15x Standing for toe/ heel rocks at counter Standing alt hip ext Knee ext 10# 2 x15 on BATCA Standing on airex, attempted static standing without hands, unable Lunges each LE on airex, 10 reps, tended to buckle  Adjusted r walker up for more appropriate height   11/10/23 THERAPEUTIC EXERCISE: To improve strength and endurance.  Demonstration, verbal and tactile cues throughout for technique.  NuStep L6 x 7 min (UE/LE - seat #12)  Counter squats 2x10 Stan[place] hip abduction x 10 BLE Standing marching x 10 BLE Standing hip extension x 10 BLE  GAIT TRAINING: To  normalize gait pattern and improve safety. Gait with 4WW and 2 WW- decreased stride length, R foot catching x 2 90 ft with each, cues for posture and to increase step length  10/26/23 THERAPEUTIC EXERCISE: To improve strength and endurance.  Demonstration, verbal and tactile cues throughout for technique.  NuStep L6 x 7 min (UE/LE - seat #12)   THERAPEUTIC ACTIVITIES: To improve functional performance.  Demonstration, verbal and tactile cues throughout for technique. Berg = 39/56; 37-45 indicates significant (>80%) fall risk  Berg Balance Test  Sit to Stand Able to stand without using hands and stabilize independently   Standing Unsupported Able to stand safely 2 minutes   Sitting with Back Unsupported but Feet Supported on Floor or  Stool Able to sit safely and securely 2 minutes   Stand to Sit Sits safely with minimal use of hands   Transfers Able to transfer safely, minor use of hands   Standing Unsupported with Eyes Closed Able to stand 10 seconds safely   Standing Unsupported with Feet Together Able to place feet together independently and stand for 1 minute with supervision   knee prevent feet from touching  From Standing, Reach Forward with Outstretched Arm Can reach forward >12 cm safely (5")   From Standing Position, Pick up Object from Floor Able to pick up shoe, needs supervision   From Standing Position, Turn to Look Behind Over each Shoulder Needs supervision when turning   Turn 360 Degrees Needs close supervision or verbal cueing   Standing Unsupported, Alternately Place Feet on Step/Stool Able to complete >2 steps/needs minimal assist   Standing Unsupported, One Foot in Front Able to take small step independently and hold 30 seconds   Standing on One Leg Tries to lift leg/unable to hold 3 seconds but remains standing independently   Total Score 39   Berg comment: 37-45 = Significant (>80%) fall risk      NEUROMUSCULAR RE-EDUCATION: To improve balance, coordination, kinesthesia, posture, proprioception, and reduce fall risk.  Counter squat 2 x 10 - cues for posterior weight shift and even weight shift btw LEs    10/23/23 THERAPEUTIC EXERCISE: To improve strength, endurance, ROM, and flexibility.  Demonstration, verbal and tactile cues throughout for technique.  Rec Bike - L1 x 6 min - R foot repeated slipping off pedal  THERAPEUTIC ACTIVITIES: To improve functional performance.  Demonstration, verbal and tactile cues throughout for technique. 5xSTS: 13.79 sec (w/o UE assist) TUG: 21.03 sec with SPC, >13.5 sec indicates high risk for falls : 22.81 sec with SPC; 26.03 sec w/o AD Gait speed: 1.44 ft/sec with SPC; 1.26 ft/sec; gait speed of <1.8 ft/sec indicative of recurrent fall risk DGI: 10/24,  Scores of 19 or less are predicitve of falls in older community living adults. Dynamic Gait Index  Level Surface Mild Impairment   Change in Gait Speed Mild Impairment   Gait with Horizontal Head Turns Moderate Impairment   Gait with Vertical Head Turns Moderate Impairment   Gait and Pivot Turn Moderate Impairment   Step Over Obstacle Severe Impairment   Step Around Obstacles Mild Impairment   Steps Moderate Impairment   Total Score 10      GAIT TRAINING: To normalize gait pattern and improve safety with SPC .  200 ft with SPC - cues provided to try SPC in L hand to offset R LE (patient normally using cane in R hand) -cues necessary for proper sequencing as well as full symmetrical stride length Stairs: Level of Assistance: SBA  and CGA Stair Negotiation Technique: Step to Pattern/Alternating Pattern Forwards With use of AD: SPC with Single Rail on Right Number of Stairs: 14  Height of Stairs: 7"  Comments: Cues to ensure full foot placement on step as patient has tendency to have forefoot/half of foot hanging over edge of step on descent > ascent Near fall due to LOB with SPC upon exiting stairwell, requiring PT assist to correct LOB/prevent fall   PATIENT EDUCATION:  Education details: progress with PT, ongoing PT POC, and gait safety with SPC   Person educated: Patient Education method: Explanation, Demonstration, and Verbal cues Education comprehension: verbalized understanding, returned demonstration, verbal cues required, and needs further education  HOME EXERCISE PROGRAM: Access Code: [email or web address] URL: [email or web address] Date: 10/26/2023 Prepared by: Seth [name]  Exercises - Seated Hamstring Stretch with Strap  - 2 x daily - 7 x weekly - 3 reps - 30 sec hold - Seated Hamstring Set  - 2 x daily - 7 x weekly - 2 sets - 10 reps - 3 sec hold - Seated Hip Adduction Isometrics with Ball  - 1 x daily - 7 x weekly - 2 sets - 10 reps - 3-5 sec hold - Seated  Isometric Hip Abduction with Resistance  - 1 x daily - 7 x weekly - 2 sets - 10 reps - 3 sec hold - Seated March with Resistance  - 1 x daily - 7 x weekly - 2 sets - 10 reps - 3 sec hold - Seated Hamstring Curls with Resistance  - 1 x daily - 7 x weekly - 2 sets - 10 reps - 3 sec hold - Seated Knee Extension with Resistance  - 1 x daily - 7 x weekly - 2 sets - 10 reps - 3 sec hold - Sit to Stand with Resistance Around Legs  - 1 x daily - 3-4 x weekly - 2 sets - 10 reps - Side Stepping with Resistance at Thighs and Counter Support  - 1 x daily - 3-4 x weekly - 2 sets - 10 reps - 3 sec hold - Squat with Chair and Counter Support  - 1 x daily - 3-4 x weekly - 2 sets - 10 reps - 3 sec hold   ASSESSMENT:  CLINICAL IMPRESSION: Today we reviewed HEP in preparation for D/C. [place] required many cues to correct his form with the exercises. He showed a good understanding of his HEP after cuing. We reviewed exercises to avoid doing everyday and options for the best routine to work for him.  Him and his sister are in agreement with D/C as of today, d/t insurance limitations.  OBJECTIVE IMPAIRMENTS: Abnormal gait, decreased activity tolerance, decreased balance, decreased endurance, decreased knowledge of condition, decreased knowledge of use of DME, decreased mobility, difficulty walking, decreased ROM, decreased strength, decreased safety awareness, hypomobility, increased fascial restrictions, impaired perceived functional ability, increased muscle spasms, impaired flexibility, improper body mechanics, postural dysfunction, and pain.   ACTIVITY LIMITATIONS: carrying, lifting, bending, sitting, standing, squatting, sleeping, stairs, transfers, bed mobility, and locomotion level  PARTICIPATION LIMITATIONS: meal prep, cleaning, laundry, driving, shopping, community activity, and yard work  PERSONAL FACTORS: Age, Fitness, Past/current experiences, Time since onset of injury/illness/exacerbation, and 3+  comorbidities: L TKA 2020, R TKA 2017, OA, HTN, prostate cancer  are also affecting patient's functional outcome.   REHAB POTENTIAL: Good  CLINICAL DECISION MAKING: Evolving/moderate complexity  EVALUATION COMPLEXITY: Moderate   GOALS: Goals reviewed with patient? Yes  SHORT TERM GOALS: Target date: 09/28/2023  Patient will be independent with initial HEP. Baseline:  Goal status: MET - 11/17/23   2.  Patient will report at least 25% improvement in R thigh and knee pain to improve QOL. Baseline: 4/10 constant pain, at worst up to 7/10 Goal status: MET - 09/21/23 - Pt reports 60% improvement in pain since start of PT  3.  Complete standardized balance testing and update POC/goals as indicated. Baseline:  Goal status: MET - 09/07/23  4.  Patient will improve 5x STS time to </= 15 seconds to demonstrate improved functional strength and transfer efficiency  Baseline: 20.35 sec; 09/28/23 - 18 sec Goal status: MET - 10/23/23 - 13.79 sec  LONG TERM GOALS: Target date: 10/26/2023; extended to 12/04/2023  Patient will be independent with advanced/ongoing HEP to improve outcomes and carryover.  Baseline:  Goal status: MET - 415/25  2.  Patient will report at least 50-75% improvement in R thigh and knee pain to improve QOL. Baseline: 4/10 constant pain, at worst up to 7/10 Goal status: MET -  11/17/23 - Pt reports 80% improvement in pain since start of PT  3.  Patient will demonstrate improved R knee AROM to >/= 0-110 deg to allow for normal gait and stair mechanics. Baseline: R knee AROM 0-104 Goal status: MET - 10/06/23  4.  Patient will demonstrate improved B LE strength to >/= 4 to 4+/5 for improved stability and ease of mobility. Baseline: Refer to above LE MMT table Goal status: PARTIALLY MET - 11/17/23 - met for L LE except hip extension and IR 4-/5, R LE weaker than L LE for all muscle groups except knee extension but goal met for knee and ankle  5.  Patient will be able to ambulate  with LRAD and normal gait pattern at gait speed of >/= 1.8 ft/sec without increased pain for safe limited community access with decreased risk for recurrent falls.  Baseline: 0.93 ft/sec with SPC; 10/23/23 - 1.44 ft/sec with SPC; 1.26 ft/sec w/o AD; better gait pattern and speed with RW Goal status: PARTIALLY MET - 11/17/23 - gait speed = 2.02 ft/sec with RW but cues still necessary for upright posture and RW proximity   6. Patient will be able to ascend/descend stairs with 1 HR and reciprocal step pattern safely to access home and community.  Baseline: Step-to/alternating pattern with SPC and 1 HR with CGA of PT  Goal status: MET - 11/17/23 - alternating pattern with SPC and 1 HR with CGA/SBA of PT  7.  Patient will report >/= 29/80 on LEFS (MCID = 9 pts) to demonstrate improved functional ability. Baseline: 20 / 80 = 25.0 %; 09/25/23 - 39 / 80 = 48.8 % Goal status: MET - 10/26/23 - 42 / 80 = 52.5 %  8.  Patient will demonstrate at least 15/24 on DGI to decrease risk of falls. Baseline: 7/24; 10/23/23 - 10/24 Goal status: NOT MET - 11/17/23 - 14/24, 7 point improvement from baseline but remains 1 point shy of goal   PLAN:  PT FREQUENCY: 2x/week  PT DURATION: 8 weeks  PLANNED INTERVENTIONS: 97164- PT Re-evaluation, 97110-Therapeutic exercises, 97530- Therapeutic activity, 97112- Neuromuscular re-education, 97535- Self Care, 16109- Manual therapy, 97116- Gait training, [phone]- Aquatic Therapy, [phone]- Electrical stimulation (unattended), [phone]- Electrical stimulation (manual), 97016- Vasopneumatic device, [id number]- Ultrasound, [id number]- Ionotophoresis 4mg /ml Dexamethasone, Patient/Family education, Balance training, Stair training, Taping, Dry Needling, Joint mobilization, Scar mobilization, DME instructions, Cryotherapy, Moist heat, and 97750- Physical Performance Test or Measurement  PLAN FOR NEXT SESSION: Review  gait safety with RW, emphasizing upright posture and good RW proximity - educate patient and his  sister that if he would like a new RW, they can request an order from his PCP to have insurance cover the cost of the walker; review and update/consolidate HEP, providing updated handout to patient and his sister; complete assessment of LTG #1; anticipate transition to HEP with DC from PT  Seth [name], PTA 11/24/2023, 4:44 PM     Date of referral: 07/30/2023 Referring provider: Bill Salinas, PA-C Referring diagnosis? M25.561 (ICD-10-CM) - Right knee pain / R LE hamstring strain (biceps femoris) Treatment diagnosis? (if different than referring diagnosis)  Chronic pain of right knee  Pain in right thigh  Muscle weakness (generalized)  Other abnormalities of gait and mobility  Other muscle spasm  What was this (referring dx) caused by? Ongoing Issue and Arthritis  Ashby Dawes of Condition: Chronic (continuous duration > 3 months)   Laterality: Rt  Current Functional Measure Score: LEFS 42 / 80 = 52.5 %    Objective measurements identify impairments when they are compared to normal values, the uninvolved extremity, and prior level of function.  [x]  Yes  []  No  Objective assessment of functional ability: Moderate functional limitations   Briefly describe symptoms:  Improvement noted in R thigh pain with PT and overall improvement in knee function/ROM, however ongoing anterior R knee discomfort.  Continued R>L proximal LE weakness.  Reassessment of standardized balance testing revealing gains across all tests with 5xSTS time of 13.79 seconds meeting STG #4, however remaining standardized balance tests indicating continued high risk for falls including TUG time of 21.03 sec with SPC (>13.5 sec indicates high risk for falls), 10MWT/gait speed of 1.44 ft/sec with SPC; 1.26 ft/sec; (gait speed of <1.8 ft/sec indicative of recurrent fall risk), DGI = 10/24 (scores of 19 or less are predicitve of falls in older community living adults), and Berg = 39/56 (37-45 indicates significant  (>80%) fall risk).  These scores along with decreased safety noted with use of SPC indicate potential need for increased assistive device support - discussed findings with patient with recommendation that walker may be necessary if scores do not continue to improve.  Given above balance impairments and associated fall risk, as well as ongoing weakness identified at last visit, Tarron will benefit from recertification for continued skilled PT to address ongoing deficits and impairments to improve mobility and activity tolerance with decreased pain interference and decreased risk for falls, therefore will recommend recert for additional 2x/wk for up to 4-6 weeks.   How did symptoms start: uncertain, possibly muscle cramp  Average pain intensity:  Last 24 hours: 3/10  Past week: 3/10  How often does the pt experience symptoms? Intermittently  How much have the symptoms interfered with usual daily activities? Moderately  How has condition changed since care began at this facility? Much better  In general, how is the patients overall health? Good  Onset date: ~6-8 months   BACK PAIN (STarT Back Screening Tool) - (When applicable): N/A  Has your back pain spread down your leg(s) at sometime in the last 2 weeks? []  Yes   []  No Have you had pain in the shoulder or neck at sometime in the past 2 weeks? []  Yes   []  No Have you only walked short distances because of your back pain? []  Yes   []  No In the past 2 weeks, have you dressed more slowly than usual because of your back pain? []   Yes   []  No Do you think it is not really safe for person with a condition like yours to be physically active? []  Yes   []  No Have worrying thoughts been going through your mind a lot of the time? []  Yes   []  No Do you feel that your back pain is terrible and it is never going to get any better? []  Yes   []  No In general, have you stopped enjoying all the things you usually enjoy? []  Yes   []  No Overall, how  bothersome has your back pain been in the last 2 weeks? []  Not at all   []  Slightly     []  Moderate   []  Very much     []  Extremely

## 2023-11-26 ENCOUNTER — Encounter: Admitting: Physical Therapy

## 2023-12-01 ENCOUNTER — Encounter

## 2023-12-03 ENCOUNTER — Ambulatory Visit: Admitting: Physical Therapy

## 2024-01-06 ENCOUNTER — Other Ambulatory Visit (HOSPITAL_COMMUNITY): Payer: Self-pay | Admitting: Orthopedic Surgery

## 2024-01-06 DIAGNOSIS — Z96651 Presence of right artificial knee joint: Secondary | ICD-10-CM

## 2024-01-13 ENCOUNTER — Encounter (HOSPITAL_COMMUNITY)
Admission: RE | Admit: 2024-01-13 | Discharge: 2024-01-13 | Disposition: A | Discharge status: Home / Self Care | Source: Ambulatory Visit | Attending: Orthopedic Surgery | Admitting: Orthopedic Surgery

## 2024-01-13 DIAGNOSIS — Z96651 Presence of right artificial knee joint: Secondary | ICD-10-CM | POA: Diagnosis present

## 2024-01-13 MED ORDER — TECHNETIUM TC 99M MEDRONATE IV KIT
19.6000 | PACK | Freq: Once | INTRAVENOUS | Status: AC
  Administered 2024-01-13: 19.6 via INTRAVENOUS

## 2024-08-05 ENCOUNTER — Encounter (HOSPITAL_BASED_OUTPATIENT_CLINIC_OR_DEPARTMENT_OTHER): Payer: Self-pay

## 2024-08-05 ENCOUNTER — Other Ambulatory Visit: Payer: Self-pay

## 2024-08-05 ENCOUNTER — Emergency Department (HOSPITAL_BASED_OUTPATIENT_CLINIC_OR_DEPARTMENT_OTHER)
Admission: EM | Admit: 2024-08-05 | Discharge: 2024-08-05 | Disposition: A | Discharge status: Home / Self Care | Attending: Emergency Medicine | Admitting: Emergency Medicine

## 2024-08-05 DIAGNOSIS — L309 Dermatitis, unspecified: Secondary | ICD-10-CM | POA: Diagnosis present

## 2024-08-05 DIAGNOSIS — L209 Atopic dermatitis, unspecified: Secondary | ICD-10-CM

## 2024-08-05 MED ORDER — TRIAMCINOLONE ACETONIDE 0.1 % EX CREA: 1.0000 | TOPICAL_CREAM | Freq: Two times a day (BID) | CUTANEOUS | 0 refills | Status: AC

## 2024-08-05 MED ORDER — DEXAMETHASONE SOD PHOSPHATE PF 10 MG/ML IJ SOLN
10.0000 mg | Freq: Once | INTRAMUSCULAR | Status: AC
  Administered 2024-08-05: 10 mg via INTRAMUSCULAR

## 2024-08-05 NOTE — ED Provider Notes (Signed)
 " [facility] Provider Note   CSN: [id number] Arrival date & time: 08/05/24  9149     Patient presents with: Skin Problem   Seth [name] is a 83 y.o. male who presents emergency department with chief complaint of eczema flare he has a longstanding history of eczema.  He denies any fevers chills redness.  Patient states that he had difficulty sleeping last night due to itching all the time.  He is seen dermatology in the past.  Patient reports that he would just like some relief he is asking for a shot he is not diabetic and he needs a refill on some topical treatment.   HPI     Prior to Admission medications  Medication Sig Start Date End Date Taking? Authorizing Provider  albuterol  (PROVENTIL  HFA;VENTOLIN  HFA) 108 (90 Base) MCG/ACT inhaler Inhale 1-2 puffs into the lungs every 6 (six) hours as needed for wheezing or shortness of breath. 11/08/17   Tegeler, Lonni PARAS, MD  aspirin  EC 81 MG tablet Take 1 tablet (81 mg total) by mouth daily. Swallow whole. 04/14/20   Ghimire, Donalda HERO, MD  Brinzolamide-Brimonidine [facility]) 1-0.2 % SUSP Place 1 drop into both eyes 2 (two) times daily.    [provider]  loratadine  (CLARITIN ) 10 MG tablet Take 10 mg by mouth daily. 03/26/20   [provider]  METOPROLOL  SUCCINATE ER PO Take 25 mg by mouth daily.    [provider]  predniSONE  (DELTASONE ) 10 MG tablet Take 40 mg daily for 1 day, 30 mg daily for 1 day, 20 mg daily for 1 days,10 mg daily for 1 day, then stop 04/14/20   Ghimire, Donalda HERO, MD  timolol  (BETIMOL ) 0.5 % ophthalmic solution Place 1 drop into both eyes daily.    [provider]  Travoprost, BAK Free, (TRAVATAN) 0.004 % SOLN ophthalmic solution Place 1 drop into both eyes at bedtime.    [provider]    Allergies: Patient has no known allergies.    Review of Systems  Updated Vital Signs BP 108/80 (BP Location: Right Arm)   Pulse 95    Temp 98.1 F (36.7 C) (Oral)   Resp 16   Ht 5' 11 (1.803 m)   Wt 108.9 kg   SpO2 95%   BMI 33.47 kg/m   Physical Exam Vitals and nursing note reviewed.  Constitutional:      General: He is not in acute distress.    Appearance: He is well-developed. He is not diaphoretic.  HENT:     Head: Normocephalic and atraumatic.  Eyes:     General: No scleral icterus.    Conjunctiva/sclera: Conjunctivae normal.  Cardiovascular:     Rate and Rhythm: Normal rate and regular rhythm.     Heart sounds: Normal heart sounds.  Pulmonary:     Effort: Pulmonary effort is normal. No respiratory distress.     Breath sounds: Normal breath sounds.  Abdominal:     Palpations: Abdomen is soft.     Tenderness: There is no abdominal tenderness.  Musculoskeletal:     Cervical back: Normal range of motion and neck supple.  Skin:    General: Skin is warm and dry.     Comments: Multiple patches of hypertrophic thickened dry scaly skin predominantly around the neck flexural surfaces.  No signs of secondary infection  Neurological:     Mental Status: He is alert.  Psychiatric:        Behavior: Behavior  normal.     (all labs ordered are listed, but only abnormal results are displayed) Labs Reviewed - No data to display  EKG: None  Radiology: No results found.   Procedures   Medications Ordered in the ED - No data to display                                  Medical Decision Making  Patient here with longstanding history of eczema.  Increased itching and discomfort.  Patient given a shot of Decadron  here will discharge with Kenalog .  He may follow closely with primary care.  Appropriate for discharge at this time.     Final diagnoses:  None    ED Discharge Orders     None          Arloa Chroman, PA-C 08/05/24 0935    Ruthe Cornet, DO 08/05/24 [phone]  "

## 2024-08-05 NOTE — Discharge Instructions (Signed)
 General instructions Take or apply your medicines only as told. Wear clothes made of cotton or cotton blends. Dress lightly to avoid itching that can be caused by heat. When doing laundry, rinse your clothes twice to remove all soap. Use soap that doesn't have dyes and perfumes. Avoid triggers that cause flare-ups. Avoid scratching. It can make the rash and itching worse and can lead to infection. Keep fingernails short to avoid scratching open the skin. Avoid people who have cold sores or fever blisters. These infections can make your condition worse. Keep all follow-up visits to make sure your treatment plan is working. Contact a health care provider if: Your itching affects your sleep. Your rash gets worse or doesn't get better after a week of treatment. You have a fever. You have a rash after being around someone with cold sores or fever blisters. You have warmth or pus in the rash area. You have soft yellow scabs in the rash area.

## 2024-08-05 NOTE — ED Triage Notes (Signed)
 Pt reporting having an exema flare-up. Itching all over the body, but worst in the neck area. No relieving factors.
